# Patient Record
Sex: Female | Born: 1945 | Race: White | Hispanic: No | Smoking: Never smoker
Health system: Southern US, Community
[De-identification: ages and names within clinical notes are randomized; demographics above are authoritative.]

## PROBLEM LIST (undated history)

## (undated) ENCOUNTER — Ambulatory Visit

## (undated) DIAGNOSIS — E785 Hyperlipidemia, unspecified: Secondary | ICD-10-CM

## (undated) DIAGNOSIS — I10 Essential (primary) hypertension: Secondary | ICD-10-CM

## (undated) DIAGNOSIS — E119 Type 2 diabetes mellitus without complications: Secondary | ICD-10-CM

## (undated) HISTORY — DX: Type 2 diabetes mellitus without complications: E11.9

## (undated) HISTORY — DX: Essential (primary) hypertension: I10

## (undated) HISTORY — DX: Hyperlipidemia, unspecified: E78.5

---

## 1998-05-22 ENCOUNTER — Encounter: Admission: RE | Admit: 1998-05-22 | Discharge: 1998-05-22 | Payer: Self-pay | Admitting: *Deleted

## 1998-05-22 ENCOUNTER — Emergency Department (HOSPITAL_COMMUNITY): Admission: EM | Admit: 1998-05-22 | Discharge: 1998-05-22 | Payer: Self-pay

## 1998-05-23 ENCOUNTER — Encounter: Admission: RE | Admit: 1998-05-23 | Discharge: 1998-08-21 | Payer: Self-pay | Admitting: *Deleted

## 1998-05-31 ENCOUNTER — Encounter: Admission: RE | Admit: 1998-05-31 | Discharge: 1998-08-29 | Payer: Self-pay | Admitting: *Deleted

## 2012-05-04 LAB — HM DEXA SCAN: HM Dexa Scan: NORMAL

## 2014-08-14 LAB — HM MAMMOGRAPHY

## 2014-09-24 ENCOUNTER — Ambulatory Visit (INDEPENDENT_AMBULATORY_CARE_PROVIDER_SITE_OTHER): Payer: Commercial Managed Care - HMO | Admitting: Family Medicine

## 2014-09-24 ENCOUNTER — Encounter: Payer: Self-pay | Admitting: Family Medicine

## 2014-09-24 VITALS — BP 125/53 | HR 77 | Temp 97.5°F | Ht 61.5 in | Wt 170.0 lb

## 2014-09-24 DIAGNOSIS — H409 Unspecified glaucoma: Secondary | ICD-10-CM | POA: Insufficient documentation

## 2014-09-24 DIAGNOSIS — E039 Hypothyroidism, unspecified: Secondary | ICD-10-CM | POA: Insufficient documentation

## 2014-09-24 DIAGNOSIS — E119 Type 2 diabetes mellitus without complications: Secondary | ICD-10-CM | POA: Diagnosis not present

## 2014-09-24 DIAGNOSIS — E038 Other specified hypothyroidism: Secondary | ICD-10-CM | POA: Diagnosis not present

## 2014-09-24 DIAGNOSIS — H9193 Unspecified hearing loss, bilateral: Secondary | ICD-10-CM

## 2014-09-24 DIAGNOSIS — E785 Hyperlipidemia, unspecified: Secondary | ICD-10-CM | POA: Insufficient documentation

## 2014-09-24 DIAGNOSIS — E1165 Type 2 diabetes mellitus with hyperglycemia: Secondary | ICD-10-CM | POA: Insufficient documentation

## 2014-09-24 DIAGNOSIS — K219 Gastro-esophageal reflux disease without esophagitis: Secondary | ICD-10-CM | POA: Insufficient documentation

## 2014-09-24 DIAGNOSIS — Z8249 Family history of ischemic heart disease and other diseases of the circulatory system: Secondary | ICD-10-CM

## 2014-09-24 DIAGNOSIS — H9190 Unspecified hearing loss, unspecified ear: Secondary | ICD-10-CM | POA: Insufficient documentation

## 2014-09-24 DIAGNOSIS — IMO0002 Reserved for concepts with insufficient information to code with codable children: Secondary | ICD-10-CM | POA: Insufficient documentation

## 2014-09-24 DIAGNOSIS — I10 Essential (primary) hypertension: Secondary | ICD-10-CM

## 2014-09-24 NOTE — Progress Notes (Signed)
   Subjective:    Patient ID: Yolanda Fields, female    DOB: 12-24-1945, 69 y.o.   MRN: [id number]  HPI Diabetes - no hypoglycemic events. No wounds or sores that are not healing well. No increased thirst or urination. Checking glucose at home. Taking medications as prescribed without any side effects. Hasn't been exercising as much. She's currently on a commission dry to metformin. Her insurance will no longer cover this but they will cover the comminution of glipizide and metformin.  Hypertension- Pt denies chest pain, SOB, dizziness, or heart palpitations.  Taking meds as directed w/o problems.  Denies medication side effects.  She is on amlodipine 10 mg, Lasix 40 mg as needed and losartan 25 mg daily and metoprolol 25 mg.  She says that when she saw her doctor over the summer they have discussed the fact that her brother had a abdominal aneurysm which required surgery and he eventually died postop. She would like to be screened for this. She has never been a smoker.  Hypothyroid - has been on thyroid medication for 3 days.    Glaucoma- she is followedy by Dr. Kenton Fields and is on drops.    Hearing loss -wears hearing aid.    Review of Systems     Objective:   Physical Exam  Constitutional: She is oriented to person, place, and time. She appears well-developed and well-nourished.  HENT:  Head: Normocephalic and atraumatic.  Cardiovascular: Normal rate, regular rhythm and normal heart sounds.   No carotid bruits  Pulmonary/Chest: Effort normal and breath sounds normal.  Musculoskeletal: She exhibits no edema.  Neurological: She is alert and oriented to person, place, and time.  Skin: Skin is warm and dry.  Psychiatric: She has a normal mood and affect. Her behavior is normal.          Assessment & Plan:  DM- Still takes her cinnamon.  She is on statin and ARB. She said her last A1c was about 2 months ago. We will go through her labs and records to get a copy of this. She says it  was okay but up a little bit from previous. I will see her back in about 6 weeks for the next A1c.  HTN - well controlled. Continue current regimen.  Hypothyroid - well controlled on current regimen.  She's been getting her thyroid checked every 6 months. Next  Hearing loss-she does wear hearing aids and had her left one in today.  Glaucoma-she's followed by Dr. Aline Fields currently uses drops. She is also being followed for cataracts as well.  Family history of abdominal aneurysm-will screen with ultrasound the aorta and consider echocardiogram as well.

## 2014-09-25 ENCOUNTER — Encounter: Payer: Self-pay | Admitting: Family Medicine

## 2014-09-25 DIAGNOSIS — I10 Essential (primary) hypertension: Secondary | ICD-10-CM | POA: Insufficient documentation

## 2014-09-25 MED ORDER — GLIPIZIDE-METFORMIN HCL 5-500 MG PO TABS: 1.0000 | ORAL_TABLET | Freq: Two times a day (BID) | ORAL | Status: DC

## 2014-11-05 ENCOUNTER — Encounter: Payer: Self-pay | Admitting: Family Medicine

## 2014-11-05 ENCOUNTER — Ambulatory Visit (INDEPENDENT_AMBULATORY_CARE_PROVIDER_SITE_OTHER): Payer: Commercial Managed Care - HMO | Admitting: Family Medicine

## 2014-11-05 ENCOUNTER — Other Ambulatory Visit: Payer: Self-pay | Admitting: *Deleted

## 2014-11-05 VITALS — BP 122/78 | HR 110 | Ht 62.0 in | Wt 168.0 lb

## 2014-11-05 DIAGNOSIS — E038 Other specified hypothyroidism: Secondary | ICD-10-CM | POA: Diagnosis not present

## 2014-11-05 DIAGNOSIS — R110 Nausea: Secondary | ICD-10-CM

## 2014-11-05 DIAGNOSIS — R42 Dizziness and giddiness: Secondary | ICD-10-CM

## 2014-11-05 DIAGNOSIS — I10 Essential (primary) hypertension: Secondary | ICD-10-CM

## 2014-11-05 DIAGNOSIS — E119 Type 2 diabetes mellitus without complications: Secondary | ICD-10-CM | POA: Diagnosis not present

## 2014-11-05 DIAGNOSIS — E785 Hyperlipidemia, unspecified: Secondary | ICD-10-CM | POA: Diagnosis not present

## 2014-11-05 LAB — COMPLETE METABOLIC PANEL WITH GFR
ALT: 11 U/L (ref 0–35)
AST: 14 U/L (ref 0–37)
Albumin: 4.2 g/dL (ref 3.5–5.2)
Alkaline Phosphatase: 85 U/L (ref 39–117)
BUN: 31 mg/dL — ABNORMAL HIGH (ref 6–23)
CO2: 25 mEq/L (ref 19–32)
Calcium: 9.4 mg/dL (ref 8.4–10.5)
Chloride: 100 mEq/L (ref 96–112)
Creat: 1.19 mg/dL — ABNORMAL HIGH (ref 0.50–1.10)
GFR, Est African American: 53 mL/min — ABNORMAL LOW
GFR, Est Non African American: 46 mL/min — ABNORMAL LOW
Glucose, Bld: 218 mg/dL — ABNORMAL HIGH (ref 70–99)
Potassium: 4.3 mEq/L (ref 3.5–5.3)
Sodium: 136 mEq/L (ref 135–145)
Total Bilirubin: 1.4 mg/dL — ABNORMAL HIGH (ref 0.2–1.2)
Total Protein: 7 g/dL (ref 6.0–8.3)

## 2014-11-05 LAB — POCT GLYCOSYLATED HEMOGLOBIN (HGB A1C): Hemoglobin A1C: 9

## 2014-11-05 LAB — POCT UA - MICROALBUMIN
Albumin/Creatinine Ratio, Urine, POC: <30
Creatinine, POC: 200 mg/dL
Microalbumin Ur, POC: 30 mg/L

## 2014-11-05 MED ORDER — LOSARTAN POTASSIUM 100 MG PO TABS: 100.0000 mg | ORAL_TABLET | Freq: Every day | ORAL | Status: DC

## 2014-11-05 MED ORDER — AMLODIPINE BESYLATE 10 MG PO TABS: 10.0000 mg | ORAL_TABLET | Freq: Every day | ORAL | Status: DC
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
## 2014-11-06 ENCOUNTER — Other Ambulatory Visit: Payer: Self-pay | Admitting: Family Medicine

## 2014-11-06 ENCOUNTER — Encounter: Payer: Self-pay | Admitting: Family Medicine

## 2014-11-06 ENCOUNTER — Ambulatory Visit: Payer: Commercial Managed Care - HMO

## 2014-11-06 DIAGNOSIS — N183 Chronic kidney disease, stage 3 (moderate): Secondary | ICD-10-CM

## 2014-11-06 DIAGNOSIS — N1831 Chronic kidney disease, stage 3a: Secondary | ICD-10-CM | POA: Insufficient documentation

## 2014-11-06 DIAGNOSIS — N1832 Chronic kidney disease, stage 3b: Secondary | ICD-10-CM | POA: Insufficient documentation

## 2014-11-06 LAB — TSH: TSH: 2.083 u[IU]/mL (ref 0.350–4.500)

## 2014-11-06 MED ORDER — DULAGLUTIDE 0.75 MG/0.5ML ~~LOC~~ SOAJ: 0.7500 mg | SUBCUTANEOUS | Status: DC

## 2014-11-07 ENCOUNTER — Ambulatory Visit (INDEPENDENT_AMBULATORY_CARE_PROVIDER_SITE_OTHER): Payer: Commercial Managed Care - HMO

## 2014-11-07 ENCOUNTER — Telehealth: Payer: Self-pay | Admitting: Family Medicine

## 2014-11-07 DIAGNOSIS — J320 Chronic maxillary sinusitis: Secondary | ICD-10-CM | POA: Diagnosis not present
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
## 2014-11-08 ENCOUNTER — Other Ambulatory Visit: Payer: Self-pay | Admitting: Family Medicine

## 2014-11-08 MED ORDER — DOXYCYCLINE HYCLATE 100 MG PO TABS: 100.0000 mg | ORAL_TABLET | Freq: Two times a day (BID) | ORAL | Status: DC

## 2014-11-28 ENCOUNTER — Other Ambulatory Visit: Payer: Self-pay | Admitting: Family Medicine

## 2014-11-28 MED ORDER — ATORVASTATIN CALCIUM 10 MG PO TABS: 10.0000 mg | ORAL_TABLET | Freq: Every day | ORAL | Status: DC

## 2015-01-08 ENCOUNTER — Other Ambulatory Visit: Payer: Self-pay | Admitting: Family Medicine

## 2015-02-04 ENCOUNTER — Ambulatory Visit (INDEPENDENT_AMBULATORY_CARE_PROVIDER_SITE_OTHER): Payer: Commercial Managed Care - HMO | Admitting: Family Medicine

## 2015-02-04 ENCOUNTER — Encounter: Payer: Self-pay | Admitting: Family Medicine

## 2015-02-04 VITALS — BP 136/72 | HR 106 | Ht 62.0 in | Wt 160.0 lb

## 2015-02-04 DIAGNOSIS — I10 Essential (primary) hypertension: Secondary | ICD-10-CM | POA: Diagnosis not present

## 2015-02-04 DIAGNOSIS — E1165 Type 2 diabetes mellitus with hyperglycemia: Secondary | ICD-10-CM | POA: Diagnosis not present

## 2015-02-04 DIAGNOSIS — E538 Deficiency of other specified B group vitamins: Secondary | ICD-10-CM | POA: Diagnosis not present

## 2015-02-04 DIAGNOSIS — E559 Vitamin D deficiency, unspecified: Secondary | ICD-10-CM

## 2015-02-04 DIAGNOSIS — IMO0002 Reserved for concepts with insufficient information to code with codable children: Secondary | ICD-10-CM

## 2015-02-04 LAB — BASIC METABOLIC PANEL WITH GFR
BUN: 26 mg/dL — ABNORMAL HIGH (ref 6–23)
CO2: 24 mEq/L (ref 19–32)
Calcium: 9.2 mg/dL (ref 8.4–10.5)
Chloride: 97 mEq/L (ref 96–112)
Creat: 1.18 mg/dL — ABNORMAL HIGH (ref 0.50–1.10)
GFR, Est African American: 54 mL/min — ABNORMAL LOW
GFR, Est Non African American: 47 mL/min — ABNORMAL LOW
Glucose, Bld: 391 mg/dL — ABNORMAL HIGH (ref 70–99)
Potassium: 4.6 mEq/L (ref 3.5–5.3)
Sodium: 132 mEq/L — ABNORMAL LOW (ref 135–145)

## 2015-02-04 LAB — POCT GLYCOSYLATED HEMOGLOBIN (HGB A1C): Hemoglobin A1C: 10.1

## 2015-02-04 LAB — HEMOGLOBIN A1C
Hgb A1c MFr Bld: 10.5 % — ABNORMAL HIGH (ref <5.7)
Mean Plasma Glucose: 255 mg/dL — ABNORMAL HIGH (ref <117)

## 2015-02-04 LAB — VITAMIN B12: Vitamin B-12: 623 pg/mL (ref 211–911)

## 2015-02-04 MED ORDER — EMPAGLIFLOZIN 10 MG PO TABS: 10.0000 mg | ORAL_TABLET | Freq: Every day | ORAL | Status: DC

## 2015-02-04 MED ORDER — FUROSEMIDE 40 MG PO TABS: 40.0000 mg | ORAL_TABLET | Freq: Every day | ORAL | Status: DC

## 2015-02-04 MED ORDER — DULAGLUTIDE 0.75 MG/0.5ML ~~LOC~~ SOAJ: 1.5000 mg | SUBCUTANEOUS | Status: DC

## 2015-02-04 MED ORDER — SITAGLIPTIN PHOS-METFORMIN HCL 50-1000 MG PO TABS: 1.0000 | ORAL_TABLET | Freq: Two times a day (BID) | ORAL | Status: DC

## 2015-02-04 NOTE — Progress Notes (Signed)
   Subjective:    Patient ID: Yolanda Fields, female    DOB: [birth year], 69 y.o.   MRN: [id number]  HPI Diabetes - no hypoglycemic events. No wounds or sores that are not healing well. No increased thirst or urination. Checking glucose at home. Taking medications as prescribed without any side effects. Last visit was started on Trulicity.  On metformin, glipizide and januvia.   Lab Results  Component Value Date   HGBA1C 9.0 11/05/2014     Hypertension- Pt denies chest pain, SOB, dizziness, or heart palpitations.  Taking meds as directed w/o problems.  Denies medication side effects.    She would also like to have her vitamin D and vitamin B12 levels checked. She was on supplementation for vitamin D for a while and was able to bring her level up some but hasn't checked it in a while. Also for B12 deficiency she tried oral supplementation but did not up sore but very well so she was put on injections. It has been months since she has had a B12 shot.  Review of Systems     Objective:   Physical Exam  Constitutional: She is oriented to person, place, and time. She appears well-developed and well-nourished.  HENT:  Head: Normocephalic and atraumatic.  Cardiovascular: Normal rate, regular rhythm and normal heart sounds.   No carotid bruits.   Pulmonary/Chest: Effort normal and breath sounds normal.  Neurological: She is alert and oriented to person, place, and time.  Skin: Skin is warm and dry.  Psychiatric: She has a normal mood and affect. Her behavior is normal.          Assessment & Plan:  DM- Uncontrolled but she had also stopped the metformin/glipizide. I think there was a misunderstanding. Will  Change to Janumet and add Jardiance. Kidney function is borderline to take the metformin and Jardiance.  She is on an ARB, and statin. F/U in 3 months.    HTN- well controlled. Continue current regimen.  Vitamin D deficiency-we'll recheck level today. Next  Vitamin B12  deficiency-we'll recheck level today and call with results. May need to restart injections or put back on a regular schedule if she does not absorb B12 through the gut very well.

## 2015-02-05 LAB — VITAMIN D 25 HYDROXY (VIT D DEFICIENCY, FRACTURES): Vit D, 25-Hydroxy: 31 ng/mL (ref 30–100)

## 2015-02-20 ENCOUNTER — Encounter: Payer: Self-pay | Admitting: *Deleted

## 2015-03-13 ENCOUNTER — Telehealth: Payer: Self-pay | Admitting: Family Medicine

## 2015-03-13 DIAGNOSIS — J0121 Acute recurrent ethmoidal sinusitis: Secondary | ICD-10-CM

## 2015-03-13 NOTE — Telephone Encounter (Signed)
Ok to place referra.  

## 2015-03-13 NOTE — Telephone Encounter (Signed)
Patient went to [facility] and seen Dr.[name] Vevelyn Royals for EAR CHECK with Sinus and she didn't realize with her new [employer] she needs a referral submitted to [facility].

## 2015-03-14 NOTE — Telephone Encounter (Signed)
Referral placed.

## 2015-05-07 ENCOUNTER — Other Ambulatory Visit: Payer: Self-pay | Admitting: Family Medicine

## 2015-05-07 ENCOUNTER — Ambulatory Visit (INDEPENDENT_AMBULATORY_CARE_PROVIDER_SITE_OTHER): Payer: Commercial Managed Care - HMO | Admitting: Family Medicine

## 2015-05-07 ENCOUNTER — Encounter: Payer: Self-pay | Admitting: Family Medicine

## 2015-05-07 VITALS — BP 133/66 | HR 105 | Wt 160.0 lb

## 2015-05-07 DIAGNOSIS — N1832 Chronic kidney disease, stage 3b: Secondary | ICD-10-CM

## 2015-05-07 DIAGNOSIS — I10 Essential (primary) hypertension: Secondary | ICD-10-CM | POA: Diagnosis not present

## 2015-05-07 DIAGNOSIS — E871 Hypo-osmolality and hyponatremia: Secondary | ICD-10-CM | POA: Diagnosis not present

## 2015-05-07 DIAGNOSIS — E119 Type 2 diabetes mellitus without complications: Secondary | ICD-10-CM

## 2015-05-07 DIAGNOSIS — Z01419 Encounter for gynecological examination (general) (routine) without abnormal findings: Secondary | ICD-10-CM

## 2015-05-07 DIAGNOSIS — Z8249 Family history of ischemic heart disease and other diseases of the circulatory system: Secondary | ICD-10-CM

## 2015-05-07 DIAGNOSIS — N183 Chronic kidney disease, stage 3 (moderate): Secondary | ICD-10-CM

## 2015-05-07 LAB — HEMOGLOBIN A1C
Hgb A1c MFr Bld: 11.1 % — ABNORMAL HIGH (ref <5.7)
Mean Plasma Glucose: 272 mg/dL — ABNORMAL HIGH (ref <117)

## 2015-05-07 LAB — SODIUM: Sodium: 135 mmol/L (ref 135–146)

## 2015-05-07 LAB — MAGNESIUM: Magnesium: 1.5 mg/dL (ref 1.5–2.5)

## 2015-05-07 NOTE — Progress Notes (Signed)
   Subjective:    Patient ID: Yolanda Fields, female    DOB: 1945/11/30, 69 y.o.   MRN: [id number]  HPI Diabetes - no hypoglycemic events. No wounds or sores that are not healing well. No increased thirst or urination. Checking glucose at home. Taking medications as prescribed without any side effects.Says the  Lab Results  Component Value Date   HGBA1C 10.5* 02/04/2015    Hypertension- Pt denies chest pain, SOB, dizziness, or heart palpitations.  Taking meds as directed w/o problems.  Denies medication side effects.    Review of Systems     Objective:   Physical Exam  Constitutional: She is oriented to person, place, and time. She appears well-developed and well-nourished.  HENT:  Head: Normocephalic and atraumatic.  Cardiovascular: Normal rate, regular rhythm and normal heart sounds.   Pulmonary/Chest: Effort normal and breath sounds normal.  Neurological: She is alert and oriented to person, place, and time.  Skin: Skin is warm and dry.  Psychiatric: She has a normal mood and affect. Her behavior is normal.          Assessment & Plan:  DM - uncontrolled. She's only been on the Jardiance for about a month and was having some difficulty with the Trulicity but says she now feels comfortable with it. I like to give it about 4 more weeks before he recheck an A1c just make sure that it's coming down and that her blood sugar levels are normal coming back into the normal range.  It's important to get this under control to prevent further damage to her kidneys.  Flu shot done at [facility].  Eye was done last week at [facility]. Will call to get report   HTN - well controlled. Continue to monitor.   CKD 3  - she is stable. Will continue to =monitor every 6 months.    Hyponatremia - will recheck today. She wants her magnesium checked.

## 2015-05-07 NOTE — Addendum Note (Signed)
Addended by: Teddy Spike on: 05/07/2015 11:56 AM   Modules accepted: Orders

## 2015-05-10 ENCOUNTER — Ambulatory Visit (HOSPITAL_BASED_OUTPATIENT_CLINIC_OR_DEPARTMENT_OTHER)
Admission: RE | Admit: 2015-05-10 | Discharge: 2015-05-10 | Disposition: A | Discharge status: Home / Self Care | Payer: Commercial Managed Care - HMO | Source: Ambulatory Visit | Attending: Family Medicine | Admitting: Family Medicine

## 2015-05-10 DIAGNOSIS — I10 Essential (primary) hypertension: Secondary | ICD-10-CM | POA: Diagnosis not present

## 2015-05-10 DIAGNOSIS — Z136 Encounter for screening for cardiovascular disorders: Secondary | ICD-10-CM | POA: Diagnosis not present

## 2015-05-10 DIAGNOSIS — I77811 Abdominal aortic ectasia: Secondary | ICD-10-CM | POA: Insufficient documentation

## 2015-05-10 DIAGNOSIS — Z8249 Family history of ischemic heart disease and other diseases of the circulatory system: Secondary | ICD-10-CM

## 2015-05-15 ENCOUNTER — Other Ambulatory Visit: Payer: Self-pay | Admitting: Family Medicine

## 2015-05-15 ENCOUNTER — Ambulatory Visit (HOSPITAL_BASED_OUTPATIENT_CLINIC_OR_DEPARTMENT_OTHER)
Admission: RE | Admit: 2015-05-15 | Discharge: 2015-05-15 | Disposition: A | Discharge status: Home / Self Care | Payer: Commercial Managed Care - HMO | Source: Ambulatory Visit | Attending: Family Medicine | Admitting: Family Medicine

## 2015-05-15 DIAGNOSIS — I340 Nonrheumatic mitral (valve) insufficiency: Secondary | ICD-10-CM | POA: Insufficient documentation

## 2015-05-15 DIAGNOSIS — E119 Type 2 diabetes mellitus without complications: Secondary | ICD-10-CM | POA: Diagnosis not present

## 2015-05-15 DIAGNOSIS — I10 Essential (primary) hypertension: Secondary | ICD-10-CM | POA: Insufficient documentation

## 2015-05-15 DIAGNOSIS — Z8249 Family history of ischemic heart disease and other diseases of the circulatory system: Secondary | ICD-10-CM | POA: Insufficient documentation

## 2015-05-15 DIAGNOSIS — E785 Hyperlipidemia, unspecified: Secondary | ICD-10-CM | POA: Insufficient documentation

## 2015-05-15 NOTE — Progress Notes (Signed)
  Echocardiogram 2D Echocardiogram has been performed.  Joelene Millin 05/15/2015, 2:55 PM

## 2015-05-24 ENCOUNTER — Other Ambulatory Visit: Payer: Self-pay | Admitting: Family Medicine

## 2015-05-30 ENCOUNTER — Telehealth: Payer: Self-pay | Admitting: *Deleted

## 2015-05-30 DIAGNOSIS — H9193 Unspecified hearing loss, bilateral: Secondary | ICD-10-CM

## 2015-05-30 NOTE — Telephone Encounter (Signed)
Pt's daughter called asking for a referral for ENT to be sent Dr. Lawson Radar w/[facility]. She was supposed to have had a referral starting back in June and has had some issues with [employer] taking care of this bill. Will speak with Jenny Reichmann or [name] regarding this to see if this can be taken care of.Yolanda Fields  Please use the cell # that is listed [phone].Yolanda Fields [place]

## 2015-06-04 ENCOUNTER — Other Ambulatory Visit: Payer: Self-pay | Admitting: Family Medicine

## 2015-07-18 ENCOUNTER — Other Ambulatory Visit: Payer: Self-pay | Admitting: Allergy

## 2015-07-31 ENCOUNTER — Other Ambulatory Visit: Payer: Self-pay | Admitting: Family Medicine

## 2015-08-07 ENCOUNTER — Ambulatory Visit: Payer: Commercial Managed Care - HMO | Admitting: Family Medicine

## 2015-08-23 ENCOUNTER — Ambulatory Visit: Payer: Commercial Managed Care - HMO | Admitting: Family Medicine

## 2015-08-26 ENCOUNTER — Ambulatory Visit (INDEPENDENT_AMBULATORY_CARE_PROVIDER_SITE_OTHER): Payer: Commercial Managed Care - HMO | Admitting: Family Medicine

## 2015-08-26 ENCOUNTER — Encounter: Payer: Self-pay | Admitting: Family Medicine

## 2015-08-26 VITALS — BP 152/55 | HR 102 | Wt 152.9 lb

## 2015-08-26 DIAGNOSIS — N760 Acute vaginitis: Secondary | ICD-10-CM | POA: Diagnosis not present

## 2015-08-26 DIAGNOSIS — N1832 Chronic kidney disease, stage 3b: Secondary | ICD-10-CM

## 2015-08-26 DIAGNOSIS — E038 Other specified hypothyroidism: Secondary | ICD-10-CM | POA: Diagnosis not present

## 2015-08-26 DIAGNOSIS — E119 Type 2 diabetes mellitus without complications: Secondary | ICD-10-CM | POA: Diagnosis not present

## 2015-08-26 DIAGNOSIS — N183 Chronic kidney disease, stage 3 (moderate): Secondary | ICD-10-CM | POA: Diagnosis not present

## 2015-08-26 LAB — WET PREP, GENITAL
Trich, Wet Prep: NONE SEEN
Yeast Wet Prep HPF POC: NONE SEEN

## 2015-08-26 NOTE — Patient Instructions (Signed)
Bring home blood sugar readings at your next office visit.

## 2015-08-26 NOTE — Progress Notes (Addendum)
   Subjective:    Patient ID: Yolanda Fields, female    DOB: 1945-08-26, 70 y.o.   MRN: [id number]  HPI Diabetes - no hypoglycemic events. No wounds or sores that are not healing well. No increased thirst or urination. Checking glucose at home. Taking medications as prescribed without any side effects. Though says she gets nauseated if she doesn't eat.   She reports that her eye exam is up-to-date. She was recently seen at [facility] on [address] in [place]. She is currently on Jardiance. She thinks she might have a yeast infection. She has had some vaginal itching.  She has similar symptoms about a month ago and saw her GYN and tested negative for any type of infection.  Hypothyroidism-no recent changes in skin or hair. No significant weight changes. No change in energy level. She is taking her medication regularly.  CKD 3-1 monitoring kidney function every 6 months. She denies any recent symptoms except for some vaginal discharge and irritation.  Review of Systems     Objective:   Physical Exam  Constitutional: She is oriented to person, place, and time. She appears well-developed and well-nourished.  HENT:  Head: Normocephalic and atraumatic.  Cardiovascular: Normal rate, regular rhythm and normal heart sounds.   Pulmonary/Chest: Effort normal and breath sounds normal.  Neurological: She is alert and oriented to person, place, and time.  Skin: Skin is warm and dry.  Psychiatric: She has a normal mood and affect. Her behavior is normal.          Assessment & Plan:  DM- Uncontrolled but improved.  Continue Janumet and Jardiance. She is on an ARB and a statin. She is on trulicity as well.  She has lost about 7 lbs.  She plans on starting back on her walking program.  She really needs to be started on insulin but declines to do so today. She really wants to continue to work on diet and weight loss and regular exercise. Follow-up in 3 months. Will call to get copy of eye  exam.  Hypothyroidism-due to recheck TSH. Will adjust if needed.  CKD 3-due to recheck kidney function. Due for screening lipid panel.  Vaginitis - self wet prep performed today.  All with results once available.  Wet prep was positive for bacterial vaginitis. Prescription sent for metronidazole 500 mg twice a day 7 days to the pharmacy.  Need for pneumonia vaccine.

## 2015-08-27 LAB — COMPLETE METABOLIC PANEL WITH GFR
ALT: 11 U/L (ref 6–29)
AST: 11 U/L (ref 10–35)
Albumin: 4 g/dL (ref 3.6–5.1)
Alkaline Phosphatase: 60 U/L (ref 33–130)
BUN: 30 mg/dL — ABNORMAL HIGH (ref 7–25)
CO2: 25 mmol/L (ref 20–31)
Calcium: 9.2 mg/dL (ref 8.6–10.4)
Chloride: 101 mmol/L (ref 98–110)
Creat: 0.97 mg/dL — ABNORMAL HIGH (ref 0.60–0.93)
GFR, Est African American: 68 mL/min (ref >=60)
GFR, Est Non African American: 59 mL/min — ABNORMAL LOW (ref >=60)
Glucose, Bld: 189 mg/dL — ABNORMAL HIGH (ref 65–99)
Potassium: 4.2 mmol/L (ref 3.5–5.3)
Sodium: 134 mmol/L — ABNORMAL LOW (ref 135–146)
Total Bilirubin: 1.1 mg/dL (ref 0.2–1.2)
Total Protein: 5.9 g/dL — ABNORMAL LOW (ref 6.1–8.1)

## 2015-08-27 LAB — LIPID PANEL
Cholesterol: 159 mg/dL (ref 125–200)
HDL: 40 mg/dL — ABNORMAL LOW (ref >=46)
LDL Cholesterol: 82 mg/dL (ref <130)
Total CHOL/HDL Ratio: 4 Ratio (ref <=5.0)
Triglycerides: 186 mg/dL — ABNORMAL HIGH (ref <150)
VLDL: 37 mg/dL — ABNORMAL HIGH (ref <30)

## 2015-08-27 LAB — TSH: TSH: 2.75 mIU/L

## 2015-08-28 ENCOUNTER — Encounter: Payer: Self-pay | Admitting: Family Medicine

## 2015-08-28 ENCOUNTER — Other Ambulatory Visit: Payer: Self-pay | Admitting: Family Medicine

## 2015-08-28 MED ORDER — METRONIDAZOLE 500 MG PO TABS: 500.0000 mg | ORAL_TABLET | Freq: Two times a day (BID) | ORAL | Status: DC

## 2015-08-28 NOTE — Addendum Note (Signed)
Addended by: [name] D on: 08/28/2015 07:46 AM   Modules accepted: Orders

## 2015-09-02 LAB — HM DIABETES EYE EXAM

## 2015-09-20 ENCOUNTER — Other Ambulatory Visit: Payer: Self-pay | Admitting: Family Medicine

## 2015-09-27 ENCOUNTER — Telehealth: Payer: Self-pay | Admitting: Family Medicine

## 2015-09-27 NOTE — Telephone Encounter (Signed)
Called patient: Received notification from her insurance company asking about whether or not you were on a statin because of your diagnosis of diabetes. Per our records you are on Lipitor 10 mg daily. We send in a year's worth prescription in May. I just wanted to verify that you were able to get your prescription and have been taking it and have not run into any problems.

## 2015-09-27 NOTE — Telephone Encounter (Signed)
Pt reports she does take the Lipitor and takes it daily with no issues.

## 2015-10-15 ENCOUNTER — Other Ambulatory Visit: Payer: Self-pay | Admitting: Family Medicine

## 2015-11-15 ENCOUNTER — Other Ambulatory Visit: Payer: Self-pay | Admitting: Family Medicine

## 2015-11-16 ENCOUNTER — Other Ambulatory Visit: Payer: Self-pay | Admitting: Family Medicine

## 2015-11-22 ENCOUNTER — Ambulatory Visit: Payer: Commercial Managed Care - HMO | Admitting: Family Medicine

## 2015-11-26 IMAGING — US US AORTA SCREENING (MEDICARE)
1 series · 12 of 12 positions shown · non-contrast
Comparison: None.

CLINICAL DATA: Hypertension.

EXAM:
ABDOMINAL AORTA SCREENING ULTRASOUND
TECHNIQUE: Ultrasound examination of the abdominal aorta was performed as a
screening evaluation for abdominal aortic aneurysm.

[Series 1: us aorta screening (medicare) · 0.28mm/px · 12 of 12 slices shown]
[im 1/12]
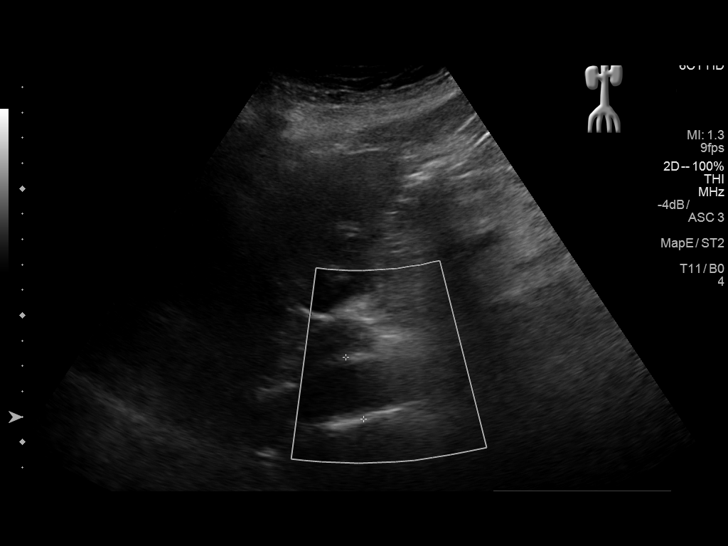
[im 2/12]
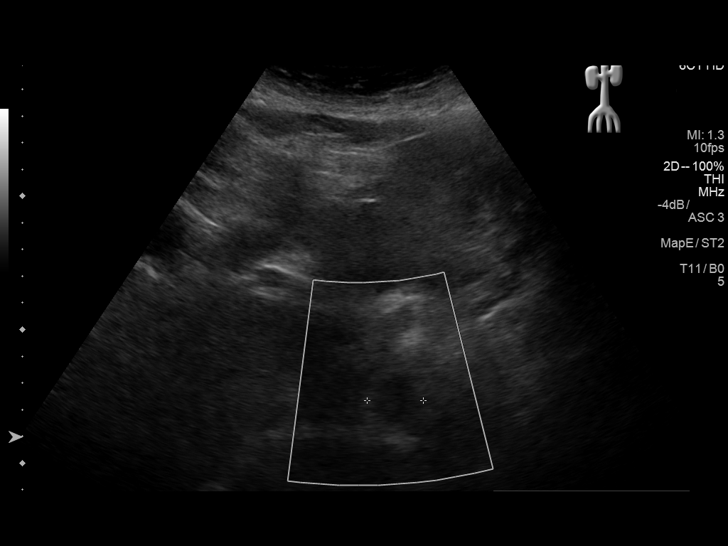
[im 3/12]
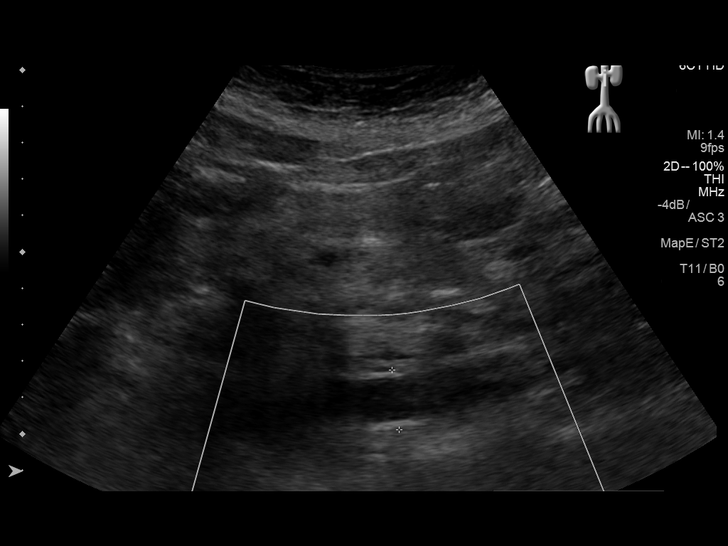
[im 4/12]
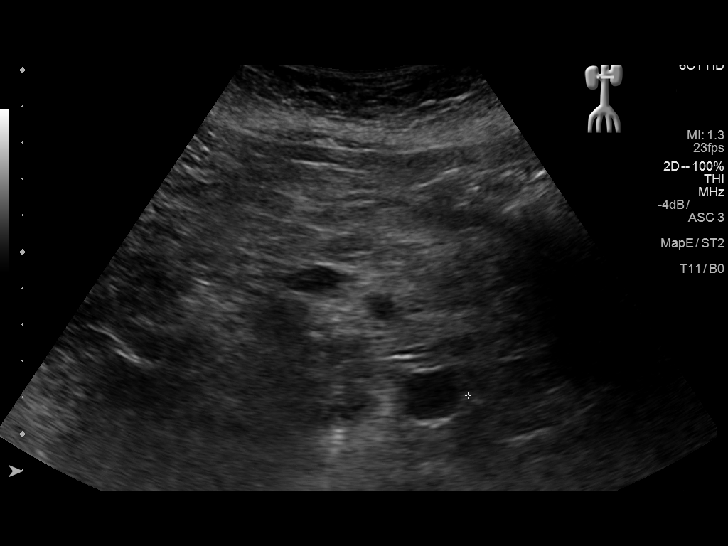
[im 5/12]
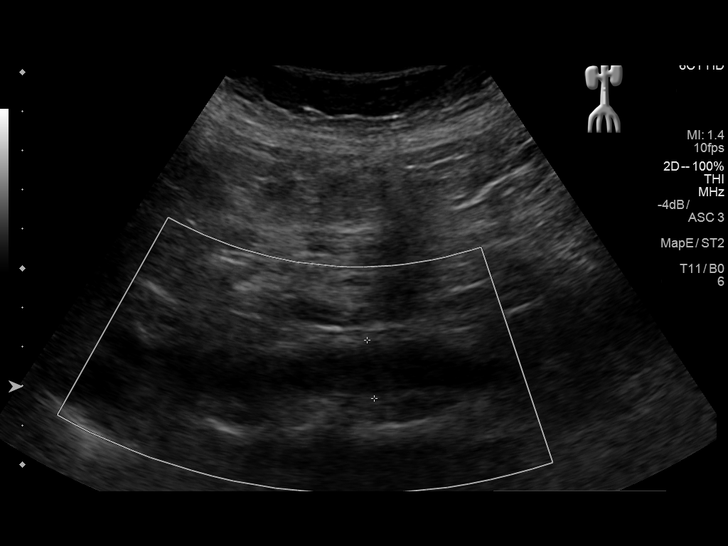
[im 6/12]
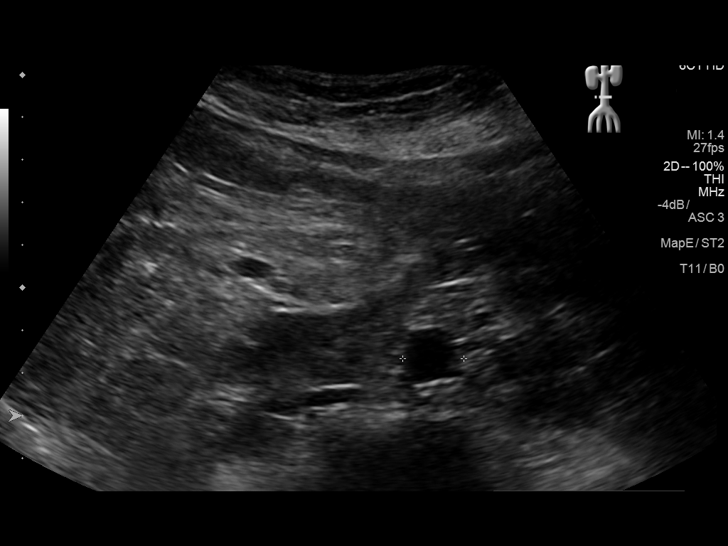
[im 7/12]
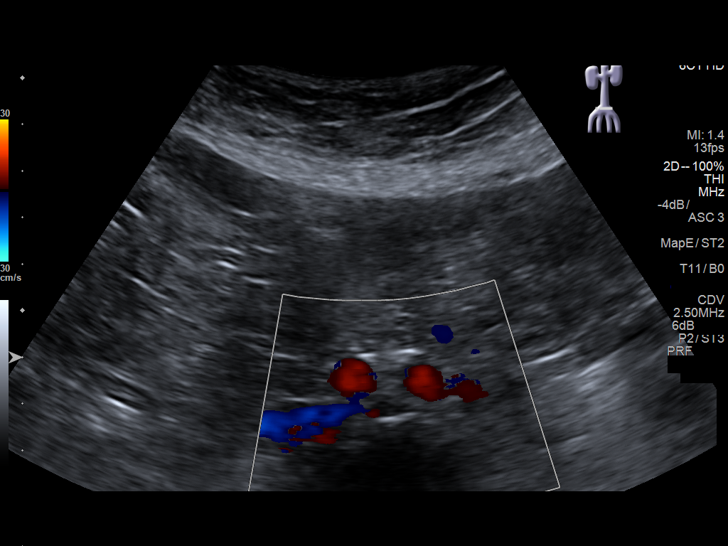
[im 8/12]
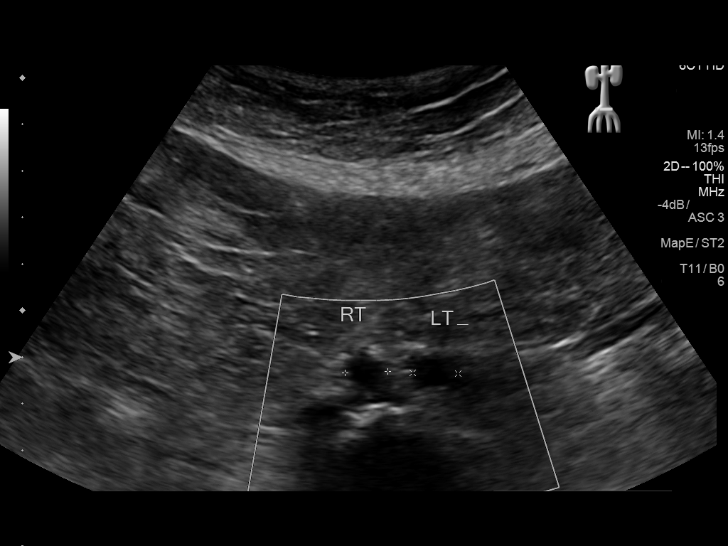
[im 9/12]
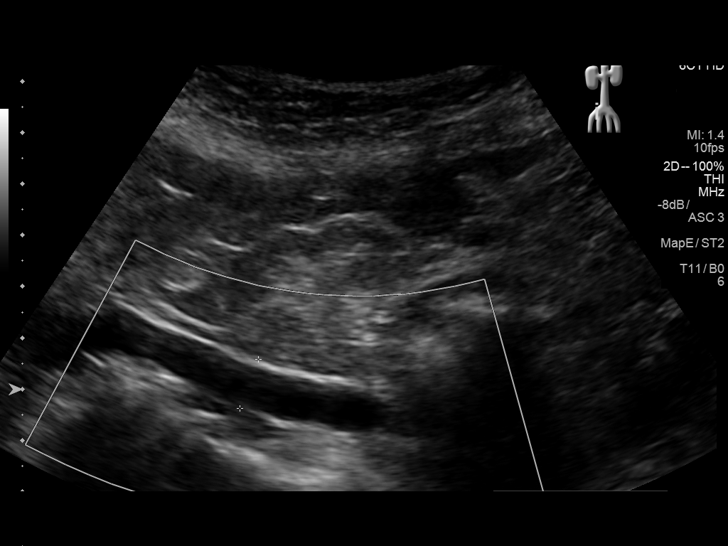
[im 10/12]
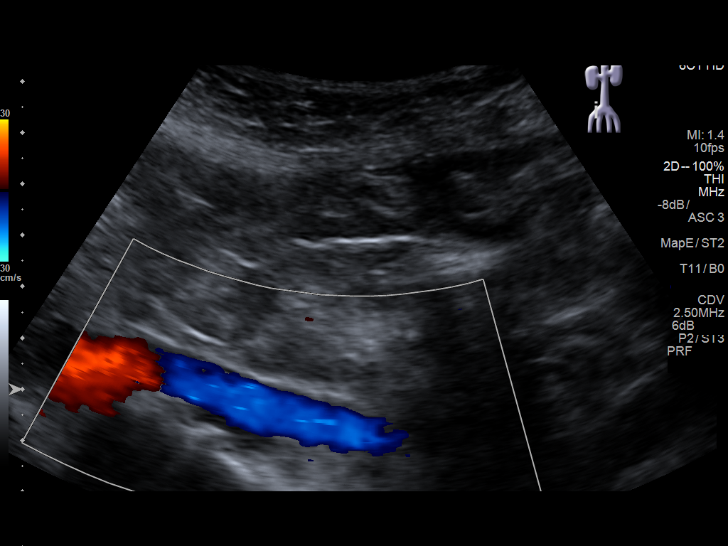
[im 11/12]
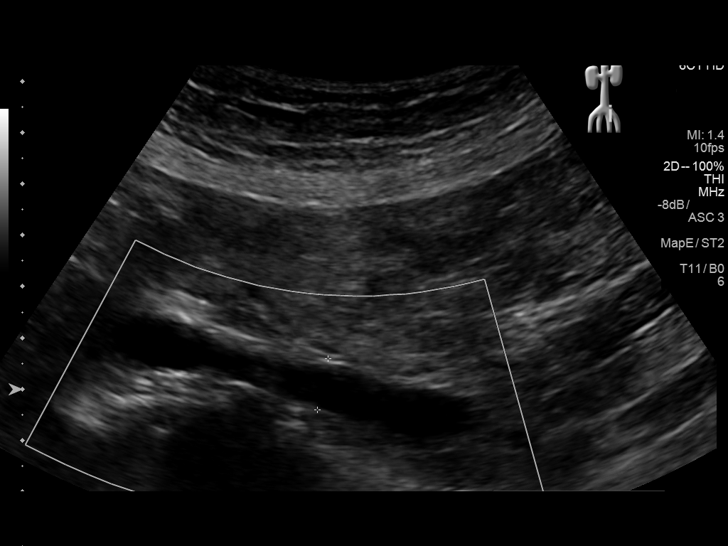
[im 12/12]
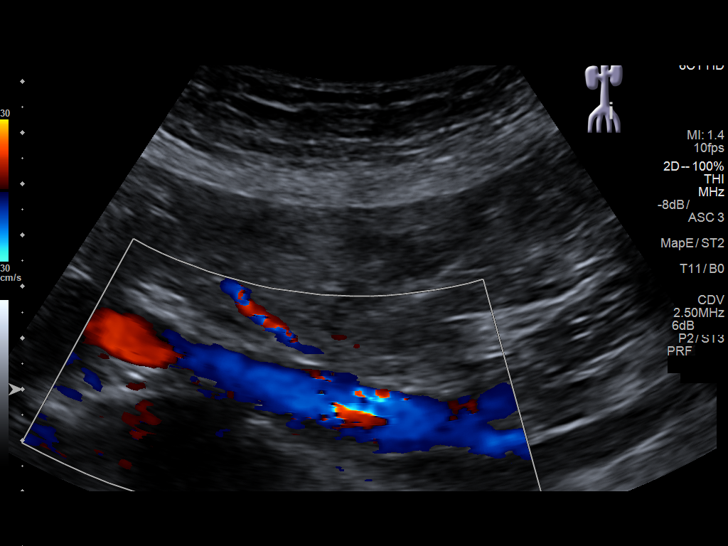

[12 of 12 positions shown; findings below may reference images not displayed]

FINDINGS: Abdominal Aorta

No evidence of aneurysm.  Mild ectasia to 2.5 cm.

Maximum Diameter: 2.5 cm
IMPRESSION: Mild abdominal aortic ectasia 2.5 cm. Ectatic abdominal aorta at
risk for aneurysm development. Recommend followup by ultrasound in 5
years. This recommendation follows ACR consensus guidelines: White
Paper of the ACR Incidental Findings Committee II on Vascular
Findings. [HOSPITAL] 4184; [DATE].

## 2015-12-03 ENCOUNTER — Ambulatory Visit: Payer: Commercial Managed Care - HMO | Admitting: Family Medicine

## 2015-12-11 ENCOUNTER — Other Ambulatory Visit: Payer: Self-pay | Admitting: Family Medicine

## 2015-12-26 ENCOUNTER — Ambulatory Visit (INDEPENDENT_AMBULATORY_CARE_PROVIDER_SITE_OTHER): Payer: Commercial Managed Care - HMO | Admitting: Family Medicine

## 2015-12-26 ENCOUNTER — Encounter: Payer: Self-pay | Admitting: Family Medicine

## 2015-12-26 ENCOUNTER — Other Ambulatory Visit: Payer: Self-pay | Admitting: Family Medicine

## 2015-12-26 VITALS — BP 122/62 | HR 99 | Wt 150.0 lb

## 2015-12-26 DIAGNOSIS — Z23 Encounter for immunization: Secondary | ICD-10-CM | POA: Diagnosis not present

## 2015-12-26 DIAGNOSIS — I10 Essential (primary) hypertension: Secondary | ICD-10-CM

## 2015-12-26 DIAGNOSIS — E119 Type 2 diabetes mellitus without complications: Secondary | ICD-10-CM | POA: Diagnosis not present

## 2015-12-26 LAB — POCT GLYCOSYLATED HEMOGLOBIN (HGB A1C): Hemoglobin A1C: 10.2

## 2015-12-26 MED ORDER — EMPAGLIFLOZIN 10 MG PO TABS: 10.0000 mg | ORAL_TABLET | Freq: Every day | ORAL | Status: DC

## 2015-12-26 MED ORDER — INSULIN DETEMIR 100 UNIT/ML FLEXPEN: 10.0000 [IU] | PEN_INJECTOR | Freq: Every day | SUBCUTANEOUS | Status: DC

## 2015-12-26 NOTE — Patient Instructions (Signed)
Levemir: Will start with 10 units at bedtime. After one week can increase by 1 unit every night until wanting blood sugar is running under 130.

## 2015-12-26 NOTE — Progress Notes (Signed)
Subjective:    CC: DM  HPI: Diabetes - no hypoglycemic events. No wounds or sores that are not healing well. No increased thirst or urination. Checking glucose at home. Taking medications as prescribed without any side effects.He has been working on her diet has actually lost 3 pounds since she was here last time.  Hypertension- Pt denies chest pain, SOB, dizziness, or heart palpitations.  Taking meds as directed w/o problems.  Denies medication side effects.  Patient reports that home blood pressures look great. She said the tunica she comes the doctor's office. She says she's taking her medication regularly.  Past medical history, Surgical history, Family history not pertinant except as noted below, Social history, Allergies, and medications have been entered into the medical record, reviewed, and corrections made.   Review of Systems: No fevers, chills, night sweats, weight loss, chest pain, or shortness of breath.   Objective:    General: Well Developed, well nourished, and in no acute distress.  Neuro: Alert and oriented x3, extra-ocular muscles intact, sensation grossly intact.  HEENT: Normocephalic, atraumatic  Skin: Warm and dry, no rashes. Cardiac: Regular rate and rhythm, no murmurs rubs or gallops, no lower extremity edema.  Respiratory: Clear to auscultation bilaterally. Not using accessory muscles, speaking in full sentences.   Impression and Recommendations:   DM- Uncontrolled.  Discussed options. At this point we really need to get her blood sugars under control and we are not managing that with the Janumet, Jardiance and Trulicity combination. We discussed starting Levemir. Will start with 10 units at bedtime. After one week can increase by 1 unit every night until wanting blood sugar is running under 130. Foot exam done today.  Lab Results  Component Value Date   HGBA1C 10.2 12/26/2015    HTN - Well controlled. Continue current regimen. Follow up in 3 months.   Given  Prevnar 13 today.

## 2015-12-27 ENCOUNTER — Other Ambulatory Visit: Payer: Self-pay | Admitting: Family Medicine

## 2015-12-27 ENCOUNTER — Telehealth: Payer: Self-pay

## 2015-12-27 ENCOUNTER — Other Ambulatory Visit: Payer: Self-pay | Admitting: *Deleted

## 2015-12-27 MED ORDER — AMLODIPINE BESYLATE 10 MG PO TABS: 10.0000 mg | ORAL_TABLET | Freq: Every day | ORAL | Status: DC

## 2015-12-27 MED ORDER — FUROSEMIDE 40 MG PO TABS: 40.0000 mg | ORAL_TABLET | Freq: Every day | ORAL | Status: DC | PRN

## 2015-12-27 MED ORDER — ESOMEPRAZOLE MAGNESIUM 40 MG PO CPDR: DELAYED_RELEASE_CAPSULE | ORAL | Status: DC

## 2015-12-27 MED ORDER — LEVOTHYROXINE SODIUM 100 MCG PO TABS: 100.0000 ug | ORAL_TABLET | Freq: Every day | ORAL | Status: DC

## 2015-12-27 MED ORDER — SITAGLIPTIN PHOS-METFORMIN HCL 50-1000 MG PO TABS: ORAL_TABLET | ORAL | Status: DC

## 2015-12-27 MED ORDER — ATORVASTATIN CALCIUM 10 MG PO TABS: 10.0000 mg | ORAL_TABLET | Freq: Every day | ORAL | Status: DC

## 2015-12-27 NOTE — Telephone Encounter (Signed)
Pharmacy advised  

## 2015-12-27 NOTE — Telephone Encounter (Signed)
Yes the insulin and trulicity.

## 2015-12-27 NOTE — Telephone Encounter (Signed)
[place] called and asked if patient is taking Levemir and Trulicity. Please advise.

## 2015-12-31 ENCOUNTER — Telehealth: Payer: Self-pay | Admitting: *Deleted

## 2015-12-31 NOTE — Telephone Encounter (Signed)
Pt called and lvm wanting to know if Dr. Madilyn Fireman would call something into her pharmacy for a nail infection. She was seen on 12/26/15. Nothing was mentioned in her chart regarding this.. I called her back to find out more had to lvm asking that she rtn call with more information and also advising that since this was not discussed at her OV that she would likely have to schedule an appt.Yolanda Fields

## 2016-01-02 ENCOUNTER — Encounter: Payer: Self-pay | Admitting: Family Medicine

## 2016-01-02 ENCOUNTER — Ambulatory Visit (INDEPENDENT_AMBULATORY_CARE_PROVIDER_SITE_OTHER): Payer: Commercial Managed Care - HMO | Admitting: Family Medicine

## 2016-01-02 VITALS — BP 159/79 | HR 92 | Temp 98.2°F

## 2016-01-02 DIAGNOSIS — L03011 Cellulitis of right finger: Secondary | ICD-10-CM

## 2016-01-02 MED ORDER — DOXYCYCLINE HYCLATE 100 MG PO TABS: 100.0000 mg | ORAL_TABLET | Freq: Two times a day (BID) | ORAL | Status: DC

## 2016-01-02 NOTE — Progress Notes (Addendum)
       Yolanda Fields is a 70 y.o. female who presents to [facility] today for paronychia right index finger present for 2 weeks worsening recently. She is use warm compress and has been instructed some pus. She denies any fevers or chills nausea vomiting or diarrhea. She feels well otherwise.   No past medical history on file. No past surgical history on file. Social History  Substance Use Topics  . Smoking status: Never Smoker   . Smokeless tobacco: Not on file  . Alcohol Use: No   family history includes Bladder Cancer in her brother; Cancer in her sister; Diabetes in her brother, mother, and sister; Heart failure in her brother and mother.  ROS as above:  Medications: Current Outpatient Prescriptions  Medication Sig Dispense Refill  . amLODipine (NORVASC) 10 MG tablet Take 1 tablet (10 mg total) by mouth daily. 90 tablet 1  . atorvastatin (LIPITOR) 10 MG tablet Take 1 tablet (10 mg total) by mouth at bedtime. 90 tablet 3  . Cholecalciferol 2000 UNITS CAPS Take by mouth.    Wallace Cullens POWD by Does not apply route.    . cyanocobalamin 1000 MCG tablet Take 1,000 mcg by mouth daily.    . empagliflozin (JARDIANCE) 10 MG TABS tablet Take 10 mg by mouth daily. 30 tablet 5  . esomeprazole (NEXIUM) 40 MG capsule TAKE ONE CAPSULE BY MOUTH EVERY MORNING BEFORE BREAKFAST AS DIRECTED 30 capsule 11  . furosemide (LASIX) 40 MG tablet Take 1 tablet (40 mg total) by mouth daily as needed. 30 tablet 5  . Insulin Detemir (LEVEMIR) 100 UNIT/ML Pen Inject 10-20 Units into the skin daily at 10 pm. 15 mL 5  . levothyroxine (SYNTHROID, LEVOTHROID) 100 MCG tablet Take 1 tablet (100 mcg total) by mouth daily. 90 tablet 1  . losartan (COZAAR) 100 MG tablet TAKE 1 TABLET BY MOUTH ONCE DAILY 90 tablet 0  . metoprolol succinate (TOPROL-XL) 25 MG 24 hr tablet Take 25 mg by mouth daily.     . Multiple Vitamin (MULTIVITAMIN) tablet Take 1 tablet by mouth daily.    . sitaGLIPtin-metformin (JANUMET) 50-1000 MG tablet TAKE ONE TABLET BY MOUTH 2 TIMES A DAY WITH A MEAL 60 tablet 5  . travoprost, benzalkonium, (TRAVATAN) 0.004 % ophthalmic solution 1 drop at bedtime.    . TRULICITY 1.5 0000000 SOPN INJECT 1.5MG  INTO THE SKIN ONCE A WEEK 2 mL 0  . doxycycline (VIBRA-TABS) 100 MG tablet Take 1 tablet (100 mg total) by mouth 2 (two) times daily. 14 tablet 0   No current facility-administered medications for this visit.   Allergies  Allergen Reactions  . Amoxicillin-Pot Clavulanate Swelling     Exam:  BP 159/79 mmHg  Pulse 92  Temp(Src) 98.2 F (36.8 C) (Oral)  Wt   SpO2 97% Gen: Well NAD Right index finger radial side erythematous and tender with no fluctuance. No expresses pus.  No results found for this or any previous visit (from the past 24 hour(s)). No results found.    Assessment and Plan: 70 y.o. female with paronychia not quite ready for incision and drainage. Treatment with oral doxycycline. Follow up with PCP as needed.  Discussed warning signs or symptoms. Please see discharge instructions. Patient expresses understanding.

## 2016-01-02 NOTE — Addendum Note (Signed)
Addended by: Darla Lesches T on: 01/02/2016 01:36 PM   Modules accepted: Medications

## 2016-01-02 NOTE — Patient Instructions (Signed)
Thank you for coming in today. Return as needed.  Continue warm water soaks.   Paronychia Paronychia is an infection of the skin that surrounds a nail. It usually affects the skin around a fingernail, but it may also occur near a toenail. It often causes pain and swelling around the nail. This condition may come on suddenly or develop over a longer period. In some cases, a collection of pus (abscess) can form near or under the nail. Usually, paronychia is not serious and it clears up with treatment. CAUSES This condition may be caused by bacteria or fungi. It is commonly caused by either Streptococcus or Staphylococcus bacteria. The bacteria or fungi often cause the infection by getting into the affected area through an opening in the skin, such as a cut or a hangnail. RISK FACTORS This condition is more likely to develop in:  People who get their hands wet often, such as those who work as Designer, industrial/product, bartenders, or nurses.  People who bite their fingernails or suck their thumbs.  People who trim their nails too short.  People who have hangnails or injured fingertips.  People who get manicures.  People who have diabetes. SYMPTOMS Symptoms of this condition include:  Redness and swelling of the skin near the nail.  Tenderness around the nail when you touch the area.  Pus-filled bumps under the cuticle. The cuticle is the skin at the base or sides of the nail.  Fluid or pus under the nail.  Throbbing pain in the area. DIAGNOSIS This condition is usually diagnosed with a physical exam. In some cases, a sample of pus may be taken from an abscess to be tested in a lab. This can help to determine what type of bacteria or fungi is causing the condition. TREATMENT Treatment for this condition depends on the cause and severity of the condition. If the condition is mild, it may clear up on its own in a few days. Your health care provider may recommend soaking the affected area in warm  water a few times a day. When treatment is needed, the options may include:  Antibiotic medicine, if the condition is caused by a bacterial infection.  Antifungal medicine, if the condition is caused by a fungal infection.  Incision and drainage, if an abscess is present. In this procedure, the health care provider will cut open the abscess so the pus can drain out. HOME CARE INSTRUCTIONS  Soak the affected area in warm water if directed to do so by your health care provider. You may be told to do this for 20 minutes, 2-3 times a day. Keep the area dry in between soakings.  Take medicines only as directed by your health care provider.  If you were prescribed an antibiotic medicine, finish all of it even if you start to feel better.  Keep the affected area clean.  Do not try to drain a fluid-filled bump yourself.  If you will be washing dishes or performing other tasks that require your hands to get wet, wear rubber gloves. You should also wear gloves if your hands might come in contact with irritating substances, such as cleaners or chemicals.  Follow your health care provider's instructions about:  Wound care.  Bandage (dressing) changes and removal. SEEK MEDICAL CARE IF:  Your symptoms get worse or do not improve with treatment.  You have a fever or chills.  You have redness spreading from the affected area.  You have continued or increased fluid, blood, or pus coming  from the affected area.  Your finger or knuckle becomes swollen or is difficult to move.   This information is not intended to replace advice given to you by your health care provider. Make sure you discuss any questions you have with your health care provider.   Document Released: 12/30/2000 Document Revised: 11/20/2014 Document Reviewed: 06/13/2014 Elsevier Interactive Patient Education [employer].

## 2016-01-02 NOTE — Addendum Note (Signed)
Addended by: Gregor Hams on: 01/02/2016 01:44 PM   Modules accepted: Orders, Level of Service

## 2016-01-16 DIAGNOSIS — Z1231 Encounter for screening mammogram for malignant neoplasm of breast: Secondary | ICD-10-CM | POA: Diagnosis not present

## 2016-01-16 LAB — HM MAMMOGRAPHY

## 2016-01-29 ENCOUNTER — Other Ambulatory Visit: Payer: Self-pay | Admitting: Family Medicine

## 2016-01-31 DIAGNOSIS — Z8719 Personal history of other diseases of the digestive system: Secondary | ICD-10-CM | POA: Diagnosis not present

## 2016-01-31 DIAGNOSIS — R8290 Unspecified abnormal findings in urine: Secondary | ICD-10-CM | POA: Diagnosis not present

## 2016-01-31 DIAGNOSIS — J209 Acute bronchitis, unspecified: Secondary | ICD-10-CM | POA: Diagnosis not present

## 2016-01-31 DIAGNOSIS — M545 Low back pain: Secondary | ICD-10-CM | POA: Diagnosis not present

## 2016-02-24 ENCOUNTER — Other Ambulatory Visit: Payer: Self-pay | Admitting: Family Medicine

## 2016-02-28 ENCOUNTER — Other Ambulatory Visit: Payer: Self-pay | Admitting: Family Medicine

## 2016-03-24 ENCOUNTER — Other Ambulatory Visit: Payer: Self-pay | Admitting: Family Medicine

## 2016-03-27 ENCOUNTER — Ambulatory Visit (INDEPENDENT_AMBULATORY_CARE_PROVIDER_SITE_OTHER): Payer: Commercial Managed Care - HMO | Admitting: Family Medicine

## 2016-03-27 ENCOUNTER — Encounter: Payer: Self-pay | Admitting: Family Medicine

## 2016-03-27 VITALS — BP 140/62 | HR 90 | Ht 62.0 in | Wt 151.0 lb

## 2016-03-27 DIAGNOSIS — Z1159 Encounter for screening for other viral diseases: Secondary | ICD-10-CM

## 2016-03-27 DIAGNOSIS — Z23 Encounter for immunization: Secondary | ICD-10-CM

## 2016-03-27 DIAGNOSIS — N183 Chronic kidney disease, stage 3 (moderate): Secondary | ICD-10-CM

## 2016-03-27 DIAGNOSIS — E119 Type 2 diabetes mellitus without complications: Secondary | ICD-10-CM

## 2016-03-27 DIAGNOSIS — Z780 Asymptomatic menopausal state: Secondary | ICD-10-CM

## 2016-03-27 DIAGNOSIS — I77819 Aortic ectasia, unspecified site: Secondary | ICD-10-CM

## 2016-03-27 DIAGNOSIS — N1832 Chronic kidney disease, stage 3b: Secondary | ICD-10-CM

## 2016-03-27 DIAGNOSIS — R748 Abnormal levels of other serum enzymes: Secondary | ICD-10-CM | POA: Diagnosis not present

## 2016-03-27 DIAGNOSIS — I10 Essential (primary) hypertension: Secondary | ICD-10-CM

## 2016-03-27 LAB — BASIC METABOLIC PANEL WITH GFR
BUN: 25 mg/dL (ref 7–25)
CO2: 23 mmol/L (ref 20–31)
Calcium: 9.8 mg/dL (ref 8.6–10.4)
Chloride: 102 mmol/L (ref 98–110)
Creat: 1.09 mg/dL — ABNORMAL HIGH (ref 0.60–0.93)
GFR, Est African American: 59 mL/min — ABNORMAL LOW (ref >=60)
GFR, Est Non African American: 51 mL/min — ABNORMAL LOW (ref >=60)
Glucose, Bld: 159 mg/dL — ABNORMAL HIGH (ref 65–99)
Potassium: 4.9 mmol/L (ref 3.5–5.3)
Sodium: 138 mmol/L (ref 135–146)

## 2016-03-27 LAB — POCT GLYCOSYLATED HEMOGLOBIN (HGB A1C): Hemoglobin A1C: 8.9

## 2016-03-27 NOTE — Progress Notes (Signed)
Subjective:    CC: HTN  HPI: Hypertension- Pt denies chest pain, SOB, dizziness, or heart palpitations.  Taking meds as directed w/o problems.  Denies medication side effects.    Diabetes - no hypoglycemic events. No wounds or sores that are not healing well. No increased thirst or urination. Checking glucose at home.She reports sugars are running between 120 160. Taking medications as prescribed without any side effects. She did stop her Jardiance for a little while because she was getting her infection on her finger and was worried it was caused by the medication. She did restarted about a week ago  CKD 3 - He says she got a letter from [facility] saying that she had a lot of acid in her urine. She said it was some type of study and wanted to know what she should do about it. I do not have a copy of the lab work so it's difficult for me to assess.  She would like to go ahead and get her bone density test updated. Last test was in October 2013. Unclear about location.  Patient wanted to go over her aortic ultrasound results from last year. She had some questions about it.  Past medical history, Surgical history, Family history not pertinant except as noted below, Social history, Allergies, and medications have been entered into the medical record, reviewed, and corrections made.   Review of Systems: No fevers, chills, night sweats, weight loss, chest pain, or shortness of breath.   Objective:    General: Well Developed, well nourished, and in no acute distress.  Neuro: Alert and oriented x3, extra-ocular muscles intact, sensation grossly intact.  HEENT: Normocephalic, atraumatic  Skin: Warm and dry, no rashes. Cardiac: Regular rate and rhythm, no murmurs rubs or gallops, no lower extremity edema.  Respiratory: Clear to auscultation bilaterally. Not using accessory muscles, speaking in full sentences.   Impression and Recommendations:    HTN -  Well controlled. Continue current  regimen. Follow up in  6 mo.    DM- Uncontrolled but significantly improved. Hemoglobin A1c down to 8.9. She is just restarted her guardians about a week ago so we'll see if her A1c continues to improve. Okay to increase the Levemir from 11 units to 14 units.  F/U in 3 mo.   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 Recommend repeat ultrasound in 5 years which would be October 2021.  CKD 3 - Due to recheck function. Given H.O on ways to decrease acid in the urine.

## 2016-03-27 NOTE — Addendum Note (Signed)
Addended by: [name] D on: 03/27/2016 11:11 AM   Modules accepted: Orders

## 2016-03-27 NOTE — Patient Instructions (Signed)
Increase Levemir to 14 units daily in the evening.

## 2016-03-28 LAB — HEPATITIS C ANTIBODY: HCV Ab: NEGATIVE

## 2016-05-21 ENCOUNTER — Other Ambulatory Visit: Payer: Self-pay | Admitting: Family Medicine

## 2016-06-26 ENCOUNTER — Encounter: Payer: Self-pay | Admitting: Family Medicine

## 2016-06-26 ENCOUNTER — Ambulatory Visit (INDEPENDENT_AMBULATORY_CARE_PROVIDER_SITE_OTHER): Payer: Medicare Other | Admitting: Family Medicine

## 2016-06-26 VITALS — BP 129/68 | HR 96 | Wt 152.0 lb

## 2016-06-26 DIAGNOSIS — I10 Essential (primary) hypertension: Secondary | ICD-10-CM | POA: Diagnosis not present

## 2016-06-26 DIAGNOSIS — E119 Type 2 diabetes mellitus without complications: Secondary | ICD-10-CM

## 2016-06-26 LAB — POCT GLYCOSYLATED HEMOGLOBIN (HGB A1C): Hemoglobin A1C: 7.5

## 2016-06-26 NOTE — Progress Notes (Signed)
Subjective:    CC: HTN, DM  HPI: Hypertension- Pt denies chest pain, SOB, dizziness, or heart palpitations.  Taking meds as directed w/o problems.  Denies medication side effects.  Reports that her home blood pressures have been well controlled with systolics running in the [id number] and 120s.  Diabetes - no hypoglycemic events. No wounds or sores that are not healing well. No increased thirst or urination. Checking glucose at home. Taking medications as prescribed without any side effects.We increased her Levemir to 14 units.He reports that her home blood sugars have been running in the 120s to the 150 range   Past medical history, Surgical history, Family history not pertinant except as noted below, Social history, Allergies, and medications have been entered into the medical record, reviewed, and corrections made.   Review of Systems: No fevers, chills, night sweats, weight loss, chest pain, or shortness of breath.   Objective:    General: Well Developed, well nourished, and in no acute distress.  Neuro: Alert and oriented x3, extra-ocular muscles intact, sensation grossly intact.  HEENT: Normocephalic, atraumatic  Skin: Warm and dry, no rashes. Cardiac: Regular rate and rhythm, no murmurs rubs or gallops, no lower extremity edema.  Respiratory: Clear to auscultation bilaterally. Not using accessory muscles, speaking in full sentences.   Impression and Recommendations:   HTN - Well controlled. Continue current regimen. Follow up in  3-4 months.    DM- 11 A1c is coming down nicely. Previous was 8.9 and she is now down to 7.5. Today will increase her Levemir to 18 units which is up 4 total. Follow-up in 3 months. On ARB and statin. 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 Recommend repeat ultrasound in 5 years which would be October 2021

## 2016-06-29 DIAGNOSIS — Z01411 Encounter for gynecological examination (general) (routine) with abnormal findings: Secondary | ICD-10-CM | POA: Diagnosis not present

## 2016-06-29 DIAGNOSIS — Z01419 Encounter for gynecological examination (general) (routine) without abnormal findings: Secondary | ICD-10-CM | POA: Diagnosis not present

## 2016-07-15 ENCOUNTER — Other Ambulatory Visit: Payer: Self-pay | Admitting: Family Medicine

## 2016-07-17 ENCOUNTER — Encounter: Payer: Self-pay | Admitting: Family Medicine

## 2016-07-24 DIAGNOSIS — Z20828 Contact with and (suspected) exposure to other viral communicable diseases: Secondary | ICD-10-CM | POA: Diagnosis not present

## 2016-07-24 DIAGNOSIS — J3489 Other specified disorders of nose and nasal sinuses: Secondary | ICD-10-CM | POA: Diagnosis not present

## 2016-07-24 DIAGNOSIS — R05 Cough: Secondary | ICD-10-CM | POA: Diagnosis not present

## 2016-08-19 ENCOUNTER — Other Ambulatory Visit: Payer: Self-pay | Admitting: Family Medicine

## 2016-08-20 DIAGNOSIS — Z1211 Encounter for screening for malignant neoplasm of colon: Secondary | ICD-10-CM | POA: Diagnosis not present

## 2016-09-24 ENCOUNTER — Ambulatory Visit: Payer: Medicare Other | Admitting: *Deleted

## 2016-09-24 ENCOUNTER — Encounter: Payer: Self-pay | Admitting: *Deleted

## 2016-09-24 ENCOUNTER — Encounter: Payer: Self-pay | Admitting: Family Medicine

## 2016-09-24 ENCOUNTER — Ambulatory Visit (INDEPENDENT_AMBULATORY_CARE_PROVIDER_SITE_OTHER): Payer: Medicare Other | Admitting: Family Medicine

## 2016-09-24 VITALS — BP 138/70 | HR 96 | Temp 97.7°F | Ht 62.0 in | Wt 156.8 lb

## 2016-09-24 VITALS — BP 138/70 | HR 96 | Ht 62.0 in | Wt 156.0 lb

## 2016-09-24 DIAGNOSIS — Z6828 Body mass index (BMI) 28.0-28.9, adult: Secondary | ICD-10-CM

## 2016-09-24 DIAGNOSIS — E119 Type 2 diabetes mellitus without complications: Secondary | ICD-10-CM | POA: Diagnosis not present

## 2016-09-24 DIAGNOSIS — E1165 Type 2 diabetes mellitus with hyperglycemia: Secondary | ICD-10-CM | POA: Diagnosis not present

## 2016-09-24 DIAGNOSIS — Z780 Asymptomatic menopausal state: Secondary | ICD-10-CM

## 2016-09-24 DIAGNOSIS — IMO0001 Reserved for inherently not codable concepts without codable children: Secondary | ICD-10-CM

## 2016-09-24 DIAGNOSIS — Z794 Long term (current) use of insulin: Secondary | ICD-10-CM

## 2016-09-24 DIAGNOSIS — I10 Essential (primary) hypertension: Secondary | ICD-10-CM | POA: Diagnosis not present

## 2016-09-24 DIAGNOSIS — I77819 Aortic ectasia, unspecified site: Secondary | ICD-10-CM | POA: Diagnosis not present

## 2016-09-24 DIAGNOSIS — Z0000 Encounter for general adult medical examination without abnormal findings: Secondary | ICD-10-CM

## 2016-09-24 LAB — POCT GLYCOSYLATED HEMOGLOBIN (HGB A1C): Hemoglobin A1C: 8.9

## 2016-09-24 NOTE — Patient Instructions (Signed)
Increase Levemir to 24 units a day.   Call if sugars in the morning are running greater than 130 after 2 weeks on the higher Levemir dose.

## 2016-09-24 NOTE — Progress Notes (Signed)
Subjective:   Yolanda Fields is a 71 y.o. female who presents for Medicare Annual (Subsequent) preventive examination. Review of Systems:  Cardiac Risk Factors include: advanced age (>71men, >44 women);diabetes mellitus;dyslipidemia;family history of premature cardiovascular disease;hypertension;sedentary lifestyle    Objective:    Vitals: BP 138/70   Pulse 96   Temp 97.7 F (36.5 C) (Oral)   Ht 5\' 2"  (1.575 m)   Wt 156 lb 12.8 oz (71.1 kg)   SpO2 98%   BMI 28.68 kg/m   Body mass index is 28.68 kg/m.  Tobacco History  Smoking Status  . Never Smoker  Smokeless Tobacco  . Never Used     Counseling given: No   Past Medical History:  Diagnosis Date  . Diabetes mellitus without complication ([place])   . Hyperlipidemia   . Hypertension    Past Surgical History:  Procedure Laterality Date  . CESAREAN SECTION     Family History  Problem Relation Age of Onset  . Bladder Cancer Brother   . Cancer Sister   . Heart failure Mother 38  . Diabetes Mother   . Heart failure Brother   . Diabetes Brother   . Diabetes Sister   . Coronary artery disease Father 73  . Heart attack Father    History  Sexual Activity  . Sexual activity: No   Outpatient Encounter Prescriptions as of 09/24/2016  Medication Sig  . amLODipine (NORVASC) 10 MG tablet TAKE 1 TABLET BY MOUTH ONCE DAILY  . atorvastatin (LIPITOR) 10 MG tablet Take 1 tablet (10 mg total) by mouth at bedtime.  . Cholecalciferol 2000 UNITS CAPS Take by mouth.  Wallace Cullens POWD by Does not apply route.  . cyanocobalamin 1000 MCG tablet Take 1,000 mcg by mouth daily.  . empagliflozin (JARDIANCE) 10 MG TABS tablet Take 10 mg by mouth daily.  Marland Kitchen esomeprazole (NEXIUM) 40 MG capsule TAKE ONE CAPSULE BY MOUTH EVERY MORNING BEFORE BREAKFAST AS DIRECTED  . furosemide (LASIX) 40 MG tablet Take 1 tablet (40 mg total) by mouth daily as needed.  . Insulin Detemir (LEVEMIR) 100 UNIT/ML Pen Inject 10-20 Units into the skin daily at  10 pm.  . JANUMET 50-1000 MG tablet TAKE ONE TABLET BY MOUTH 2 TIMES A DAY WITH A MEAL  . levothyroxine (SYNTHROID, LEVOTHROID) 100 MCG tablet TAKE ONE TABLET BY MOUTH EVERY DAY  . losartan (COZAAR) 100 MG tablet TAKE 1 TABLET BY MOUTH ONCE DAILY  . metoprolol succinate (TOPROL-XL) 25 MG 24 hr tablet Take 25 mg by mouth daily.  . travoprost, benzalkonium, (TRAVATAN) 0.004 % ophthalmic solution 1 drop at bedtime.  . TRULICITY 1.5 XB/2.8UX SOPN INJECT 1.5MG  INTO THE SKIN ONCE A WEEK  . Multiple Vitamin (MULTIVITAMIN) tablet Take 1 tablet by mouth daily.   No facility-administered encounter medications on file as of 09/24/2016.    Activities of Daily Living In your present state of health, do you have any difficulty performing the following activities: 09/24/2016  Hearing? Y  Vision? N  Difficulty concentrating or making decisions? N  Walking or climbing stairs? N  Dressing or bathing? N  Doing errands, shopping? N  Preparing Food and eating ? N  Using the Toilet? N  In the past six months, have you accidently leaked urine? N  Do you have problems with loss of bowel control? N  Managing your Medications? N  Managing your Finances? N  Housekeeping or managing your Housekeeping? N  Some recent data might be hidden   Patient Care  Team: [name], MD as PCP - General (Family Medicine)    Assessment:    Exercise Activities and Dietary recommendations Current Exercise Habits: Home exercise routine, Type of exercise: walking (She usually only goes walking if the weather is good.  Recommended joining [employer].), Time (Minutes): 30, Frequency (Times/Week): 3, Weekly Exercise (Minutes/Week): 90, Intensity: Mild, Exercise limited by: cardiac condition(s)  Goals      Patient Stated   . Weight (lb) < 200 lb (90.7 kg) (pt-stated)          She would like to not GAIN any more weight.      Fall Risk Fall Risk  09/24/2016 08/26/2015  Falls in the past year? No No   Depression  Screen PHQ 2/9 Scores 09/24/2016 08/26/2015  PHQ - 2 Score 0 0   Cognitive Function-Normal cognitive function per 6CIT score today of 0 and per conversation.     6CIT Screen 09/24/2016  What Year? 0 points  What month? 0 points  What time? 0 points  Count back from 20 0 points  Months in reverse 0 points  Repeat phrase 0 points  Total Score 0    Immunization History  Administered Date(s) Administered  . Influenza, Seasonal, Injecte, Preservative Fre 06/10/2011, 04/21/2012  . Influenza,inj,Quad PF,36+ Mos 04/20/2013, 03/27/2016  . Influenza-Unspecified 04/29/2015  . Pneumococcal Conjugate-13 07/23/2014, 12/26/2015  . Pneumococcal Polysaccharide-23 11/28/2009  . Zoster 04/25/2012   Screening Tests Health Maintenance  Topic Date Due  . OPHTHALMOLOGY EXAM  09/01/2016  . TETANUS/TDAP  09/24/2017 (Originally 11/21/1964)  . FOOT EXAM  12/25/2016  . HEMOGLOBIN A1C  12/25/2016  . MAMMOGRAM  01/15/2018  . COLONOSCOPY  05/08/2022  . INFLUENZA VACCINE  Completed  . DEXA SCAN  Completed  . Hepatitis C Screening  Completed  . PNA vac Low Risk Adult  Completed     Plan:   I have personally reviewed and addressed the Medicare Annual Wellness questionnaire and have noted the following in the patient's chart:  A. Medical and social history-Discussed. B. Use of alcohol, tobacco or illicit drugs-Discussed. C. Current medications and supplements-Discussed, updated. D. Functional ability and status-She is very independent but is [facility] and wears bilateral hearing aids. E.  Nutritional status-BMI in overweight category. Disiscussed this & she would like to not gain any more weight and may shed some weight once she is able to get out and walk on nice days and/or join the [employer], which was suggested. F.  Physical activity-Discussed. G.        Advance directives-Discussed. H. List of other physicians I.  Hospitalizations, surgeries, and ER visits in previous 12 months [place] to include cognitive, depression-Normal cognitive function per 6CIT score of 0 today in office and conversation today.  There was no evidence of depression and her PHQ-2 was scored at 0 today. L. Referrals and appointments - I will refer her for a bone density test, per her request and due to her being post-menopausal.  In addition, I have reviewed and discussed with patient certain preventive protocols, quality metrics, and best practice recommendations. A written personalized care plan for preventive services as well as general preventive health recommendations were provided to patient.  Signed,   Nestor Lewandowsky, RN

## 2016-09-24 NOTE — Progress Notes (Signed)
Subjective:    CC: DM  HPI: Diabetes - no hypoglycemic events. No wounds or sores that are not healing well. No increased thirst or urination. Checking glucose at home. Taking medications as prescribed without any side effects.She does not take her Jardiance daily because she feels like it sometimes causes her finger nails to break out and causes. Nikki a.  Hypertension- Pt denies chest pain, SOB, dizziness, or heart palpitations.  Taking meds as directed w/o problems.  Denies medication side effects.    Aortic ectasia-Iast scan October 2016. Recommendation based on size of 2.5 cm was to repeat ultrasound in 5 years which will be 2021.  He also complains of some left ankle pain. It happens mostly at night and she can usually get up and walk around it actually feels better. It doesn't happen every night but just occasionally. She denies any swelling or redness or rash.  Past medical history, Surgical history, Fa mily history not pertinant except as noted below, Social history, Allergies, and medications have been entered into the medical record, reviewed, and corrections made.   Review of Systems: No fevers, chills, night sweats, weight loss, chest pain, or shortness of breath.   Objective:    General: Well Developed, well nourished, and in no acute distress.  Neuro: Alert and oriented x3, extra-ocular muscles intact, sensation grossly intact.  HEENT: Normocephalic, atraumatic  Skin: Warm and dry, no rashes. Cardiac: Regular rate and rhythm, no murmurs rubs or gallops, no lower extremity edema.  Respiratory: Clear to auscultation bilaterally. Not using accessory muscles, speaking in full sentences.   Impression and Recommendations:   DM- Uncontrolled. Hemoglobin A1c elevated at 8.9 today. This is up from previous of 7.5 back in December. Kircher to get more active. She feels like she's already active but admits she may not be doing as much as she normally would in the summer time. Will  actually have her increase her insulin to 24 units.  HTN - Well controlled. Continue current regimen. Follow up in  3-4 months.   Aortic ectasia-Iast scan October 2016. Recommendation based on size of 2.5 cm was to repeat ultrasound in 5 years which will be 2021  DEXA - will schedule. Last one 5 yr ago.

## 2016-09-24 NOTE — Patient Instructions (Addendum)
Health maintenance:You currently do not have any gaps in your care.  Abnormal screenings: None.  Patient concerns: Diabetes.  I will give you some guidelines on diet.  Nurse concerns:Diabetes.  See above.  Next PCP appt: Your next AWV-S will be in one year with the nurse health advisor.    Carbohydrate Counting for Diabetes Mellitus, Adult Carbohydrate counting is a method for keeping track of how many carbohydrates you eat. Eating carbohydrates naturally increases the amount of sugar (glucose) in the blood. Counting how many carbohydrates you eat helps keep your blood glucose within normal limits, which helps you manage your diabetes (diabetes mellitus). It is important to know how many carbohydrates you can safely have in each meal. This is different for every person. A diet and nutrition specialist (registered dietitian) can help you make a meal plan and calculate how many carbohydrates you should have at each meal and snack. Carbohydrates are found in the following foods:  Grains, such as breads and cereals.  Dried beans and soy products.  Starchy vegetables, such as potatoes, peas, and corn.  Fruit and fruit juices.  Milk and yogurt.  Sweets and snack foods, such as cake, cookies, candy, chips, and soft drinks. How do I count carbohydrates? There are two ways to count carbohydrates in food. You can use either of the methods or a combination of both. Reading "Nutrition Facts" on packaged food  The "Nutrition Facts" list is included on the labels of almost all packaged foods and beverages in the [place] It includes:  The serving size.  Information about nutrients in each serving, including the grams (g) of carbohydrate per serving. To use the "Nutrition Facts":  Decide how many servings you will have.  Multiply the number of servings by the number of carbohydrates per serving.  The resulting number is the total amount of carbohydrates that you will be having. Learning  standard serving sizes of other foods  When you eat foods containing carbohydrates that are not packaged or do not include "Nutrition Facts" on the label, you need to measure the servings in order to count the amount of carbohydrates:  Measure the foods that you will eat with a food scale or measuring cup, if needed.  Decide how many standard-size servings you will eat.  Multiply the number of servings by 15. Most carbohydrate-rich foods have about 15 g of carbohydrates per serving.  For example, if you eat 8 oz (170 g) of strawberries, you will have eaten 2 servings and 30 g of carbohydrates (2 servings x 15 g = 30 g).  For foods that have more than one food mixed, such as soups and casseroles, you must count the carbohydrates in each food that is included. The following list contains standard serving sizes of common carbohydrate-rich foods. Each of these servings has about 15 g of carbohydrates:   hamburger bun or  English muffin.   oz (15 mL) syrup.   oz (14 g) jelly.  1 slice of bread.  1 six-inch tortilla.  3 oz (85 g) cooked rice or pasta.  4 oz (113 g) cooked dried beans.  4 oz (113 g) starchy vegetable, such as peas, corn, or potatoes.  4 oz (113 g) hot cereal.  4 oz (113 g) mashed potatoes or  of a large baked potato.  4 oz (113 g) canned or frozen fruit.  4 oz (120 mL) fruit juice.  4-6 crackers.  6 chicken nuggets.  6 oz (170 g) unsweetened dry cereal.  6  oz (170 g) plain fat-free yogurt or yogurt sweetened with artificial sweeteners.  8 oz (240 mL) milk.  8 oz (170 g) fresh fruit or one small piece of fruit.  24 oz (680 g) popped popcorn. Example of carbohydrate counting Sample meal   3 oz (85 g) chicken breast.  6 oz (170 g) brown rice.  4 oz (113 g) corn.  8 oz (240 mL) milk.  8 oz (170 g) strawberries with sugar-free whipped topping. Carbohydrate calculation  1. Identify the foods that contain  carbohydrates:  Rice.  Corn.  Milk.  Strawberries. 2. Calculate how many servings you have of each food:  2 servings rice.  1 serving corn.  1 serving milk.  1 serving strawberries. 3. Multiply each number of servings by 15 g:  2 servings rice x 15 g = 30 g.  1 serving corn x 15 g = 15 g.  1 serving milk x 15 g = 15 g.  1 serving strawberries x 15 g = 15 g. 4. Add together all of the amounts to find the total grams of carbohydrates eaten:  30 g + 15 g + 15 g + 15 g = 75 g of carbohydrates total. This information is not intended to replace advice given to you by your health care provider. Make sure you discuss any questions you have with your health care provider. Document Released: 07/06/2005 Document Revised: 01/24/2016 Document Reviewed: 12/18/2015 Elsevier Interactive Patient Education  2017 [place].   Bone Densitometry Bone densitometry is an imaging test that uses a special X-ray to measure the amount of calcium and other minerals in your bones (bone density). This test is also known as a bone mineral density test or dual-energy X-ray absorptiometry (DXA). The test can measure bone density at your hip and your spine. It is similar to having a regular X-ray. You may have this test to:  Diagnose a condition that causes weak or thin bones (osteoporosis).  Predict your risk of a broken bone (fracture).  Determine how well osteoporosis treatment is working. Tell a health care provider about:  Any allergies you have.  All medicines you are taking, including vitamins, herbs, eye drops, creams, and over-the-counter medicines.  Any problems you or family members have had with anesthetic medicines.  Any blood disorders you have.  Any surgeries you have had.  Any medical conditions you have.  Possibility of pregnancy.  Any other medical test you had within the previous 14 days that used contrast material. What are the risks? Generally, this is a safe  procedure. However, problems can occur and may include the following:  This test exposes you to a very small amount of radiation.  The risks of radiation exposure may be greater to unborn children. What happens before the procedure?  Do not take any calcium supplements for 24 hours before having the test. You can otherwise eat and drink what you usually do.  Take off all metal jewelry, eyeglasses, dental appliances, and any other metal objects. What happens during the procedure?  You may lie on an exam table. There will be an X-ray generator below you and an imaging device above you.  Other devices, such as boxes or braces, may be used to position your body properly for the scan.  You will need to lie still while the machine slowly scans your body.  The images will show up on a computer monitor. What happens after the procedure? You may need more testing at a later time.  This information is not intended to replace advice given to you by your health care provider. Make sure you discuss any questions you have with your health care provider. Document Released: 07/28/2004 Document Revised: 12/12/2015 Document Reviewed: 12/14/2013 Elsevier Interactive Patient Education  2017 [place] 65 Years and Older, Female Preventive care refers to lifestyle choices and visits with your health care provider that can promote health and wellness. What does preventive care include?  A yearly physical exam. This is also called an annual well check.  Dental exams once or twice a year.  Routine eye exams. Ask your health care provider how often you should have your eyes checked.  Personal lifestyle choices, including:  Daily care of your teeth and gums.  Regular physical activity.  Eating a healthy diet.  Avoiding tobacco and drug use.  Limiting alcohol use.  Practicing safe sex.  Taking low-dose aspirin every day.  Taking vitamin and mineral supplements as  recommended by your health care provider. What happens during an annual well check? The services and screenings done by your health care provider during your annual well check will depend on your age, overall health, lifestyle risk factors, and family history of disease. Counseling  Your health care provider may ask you questions about your:  Alcohol use.  Tobacco use.  Drug use.  Emotional well-being.  Home and relationship well-being.  Sexual activity.  Eating habits.  History of falls.  Memory and ability to understand (cognition).  Work and work Statistician.  Reproductive health. Screening  You may have the following tests or measurements:  Height, weight, and BMI.  Blood pressure.  Lipid and cholesterol levels. These may be checked every 5 years, or more frequently if you are over 8 years old.  Skin check.  Lung cancer screening. You may have this screening every year starting at age 7 if you have a 30-pack-year history of smoking and currently smoke or have quit within the past 15 years.  Fecal occult blood test (FOBT) of the stool. You may have this test every year starting at age 10.  Flexible sigmoidoscopy or colonoscopy. You may have a sigmoidoscopy every 5 years or a colonoscopy every 10 years starting at age 56.  Hepatitis C blood test.  Hepatitis B blood test.  Sexually transmitted disease (STD) testing.  Diabetes screening. This is done by checking your blood sugar (glucose) after you have not eaten for a while (fasting). You may have this done every 1-3 years.  Bone density scan. This is done to screen for osteoporosis. You may have this done starting at age 66.  Mammogram. This may be done every 1-2 years. Talk to your health care provider about how often you should have regular mammograms. Talk with your health care provider about your test results, treatment options, and if necessary, the need for more tests. Vaccines  Your health care  provider may recommend certain vaccines, such as:  Influenza vaccine. This is recommended every year.  Tetanus, diphtheria, and acellular pertussis (Tdap, Td) vaccine. You may need a Td booster every 10 years.  Varicella vaccine. You may need this if you have not been vaccinated.  Zoster vaccine. You may need this after age 12.  Measles, mumps, and rubella (MMR) vaccine. You may need at least one dose of MMR if you were born in 1957 or later. You may also need a second dose.  Pneumococcal 13-valent conjugate (PCV13) vaccine. One dose is recommended after age 50.  Pneumococcal  polysaccharide (PPSV23) vaccine. One dose is recommended after age 13.  Meningococcal vaccine. You may need this if you have certain conditions.  Hepatitis A vaccine. You may need this if you have certain conditions or if you travel or work in places where you may be exposed to hepatitis A.  Hepatitis B vaccine. You may need this if you have certain conditions or if you travel or work in places where you may be exposed to hepatitis B.  Haemophilus influenzae type b (Hib) vaccine. You may need this if you have certain conditions. Talk to your health care provider about which screenings and vaccines you need and how often you need them. This information is not intended to replace advice given to you by your health care provider. Make sure you discuss any questions you have with your health care provider. Document Released: 08/02/2015 Document Revised: 03/25/2016 Document Reviewed: 05/07/2015 Elsevier Interactive Patient Education  2017 [employer].

## 2016-09-25 LAB — COMPLETE METABOLIC PANEL WITH GFR
ALT: 14 U/L (ref 6–29)
AST: 13 U/L (ref 10–35)
Albumin: 4.3 g/dL (ref 3.6–5.1)
Alkaline Phosphatase: 69 U/L (ref 33–130)
BUN: 24 mg/dL (ref 7–25)
CO2: 25 mmol/L (ref 20–31)
Calcium: 9.4 mg/dL (ref 8.6–10.4)
Chloride: 103 mmol/L (ref 98–110)
Creat: 1.03 mg/dL — ABNORMAL HIGH (ref 0.60–0.93)
GFR, Est African American: 63 mL/min (ref >=60)
GFR, Est Non African American: 54 mL/min — ABNORMAL LOW (ref >=60)
Glucose, Bld: 179 mg/dL — ABNORMAL HIGH (ref 65–99)
Potassium: 4.6 mmol/L (ref 3.5–5.3)
Sodium: 137 mmol/L (ref 135–146)
Total Bilirubin: 1.1 mg/dL (ref 0.2–1.2)
Total Protein: 6.7 g/dL (ref 6.1–8.1)

## 2016-09-25 LAB — LIPID PANEL W/REFLEX DIRECT LDL
Cholesterol: 114 mg/dL (ref <200)
HDL: 48 mg/dL — ABNORMAL LOW (ref >50)
LDL-Cholesterol: 48 mg/dL
Non-HDL Cholesterol (Calc): 66 mg/dL (ref <130)
Total CHOL/HDL Ratio: 2.4 Ratio (ref <5.0)
Triglycerides: 97 mg/dL (ref <150)

## 2016-09-26 NOTE — Progress Notes (Signed)
REviewed and agree with above.  [name], MD

## 2016-10-01 DIAGNOSIS — H5203 Hypermetropia, bilateral: Secondary | ICD-10-CM | POA: Diagnosis not present

## 2016-10-01 DIAGNOSIS — H2513 Age-related nuclear cataract, bilateral: Secondary | ICD-10-CM | POA: Diagnosis not present

## 2016-10-01 DIAGNOSIS — E119 Type 2 diabetes mellitus without complications: Secondary | ICD-10-CM | POA: Diagnosis not present

## 2016-10-01 DIAGNOSIS — H401132 Primary open-angle glaucoma, bilateral, moderate stage: Secondary | ICD-10-CM | POA: Diagnosis not present

## 2016-10-01 DIAGNOSIS — Z135 Encounter for screening for eye and ear disorders: Secondary | ICD-10-CM | POA: Diagnosis not present

## 2016-10-06 ENCOUNTER — Ambulatory Visit (INDEPENDENT_AMBULATORY_CARE_PROVIDER_SITE_OTHER): Payer: Medicare Other

## 2016-10-06 DIAGNOSIS — Z780 Asymptomatic menopausal state: Secondary | ICD-10-CM | POA: Diagnosis not present

## 2016-11-18 ENCOUNTER — Other Ambulatory Visit: Payer: Self-pay | Admitting: Family Medicine

## 2016-11-25 ENCOUNTER — Other Ambulatory Visit: Payer: Self-pay | Admitting: Family Medicine

## 2016-12-11 ENCOUNTER — Other Ambulatory Visit: Payer: Self-pay | Admitting: Family Medicine

## 2016-12-16 DIAGNOSIS — L570 Actinic keratosis: Secondary | ICD-10-CM | POA: Diagnosis not present

## 2016-12-16 DIAGNOSIS — L821 Other seborrheic keratosis: Secondary | ICD-10-CM | POA: Diagnosis not present

## 2016-12-25 ENCOUNTER — Ambulatory Visit (INDEPENDENT_AMBULATORY_CARE_PROVIDER_SITE_OTHER): Payer: Medicare Other | Admitting: Family Medicine

## 2016-12-25 ENCOUNTER — Encounter: Payer: Self-pay | Admitting: Family Medicine

## 2016-12-25 VITALS — BP 135/53 | HR 113 | Ht 60.24 in | Wt 151.0 lb

## 2016-12-25 DIAGNOSIS — I10 Essential (primary) hypertension: Secondary | ICD-10-CM | POA: Diagnosis not present

## 2016-12-25 DIAGNOSIS — Z794 Long term (current) use of insulin: Secondary | ICD-10-CM | POA: Diagnosis not present

## 2016-12-25 DIAGNOSIS — N183 Chronic kidney disease, stage 3 (moderate): Secondary | ICD-10-CM | POA: Diagnosis not present

## 2016-12-25 DIAGNOSIS — IMO0001 Reserved for inherently not codable concepts without codable children: Secondary | ICD-10-CM

## 2016-12-25 DIAGNOSIS — N1832 Chronic kidney disease, stage 3b: Secondary | ICD-10-CM

## 2016-12-25 DIAGNOSIS — E1165 Type 2 diabetes mellitus with hyperglycemia: Secondary | ICD-10-CM

## 2016-12-25 LAB — POCT UA - MICROALBUMIN
Creatinine, POC: 300 mg/dL
Microalbumin Ur, POC: 80 mg/L

## 2016-12-25 LAB — POCT GLYCOSYLATED HEMOGLOBIN (HGB A1C): Hemoglobin A1C: 7.7

## 2016-12-25 NOTE — Patient Instructions (Signed)
Increase Levemir to 30 units.

## 2016-12-25 NOTE — Progress Notes (Signed)
Subjective:    CC: DM, HTN  HPI: Diabetes - no hypoglycemic events. No wounds or sores that are not healing well. No increased thirst or urination. Checking glucose at home. Taking medications as prescribed without any side effects. She is up to 28 units on her Levemir daily.  Has only been on Jardiance for about 6 weeks but tolerating well so far.    Hypertension- Pt denies chest pain, SOB, dizziness, or heart palpitations.  Taking meds as directed w/o problems.  Denies medication side effects.    Aortic ectasia-Iast scan October 2016. Recommendation based on size of 2.5 cm was to repeat ultrasound in 5 years which will be 2021  CKD 3 - STable.  She wanted to review her labs from March. Her brother just passed away from pancreatic cancer and renal problems about 2 and half months ago so she just wants to keep an eye on things.  Past medical history, Surgical history, Family history not pertinant except as noted below, Social history, Allergies, and medications have been entered into the medical record, reviewed, and corrections made.   Review of Systems: No fevers, chills, night sweats, weight loss, chest pain, or shortness of breath.   Objective:    General: Well Developed, well nourished, and in no acute distress.  Neuro: Alert and oriented x3, extra-ocular muscles intact, sensation grossly intact.  HEENT: Normocephalic, atraumatic  Skin: Warm and dry, no rashes. Cardiac: Regular rate and rhythm, no murmurs rubs or gallops, no lower extremity edema.  Respiratory: Clear to auscultation bilaterally. Not using accessory muscles, speaking in full sentences.   Impression and Recommendations:    DM-  Improved.  A1C is down to 7.7 which is great progress from 8.9.  Increase Levemir to 30 units. Will call to get recent eye report.   HTN - Well controlled. Continue current regimen. Follow up in  3-4 months.   CKD 3-reassured her that her kidneys are stable. Given a handout to show her her  trend on her lab work.

## 2017-01-12 ENCOUNTER — Other Ambulatory Visit: Payer: Self-pay | Admitting: Family Medicine

## 2017-01-13 DIAGNOSIS — H903 Sensorineural hearing loss, bilateral: Secondary | ICD-10-CM | POA: Diagnosis not present

## 2017-01-29 DIAGNOSIS — H401132 Primary open-angle glaucoma, bilateral, moderate stage: Secondary | ICD-10-CM | POA: Diagnosis not present

## 2017-01-29 LAB — HM DIABETES EYE EXAM

## 2017-02-08 DIAGNOSIS — L821 Other seborrheic keratosis: Secondary | ICD-10-CM | POA: Diagnosis not present

## 2017-02-08 DIAGNOSIS — D0439 Carcinoma in situ of skin of other parts of face: Secondary | ICD-10-CM | POA: Diagnosis not present

## 2017-02-11 ENCOUNTER — Other Ambulatory Visit: Payer: Self-pay | Admitting: *Deleted

## 2017-02-11 MED ORDER — SITAGLIPTIN PHOS-METFORMIN HCL 50-1000 MG PO TABS: ORAL_TABLET | ORAL | 0 refills | Status: DC

## 2017-02-11 MED ORDER — ESOMEPRAZOLE MAGNESIUM 40 MG PO CPDR: 40.0000 mg | DELAYED_RELEASE_CAPSULE | Freq: Two times a day (BID) | ORAL | 0 refills | Status: DC

## 2017-02-11 MED ORDER — FUROSEMIDE 40 MG PO TABS: 40.0000 mg | ORAL_TABLET | Freq: Every day | ORAL | 0 refills | Status: DC | PRN

## 2017-02-11 MED ORDER — DULAGLUTIDE 1.5 MG/0.5ML ~~LOC~~ SOAJ: SUBCUTANEOUS | 3 refills | Status: DC

## 2017-02-15 ENCOUNTER — Other Ambulatory Visit: Payer: Self-pay | Admitting: *Deleted

## 2017-02-15 MED ORDER — DULAGLUTIDE 1.5 MG/0.5ML ~~LOC~~ SOAJ: SUBCUTANEOUS | 1 refills | Status: DC

## 2017-02-16 ENCOUNTER — Other Ambulatory Visit: Payer: Self-pay

## 2017-02-16 MED ORDER — DULAGLUTIDE 1.5 MG/0.5ML ~~LOC~~ SOAJ: SUBCUTANEOUS | 0 refills | Status: DC

## 2017-03-09 ENCOUNTER — Other Ambulatory Visit: Payer: Self-pay | Admitting: Family Medicine

## 2017-03-09 NOTE — Telephone Encounter (Signed)
Pt is requesting a refill on her Jardiance. She said that Dr. Madilyn Fireman stated she would send in a 3 month supply for pt next time she needed them. Pt stated that the pharmacy told her they had faxed over a request to [place] but had not heard back from [place]. Pt needs the prescription sent to [facility].

## 2017-03-10 DIAGNOSIS — D0430 Carcinoma in situ of skin of unspecified part of face: Secondary | ICD-10-CM | POA: Diagnosis not present

## 2017-04-02 ENCOUNTER — Ambulatory Visit: Payer: Medicare Other | Admitting: Family Medicine

## 2017-04-06 DIAGNOSIS — D0430 Carcinoma in situ of skin of unspecified part of face: Secondary | ICD-10-CM | POA: Diagnosis not present

## 2017-04-06 DIAGNOSIS — L570 Actinic keratosis: Secondary | ICD-10-CM | POA: Diagnosis not present

## 2017-04-13 ENCOUNTER — Telehealth: Payer: Self-pay | Admitting: Family Medicine

## 2017-04-13 ENCOUNTER — Encounter: Payer: Self-pay | Admitting: Family Medicine

## 2017-04-13 ENCOUNTER — Ambulatory Visit (INDEPENDENT_AMBULATORY_CARE_PROVIDER_SITE_OTHER): Payer: Medicare Other | Admitting: Family Medicine

## 2017-04-13 VITALS — BP 133/49 | HR 100 | Ht 60.0 in | Wt 151.0 lb

## 2017-04-13 DIAGNOSIS — I10 Essential (primary) hypertension: Secondary | ICD-10-CM

## 2017-04-13 DIAGNOSIS — Z23 Encounter for immunization: Secondary | ICD-10-CM

## 2017-04-13 DIAGNOSIS — C44300 Unspecified malignant neoplasm of skin of unspecified part of face: Secondary | ICD-10-CM

## 2017-04-13 DIAGNOSIS — R809 Proteinuria, unspecified: Secondary | ICD-10-CM

## 2017-04-13 DIAGNOSIS — E1129 Type 2 diabetes mellitus with other diabetic kidney complication: Secondary | ICD-10-CM | POA: Diagnosis not present

## 2017-04-13 DIAGNOSIS — E1165 Type 2 diabetes mellitus with hyperglycemia: Secondary | ICD-10-CM | POA: Diagnosis not present

## 2017-04-13 DIAGNOSIS — Z794 Long term (current) use of insulin: Secondary | ICD-10-CM | POA: Diagnosis not present

## 2017-04-13 DIAGNOSIS — IMO0001 Reserved for inherently not codable concepts without codable children: Secondary | ICD-10-CM

## 2017-04-13 LAB — POCT GLYCOSYLATED HEMOGLOBIN (HGB A1C): Hemoglobin A1C: 7

## 2017-04-13 LAB — POCT UA - MICROALBUMIN
Creatinine, POC: 50 mg/dL
Microalbumin Ur, POC: 10 mg/L

## 2017-04-13 MED ORDER — INSULIN DETEMIR 100 UNIT/ML FLEXPEN: 30.0000 [IU] | PEN_INJECTOR | Freq: Every day | SUBCUTANEOUS | 3 refills | Status: DC

## 2017-04-13 NOTE — Telephone Encounter (Signed)
Please call Dr. Baltazar Najjar for recent path report for skin biospy so we can put on file what type of skin scancer she had.

## 2017-04-13 NOTE — Progress Notes (Signed)
Subjective:    CC: DM  HPI:  Diabetes - no hypoglycemic events. No wounds or sores that are not healing well. No increased thirst or urination. Checking glucose at home. Taking medications as prescribed without any side effects. Had her eye exam done at [facility]. She is up to 30 units on her Levemir.    Hypertension- Pt denies chest pain, SOB, dizziness, or heart palpitations.  Taking meds as directed w/o problems.  Denies medication side effects.    She also had a lesion on her left facial cheek biopsied by Dr. Janifer Adie about a week ago aft the lesion didnt' resolve with cryotherapy.  She was told it was cancer. Not sure what kind.    Past medical history, Surgical history, Family history not pertinant except as noted below, Social history, Allergies, and medications have been entered into the medical record, reviewed, and corrections made.   Review of Systems: No fevers, chills, night sweats, weight loss, chest pain, or shortness of breath.   Objective:    General: Well Developed, well nourished, and in no acute distress.  Neuro: Alert and oriented x3, extra-ocular muscles intact, sensation grossly intact.  HEENT: Normocephalic, atraumatic  Skin: Warm and dry, no rashes. Cardiac: Regular rate and rhythm, no murmurs rubs or gallops, no lower extremity edema.  Respiratory: Clear to auscultation bilaterally. Not using accessory muscles, speaking in full sentences.   Impression and Recommendations:   DM- A1C is 7.0. Urine micro today.  Corrent and resend insuln Rx.  Much improved. F/U in 3 months.   HTN - Well controlled. Continue current regimen. Follow up in  3 months.   Microalbuminuria  -  on an ARB.   Skin cancer - will call for bx report to know what kind.

## 2017-04-14 ENCOUNTER — Telehealth: Payer: Self-pay | Admitting: Family Medicine

## 2017-04-14 DIAGNOSIS — Z86007 Personal history of in-situ neoplasm of skin: Secondary | ICD-10-CM

## 2017-04-14 NOTE — Telephone Encounter (Signed)
Please call patient on her know that we did get the pathology report back on the biopsy she had done on her left facial cheek. 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 Told her that I would call her and let her know once I need a formal diagnosis. I will add this to her problem is just so that I can record that she has had a history of this.

## 2017-04-14 NOTE — Telephone Encounter (Signed)
Called Dr. Anson Crofts office, requested record. They will fax over.

## 2017-04-15 NOTE — Telephone Encounter (Signed)
Left message for patient to call office for results. °

## 2017-04-19 ENCOUNTER — Telehealth: Payer: Self-pay | Admitting: *Deleted

## 2017-04-19 NOTE — Telephone Encounter (Signed)
Pre Authorization sent to cover my meds. [id number]

## 2017-04-19 NOTE — Telephone Encounter (Signed)
Pt advised.Yolanda Fields Yolanda Fields

## 2017-04-19 NOTE — Telephone Encounter (Signed)
Can we call her again.

## 2017-04-20 NOTE — Telephone Encounter (Signed)
Response from insurance is that a prior Josem Kaufmann is not needed. Left message on phafmacy vm

## 2017-04-30 DIAGNOSIS — Z1231 Encounter for screening mammogram for malignant neoplasm of breast: Secondary | ICD-10-CM | POA: Diagnosis not present

## 2017-04-30 LAB — HM MAMMOGRAPHY

## 2017-05-07 ENCOUNTER — Encounter: Payer: Self-pay | Admitting: Family Medicine

## 2017-05-07 ENCOUNTER — Other Ambulatory Visit: Payer: Self-pay | Admitting: Family Medicine

## 2017-06-04 DIAGNOSIS — H401132 Primary open-angle glaucoma, bilateral, moderate stage: Secondary | ICD-10-CM | POA: Diagnosis not present

## 2017-06-17 ENCOUNTER — Other Ambulatory Visit: Payer: Self-pay | Admitting: Family Medicine

## 2017-06-17 ENCOUNTER — Other Ambulatory Visit: Payer: Self-pay

## 2017-06-17 NOTE — Patient Outreach (Signed)
[facility]) Care Management  06/17/2017  Yolanda Fields 06-10-1946 [id number]    Medication Adherence call to Yolanda Fields patient is showing past due under [facility] Ins.on Atorvastatin 10 mg spoke with patient she does not want to received anymore call from [facility] patient was very upset she said she had already talk to someone else and hung up the phone.call [name] and spoke with Stand he said he is going to call patient and double check that she still takes this medication.   [facility] Direct Dial [phone]  Fax [phone] [email or web address]

## 2017-07-02 ENCOUNTER — Encounter: Payer: Self-pay | Admitting: Family Medicine

## 2017-07-15 ENCOUNTER — Ambulatory Visit: Payer: Medicare Other | Admitting: Family Medicine

## 2017-07-19 ENCOUNTER — Encounter: Payer: Self-pay | Admitting: Family Medicine

## 2017-07-19 ENCOUNTER — Ambulatory Visit (INDEPENDENT_AMBULATORY_CARE_PROVIDER_SITE_OTHER): Payer: Medicare Other | Admitting: Family Medicine

## 2017-07-19 VITALS — BP 140/51 | HR 85 | Ht 60.0 in | Wt 146.0 lb

## 2017-07-19 DIAGNOSIS — M545 Low back pain, unspecified: Secondary | ICD-10-CM

## 2017-07-19 DIAGNOSIS — L989 Disorder of the skin and subcutaneous tissue, unspecified: Secondary | ICD-10-CM

## 2017-07-19 DIAGNOSIS — Z794 Long term (current) use of insulin: Secondary | ICD-10-CM | POA: Diagnosis not present

## 2017-07-19 DIAGNOSIS — E038 Other specified hypothyroidism: Secondary | ICD-10-CM

## 2017-07-19 DIAGNOSIS — R809 Proteinuria, unspecified: Secondary | ICD-10-CM

## 2017-07-19 DIAGNOSIS — I10 Essential (primary) hypertension: Secondary | ICD-10-CM | POA: Diagnosis not present

## 2017-07-19 DIAGNOSIS — E1129 Type 2 diabetes mellitus with other diabetic kidney complication: Secondary | ICD-10-CM

## 2017-07-19 LAB — POCT URINALYSIS DIPSTICK
Bilirubin, UA: NEGATIVE
Blood, UA: NEGATIVE
Glucose, UA: 500
Ketones, UA: NEGATIVE
Nitrite, UA: NEGATIVE
Protein, UA: NEGATIVE
Spec Grav, UA: 1.015 (ref 1.010–1.025)
Urobilinogen, UA: 0.2 E.U./dL
pH, UA: 5.5 (ref 5.0–8.0)

## 2017-07-19 LAB — POCT GLYCOSYLATED HEMOGLOBIN (HGB A1C): Hemoglobin A1C: 6.6

## 2017-07-19 MED ORDER — AMBULATORY NON FORMULARY MEDICATION: Status: DC

## 2017-07-19 NOTE — Progress Notes (Signed)
Subjective:    Patient ID: Yolanda Fields, female    DOB: 21-May-1946, 71 y.o.   MRN: [id number]  HPI Diabetes - no hypoglycemic events. No wounds or sores that are not healing well. No increased thirst or urination. Checking glucose at home. Taking medications as prescribed without any side effects.  Hypertension- Pt denies chest pain, SOB, dizziness, or heart palpitations.  Taking meds as directed w/o problems.  Denies medication side effects.    Hypothyroidism-taking medication consistently.  No recent changes as far as weight, skin, hair, or palpitations.  She is having right flank pain -  She says it bother her a couple of weeks ago after helping her sister wrap presents.  She never took medication for it, or used heat or ice. she says it is better now but wanted to make sure she didn't have an infection.    Review of Systems  BP (!) 140/51   Pulse 85   Ht 5' (1.524 m)   Wt 146 lb (66.2 kg)   SpO2 100%   BMI 28.51 kg/m     Allergies  Allergen Reactions  . Amoxicillin-Pot Clavulanate Swelling  . Tetanus Toxoids Palpitations    Past Medical History:  Diagnosis Date  . Diabetes mellitus without complication ([place])   . Hyperlipidemia   . Hypertension     Past Surgical History:  Procedure Laterality Date  . CESAREAN SECTION      Social History   Socioeconomic History  . Marital status: Divorced    Spouse name: Not on file  . Number of children: 4  . Years of education: HS  . Highest education level: Not on file  Social Needs  . Financial resource strain: Not on file  . Food insecurity - worry: Not on file  . Food insecurity - inability: Not on file  . Transportation needs - medical: Not on file  . Transportation needs - non-medical: Not on file  Occupational History  . Occupation: Retired from [employer]   Tobacco Use  . Smoking status: Never Smoker  . Smokeless tobacco: Never Used  Substance and Sexual Activity  . Alcohol use: No    Alcohol/week: 0.0 oz  .  Drug use: No  . Sexual activity: No  Other Topics Concern  . Not on file  Social History Narrative   Patient is retired from [employer]. She walks for 30 min's a day for exercise, consumes 2 caffeine drinks daily and rates her diet as good.Elouise Munroe         Family History  Problem Relation Age of Onset  . Bladder Cancer Brother   . Cancer Sister   . Heart failure Mother 40  . Diabetes Mother   . Heart failure Brother   . Diabetes Brother   . Pancreatic cancer Brother   . Diabetes Sister   . Coronary artery disease Father 76  . Heart attack Father     Outpatient Encounter Medications as of 07/19/2017  Medication Sig  . amLODipine (NORVASC) 10 MG tablet TAKE 1 TABLET BY MOUTH ONCE DAILY  . atorvastatin (LIPITOR) 10 MG tablet Take 1 tablet (10 mg total) by mouth at bedtime.  . Cholecalciferol 2000 UNITS CAPS Take by mouth.  Wallace Cullens POWD by Does not apply route.  . cyanocobalamin 1000 MCG tablet Take 1,000 mcg by mouth daily.  . Dulaglutide (TRULICITY) 1.5 PJ/8.2NK SOPN INJECT 1.5MG  INTO THE SKIN ONCE A WEEK  . esomeprazole (NEXIUM) 40 MG capsule Take 1 capsule (40  mg total) by mouth 2 (two) times daily before a meal.  . furosemide (LASIX) 40 MG tablet Take 1 tablet (40 mg total) by mouth daily as needed.  . Insulin Detemir (LEVEMIR FLEXTOUCH) 100 UNIT/ML Pen Inject 30 Units into the skin at bedtime.  Marland Kitchen JANUMET 50-1000 MG tablet TAKE ONE TABLET BY MOUTH 2 TIMES A DAY WITH A MEAL  . JARDIANCE 10 MG TABS tablet TAKE ONE TABLET BY MOUTH EVERY DAY  . levothyroxine (SYNTHROID, LEVOTHROID) 100 MCG tablet TAKE ONE TABLET BY MOUTH EVERY DAY  . losartan (COZAAR) 100 MG tablet TAKE 1 TABLET BY MOUTH ONCE DAILY  . metoprolol succinate (TOPROL-XL) 25 MG 24 hr tablet Take 25 mg by mouth daily.  . Multiple Vitamin (MULTIVITAMIN) tablet Take 1 tablet by mouth daily.  . travoprost, benzalkonium, (TRAVATAN) 0.004 % ophthalmic solution 1 drop at bedtime.  Marland Kitchen UNIFINE PENTIPS 32G X 4  MM MISC USE AS DIRECTED  . AMBULATORY NON FORMULARY MEDICATION Medication Name: glucometer and strips.  Whatever her insrance will cover.  Dx diabetes well controlled. Test daily.   No facility-administered encounter medications on file as of 07/19/2017.          Objective:   Physical Exam  Constitutional: She is oriented to person, place, and time. She appears well-developed and well-nourished.  HENT:  Head: Normocephalic and atraumatic.  Neck: Neck supple. No thyromegaly present.  Cardiovascular: Normal rate, regular rhythm and normal heart sounds.  Pulmonary/Chest: Effort normal and breath sounds normal.  Lymphadenopathy:    She has no cervical adenopathy.  Neurological: She is alert and oriented to person, place, and time.  Skin: Skin is warm and dry.  Psychiatric: She has a normal mood and affect. Her behavior is normal.        Assessment & Plan:  DM -WEll controlled.  Hemoglobin A1c down to 6.6 which is fantastic. She is on a statin and ARB.  F/u in 4 months.   HTN - borderline today. Will monitor.    Hypothyroidism- due to recheck thyroid.  Continue current regimen.  We will adjust dose if needed.  Right flank pain -  It seems to have resolved on its own.  It sounds like it was likely musculoskeletal.  If it recurs then okay to try heating pad and anti-inflammatory if needed.  Urinalysis is negative for any infection.  She would like referral to dermatology for full skin check.  She prefers [place] which is where she lives he.  History of squamous cell carcinoma removed from her face last year.

## 2017-07-20 LAB — BASIC METABOLIC PANEL WITH GFR
BUN/Creatinine Ratio: 21 (calc) (ref 6–22)
BUN: 23 mg/dL (ref 7–25)
CO2: 24 mmol/L (ref 20–32)
Calcium: 9.3 mg/dL (ref 8.6–10.4)
Chloride: 105 mmol/L (ref 98–110)
Creat: 1.07 mg/dL — ABNORMAL HIGH (ref 0.60–0.93)
GFR, Est African American: 60 mL/min/{1.73_m2} (ref >=60)
GFR, Est Non African American: 51 mL/min/{1.73_m2} — ABNORMAL LOW (ref >=60)
Glucose, Bld: 106 mg/dL — ABNORMAL HIGH (ref 65–99)
Potassium: 4.5 mmol/L (ref 3.5–5.3)
Sodium: 140 mmol/L (ref 135–146)

## 2017-07-20 LAB — TSH: TSH: 2.32 mIU/L (ref 0.40–4.50)

## 2017-07-21 DIAGNOSIS — Z01419 Encounter for gynecological examination (general) (routine) without abnormal findings: Secondary | ICD-10-CM | POA: Diagnosis not present

## 2017-07-21 DIAGNOSIS — N952 Postmenopausal atrophic vaginitis: Secondary | ICD-10-CM | POA: Diagnosis not present

## 2017-08-09 ENCOUNTER — Other Ambulatory Visit: Payer: Self-pay | Admitting: Family Medicine

## 2017-09-25 ENCOUNTER — Other Ambulatory Visit: Payer: Self-pay | Admitting: Family Medicine

## 2017-09-28 DIAGNOSIS — M79661 Pain in right lower leg: Secondary | ICD-10-CM | POA: Diagnosis not present

## 2017-09-28 DIAGNOSIS — M79662 Pain in left lower leg: Secondary | ICD-10-CM | POA: Diagnosis not present

## 2017-09-28 DIAGNOSIS — B351 Tinea unguium: Secondary | ICD-10-CM | POA: Diagnosis not present

## 2017-09-29 DIAGNOSIS — H401132 Primary open-angle glaucoma, bilateral, moderate stage: Secondary | ICD-10-CM | POA: Diagnosis not present

## 2017-09-29 DIAGNOSIS — E119 Type 2 diabetes mellitus without complications: Secondary | ICD-10-CM | POA: Diagnosis not present

## 2017-09-29 DIAGNOSIS — H5203 Hypermetropia, bilateral: Secondary | ICD-10-CM | POA: Diagnosis not present

## 2017-09-29 DIAGNOSIS — H2513 Age-related nuclear cataract, bilateral: Secondary | ICD-10-CM | POA: Diagnosis not present

## 2017-09-29 LAB — HM DIABETES EYE EXAM

## 2017-10-11 DIAGNOSIS — D1801 Hemangioma of skin and subcutaneous tissue: Secondary | ICD-10-CM | POA: Diagnosis not present

## 2017-10-11 DIAGNOSIS — L821 Other seborrheic keratosis: Secondary | ICD-10-CM | POA: Diagnosis not present

## 2017-10-11 DIAGNOSIS — Z85828 Personal history of other malignant neoplasm of skin: Secondary | ICD-10-CM | POA: Diagnosis not present

## 2017-10-13 ENCOUNTER — Other Ambulatory Visit: Payer: Self-pay | Admitting: Family Medicine

## 2017-10-19 ENCOUNTER — Encounter: Payer: Self-pay | Admitting: Family Medicine

## 2017-10-21 ENCOUNTER — Other Ambulatory Visit: Payer: Self-pay | Admitting: Family Medicine

## 2017-11-16 ENCOUNTER — Encounter: Payer: Self-pay | Admitting: Family Medicine

## 2017-11-16 ENCOUNTER — Ambulatory Visit (INDEPENDENT_AMBULATORY_CARE_PROVIDER_SITE_OTHER): Payer: Medicare Other | Admitting: Family Medicine

## 2017-11-16 VITALS — BP 138/66 | HR 84 | Ht 60.0 in | Wt 145.0 lb

## 2017-11-16 DIAGNOSIS — Z794 Long term (current) use of insulin: Secondary | ICD-10-CM | POA: Diagnosis not present

## 2017-11-16 DIAGNOSIS — I10 Essential (primary) hypertension: Secondary | ICD-10-CM

## 2017-11-16 DIAGNOSIS — E1129 Type 2 diabetes mellitus with other diabetic kidney complication: Secondary | ICD-10-CM

## 2017-11-16 DIAGNOSIS — E038 Other specified hypothyroidism: Secondary | ICD-10-CM

## 2017-11-16 DIAGNOSIS — N1832 Chronic kidney disease, stage 3b: Secondary | ICD-10-CM

## 2017-11-16 DIAGNOSIS — R809 Proteinuria, unspecified: Secondary | ICD-10-CM | POA: Diagnosis not present

## 2017-11-16 DIAGNOSIS — N183 Chronic kidney disease, stage 3 (moderate): Secondary | ICD-10-CM

## 2017-11-16 LAB — POCT GLYCOSYLATED HEMOGLOBIN (HGB A1C): Hemoglobin A1C: 6.7

## 2017-11-16 NOTE — Progress Notes (Signed)
Subjective:    CC: DM, BP  HPI:  Diabetes - no hypoglycemic events. No wounds or sores that are not healing well. No increased thirst or urination. Checking glucose at home. Taking medications as prescribed without any side effects.  Hypertension- Pt denies chest pain, SOB, dizziness, or heart palpitations.  Taking meds as directed w/o problems.  Denies medication side effects.    Hypothyroidism-she is doing well with her thyroid.  No significant skin or hair changes or weight changes.  Last TSH was 2.34 months ago.  She did have some questions about her kidney impairment.  She says that the nurse from her insurance came to the house and mentioned a couple things about her renal function and so she had some questions today.   Past medical history, Surgical history, Family history not pertinant except as noted below, Social history, Allergies, and medications have been entered into the medical record, reviewed, and corrections made.   Review of Systems: No fevers, chills, night sweats, weight loss, chest pain, or shortness of breath.   Objective:    General: Well Developed, well nourished, and in no acute distress.  Neuro: Alert and oriented x3, extra-ocular muscles intact, sensation grossly intact.  HEENT: Normocephalic, atraumatic  Skin: Warm and dry, no rashes. Cardiac: Regular rate and rhythm, no murmurs rubs or gallops, no lower extremity edema.  Respiratory: Clear to auscultation bilaterally. Not using accessory muscles, speaking in full sentences.   Impression and Recommendations:    DM-.  Heme globin A1c 6.7.'s continue work on healthy diet and regular exercise and taking medications regularly.  I would really like to get that A1c down closer to 6.5.  Follow-up in 3 months.  She will be due for labs at that time.  HTN -blood pressure mildly elevated today but she forgot to take her pill last night.  Just make sure taking it regularly and follow-up in 3 months.  CKD  3-discussed diagnosis and that we need to monitor her renal function every 6 months.  She is also on losartan to help protect her kidneys.  Does need to avoid things that can injure the kidneys such as excess NSAIDs.  I printed out a lab slip going back over the last 2 years to show her that her renal function even though it is impaired has been stable.  Hypothyroidism-asymptomatic.

## 2017-11-18 ENCOUNTER — Other Ambulatory Visit: Payer: Self-pay | Admitting: Family Medicine

## 2018-01-28 ENCOUNTER — Other Ambulatory Visit: Payer: Self-pay | Admitting: Family Medicine

## 2018-02-01 ENCOUNTER — Other Ambulatory Visit: Payer: Self-pay | Admitting: Family Medicine

## 2018-02-01 ENCOUNTER — Telehealth: Payer: Self-pay

## 2018-02-01 MED ORDER — FUROSEMIDE 40 MG PO TABS: 40.0000 mg | ORAL_TABLET | Freq: Every day | ORAL | 0 refills | Status: DC | PRN

## 2018-02-01 NOTE — Telephone Encounter (Signed)
Called pt and informed her that this is an as needed medication. She voiced understanding and agreed. Yolanda Fields, Lahoma Crocker, CMA

## 2018-02-01 NOTE — Telephone Encounter (Signed)
PT came in the office today and wanted a refill on Lasix. She has not had a refill since 01/2017. PT states she is still taking this medicine a few times a week. Please advise if PT needs to wait until appt 02/21/18 or can she have this refilled now. Please call PT on her cell phone to advise.

## 2018-02-06 ENCOUNTER — Other Ambulatory Visit: Payer: Self-pay | Admitting: Family Medicine

## 2018-02-21 ENCOUNTER — Encounter: Payer: Self-pay | Admitting: Family Medicine

## 2018-02-21 ENCOUNTER — Ambulatory Visit (INDEPENDENT_AMBULATORY_CARE_PROVIDER_SITE_OTHER): Payer: Medicare Other | Admitting: Family Medicine

## 2018-02-21 VITALS — BP 131/71 | HR 87 | Ht 60.0 in | Wt 147.0 lb

## 2018-02-21 DIAGNOSIS — R809 Proteinuria, unspecified: Secondary | ICD-10-CM

## 2018-02-21 DIAGNOSIS — Z800 Family history of malignant neoplasm of digestive organs: Secondary | ICD-10-CM | POA: Insufficient documentation

## 2018-02-21 DIAGNOSIS — E038 Other specified hypothyroidism: Secondary | ICD-10-CM

## 2018-02-21 DIAGNOSIS — E1129 Type 2 diabetes mellitus with other diabetic kidney complication: Secondary | ICD-10-CM

## 2018-02-21 DIAGNOSIS — E559 Vitamin D deficiency, unspecified: Secondary | ICD-10-CM | POA: Insufficient documentation

## 2018-02-21 DIAGNOSIS — I10 Essential (primary) hypertension: Secondary | ICD-10-CM

## 2018-02-21 DIAGNOSIS — Z794 Long term (current) use of insulin: Secondary | ICD-10-CM

## 2018-02-21 DIAGNOSIS — N289 Disorder of kidney and ureter, unspecified: Secondary | ICD-10-CM | POA: Insufficient documentation

## 2018-02-21 LAB — POCT URINALYSIS DIPSTICK
Bilirubin, UA: NEGATIVE
Blood, UA: NEGATIVE
Glucose, UA: POSITIVE — AB
Ketones, UA: NEGATIVE
Nitrite, UA: NEGATIVE
Protein, UA: NEGATIVE
Spec Grav, UA: 1.015 (ref 1.010–1.025)
Urobilinogen, UA: 0.2 E.U./dL
pH, UA: 5 (ref 5.0–8.0)

## 2018-02-21 LAB — POCT GLYCOSYLATED HEMOGLOBIN (HGB A1C): Hemoglobin A1C: 6.9 % — AB (ref 4.0–5.6)

## 2018-02-21 NOTE — Patient Instructions (Addendum)
Decrease Levemir to 15 units per day.   Call in 2 weeks if not improving

## 2018-02-21 NOTE — Progress Notes (Signed)
Subjective:    CC: DM   HPI:  Diabetes - no hypoglycemic events. No wounds or sores that are not healing well. No increased thirst or urination. Checking glucose at home. Taking medications as prescribed without any side effects. AM sugars have been under 80 about 5 days per week.  If her glucose is around 100 in the morning she wil skip breakfast. Usually has an egg and toast and sometimes will eat cereal.    Hypertension- Pt denies chest pain, SOB, dizziness, or heart palpitations.  Taking meds as directed w/o problems.  Denies medication side effects.    Hypothyroidism - Taking medication regularly in the AM away from food and vitamins, etc. No recent change to skin, hair, or energy levels.   Past medical history, Surgical history, Family history not pertinant except as noted below, Social history, Allergies, and medications have been entered into the medical record, reviewed, and corrections made.   Review of Systems: No fevers, chills, night sweats, weight loss, chest pain, or shortness of breath.   Objective:    General: Well Developed, well nourished, and in no acute distress.  Neuro: Alert and oriented x3, extra-ocular muscles intact, sensation grossly intact.  HEENT: Normocephalic, atraumatic, no thyromegaly.   Skin: Warm and dry, no rashes. Cardiac: Regular rate and rhythm, no murmurs rubs or gallops, no lower extremity edema. No carotid bruits.Marland Kitchen  Respiratory: Clear to auscultation bilaterally. Not using accessory muscles, speaking in full sentences.   Impression and Recommendations:    DM - A1C is up from previous. At 6.9 today.  Discussed options. She is having frequent lows about 5 days per week and then skips eating. Will decrease Levemir to 15 units and have her eat a small breakfast even if sugar is mildly elevated.  F/U in 3 months.  Due for labs.  On statin and ARB.    HTN - Well controlled. Continue current regimen. Follow up in  3 months.   Hypothyhroidism - due  to recheck TSH. Will adjust dose if needed.

## 2018-02-22 LAB — COMPLETE METABOLIC PANEL WITH GFR
AG Ratio: 2.1 (calc) (ref 1.0–2.5)
ALT: 12 U/L (ref 6–29)
AST: 14 U/L (ref 10–35)
Albumin: 4.4 g/dL (ref 3.6–5.1)
Alkaline phosphatase (APISO): 59 U/L (ref 33–130)
BUN/Creatinine Ratio: 27 (calc) — ABNORMAL HIGH (ref 6–22)
BUN: 28 mg/dL — ABNORMAL HIGH (ref 7–25)
CO2: 27 mmol/L (ref 20–32)
Calcium: 9.5 mg/dL (ref 8.6–10.4)
Chloride: 105 mmol/L (ref 98–110)
Creat: 1.04 mg/dL — ABNORMAL HIGH (ref 0.60–0.93)
GFR, Est African American: 61 mL/min/{1.73_m2} (ref >=60)
GFR, Est Non African American: 53 mL/min/{1.73_m2} — ABNORMAL LOW (ref >=60)
Globulin: 2.1 g/dL (calc) (ref 1.9–3.7)
Glucose, Bld: 113 mg/dL — ABNORMAL HIGH (ref 65–99)
Potassium: 4.5 mmol/L (ref 3.5–5.3)
Sodium: 140 mmol/L (ref 135–146)
Total Bilirubin: 1.2 mg/dL (ref 0.2–1.2)
Total Protein: 6.5 g/dL (ref 6.1–8.1)

## 2018-02-22 LAB — LIPID PANEL
Cholesterol: 127 mg/dL (ref <200)
HDL: 47 mg/dL — ABNORMAL LOW (ref >50)
LDL Cholesterol (Calc): 58 mg/dL (calc)
Non-HDL Cholesterol (Calc): 80 mg/dL (calc) (ref <130)
Total CHOL/HDL Ratio: 2.7 (calc) (ref <5.0)
Triglycerides: 135 mg/dL (ref <150)

## 2018-02-22 LAB — TSH: TSH: 2.12 mIU/L (ref 0.40–4.50)

## 2018-02-25 ENCOUNTER — Other Ambulatory Visit: Payer: Self-pay

## 2018-02-25 NOTE — Patient Outreach (Signed)
[facility]) Care Management  02/25/2018  Trace Cederberg [birth year] [id number]   Medication Adherence call to Mrs : Domanique Huesman left a message for patient to call back patient is due on Atorvastatin 10 mg and Losartan 10 mg. Mrs. Jeon is showing past due under [place].   [facility] Direct Dial [phone]  Fax [phone] [email or web address]

## 2018-03-08 ENCOUNTER — Other Ambulatory Visit: Payer: Self-pay | Admitting: Family Medicine

## 2018-03-09 DIAGNOSIS — H401132 Primary open-angle glaucoma, bilateral, moderate stage: Secondary | ICD-10-CM | POA: Diagnosis not present

## 2018-03-26 ENCOUNTER — Other Ambulatory Visit: Payer: Self-pay | Admitting: Family Medicine

## 2018-03-31 ENCOUNTER — Other Ambulatory Visit: Payer: Self-pay

## 2018-03-31 NOTE — Patient Outreach (Signed)
[facility]) Care Management  03/31/2018  Yolanda Fields 01/12/1946 [id number]   Medication Adherence call to Yolanda Fields spoke with patient she said she already pick up Atorvastatin from the pharmacy and has medication at this time. Patient was a bit upset because she has been getting too many call from [facility]. Yolanda Fields is showing past due under Yolanda Fields.   [facility] Management Direct Dial [phone]  Fax [phone] [name]

## 2018-04-14 DIAGNOSIS — L570 Actinic keratosis: Secondary | ICD-10-CM | POA: Diagnosis not present

## 2018-04-14 DIAGNOSIS — D1801 Hemangioma of skin and subcutaneous tissue: Secondary | ICD-10-CM | POA: Diagnosis not present

## 2018-04-14 DIAGNOSIS — L720 Epidermal cyst: Secondary | ICD-10-CM | POA: Diagnosis not present

## 2018-04-14 DIAGNOSIS — L821 Other seborrheic keratosis: Secondary | ICD-10-CM | POA: Diagnosis not present

## 2018-04-22 ENCOUNTER — Other Ambulatory Visit: Payer: Self-pay | Admitting: Family Medicine

## 2018-05-18 ENCOUNTER — Other Ambulatory Visit: Payer: Self-pay | Admitting: Family Medicine

## 2018-05-19 ENCOUNTER — Other Ambulatory Visit: Payer: Self-pay | Admitting: Family Medicine

## 2018-05-24 ENCOUNTER — Ambulatory Visit (INDEPENDENT_AMBULATORY_CARE_PROVIDER_SITE_OTHER): Payer: Medicare Other | Admitting: Family Medicine

## 2018-05-24 ENCOUNTER — Encounter: Payer: Self-pay | Admitting: Family Medicine

## 2018-05-24 VITALS — BP 130/55 | HR 104 | Ht 60.43 in | Wt 146.0 lb

## 2018-05-24 DIAGNOSIS — Z794 Long term (current) use of insulin: Secondary | ICD-10-CM | POA: Diagnosis not present

## 2018-05-24 DIAGNOSIS — Z23 Encounter for immunization: Secondary | ICD-10-CM | POA: Diagnosis not present

## 2018-05-24 DIAGNOSIS — R809 Proteinuria, unspecified: Secondary | ICD-10-CM

## 2018-05-24 DIAGNOSIS — I10 Essential (primary) hypertension: Secondary | ICD-10-CM

## 2018-05-24 DIAGNOSIS — E1129 Type 2 diabetes mellitus with other diabetic kidney complication: Secondary | ICD-10-CM | POA: Diagnosis not present

## 2018-05-24 LAB — POCT GLYCOSYLATED HEMOGLOBIN (HGB A1C): Hemoglobin A1C: 6.3 % — AB (ref 4.0–5.6)

## 2018-05-24 MED ORDER — AMBULATORY NON FORMULARY MEDICATION: 0 refills | Status: DC

## 2018-05-24 NOTE — Progress Notes (Signed)
Subjective:    CC: HTN and DM  HPI:  Diabetes - no hypoglycemic events. No wounds or sores that are not healing well. No increased thirst or urination. Checking glucose at home. Taking medications as prescribed without any side effects.  Is asking for a new meter and test strips currently. She has been working on her diet.    Hypertension- Pt denies chest pain, SOB, dizziness, or heart palpitations.  Taking meds as directed w/o problems.  Denies medication side effects.     Past medical history, Surgical history, Family history not pertinant except as noted below, Social history, Allergies, and medications have been entered into the medical record, reviewed, and corrections made.   Review of Systems: No fevers, chills, night sweats, weight loss, chest pain, or shortness of breath.   Objective:    General: Well Developed, well nourished, and in no acute distress.  Neuro: Alert and oriented x3, extra-ocular muscles intact, sensation grossly intact.  HEENT: Normocephalic, atraumatic  Skin: Warm and dry, no rashes. Cardiac: Regular rate and rhythm, no murmurs rubs or gallops, no lower extremity edema.  Respiratory: Clear to auscultation bilaterally. Not using accessory muscles, speaking in full sentences.   Impression and Recommendations:    DM - A1C much improved from last time.  Well controlled. Continue current regimen. Follow up in  4 months.  Ne rx for meter given.    HTN  - Well controlled. Continue current regimen. Follow up in  4 month.

## 2018-06-08 ENCOUNTER — Other Ambulatory Visit: Payer: Self-pay | Admitting: Family Medicine

## 2018-06-13 ENCOUNTER — Other Ambulatory Visit: Payer: Self-pay | Admitting: Family Medicine

## 2018-06-13 DIAGNOSIS — Z1231 Encounter for screening mammogram for malignant neoplasm of breast: Secondary | ICD-10-CM | POA: Diagnosis not present

## 2018-06-13 LAB — HM MAMMOGRAPHY

## 2018-07-07 ENCOUNTER — Encounter: Payer: Self-pay | Admitting: Family Medicine

## 2018-07-18 ENCOUNTER — Other Ambulatory Visit: Payer: Self-pay | Admitting: Family Medicine

## 2018-08-03 DIAGNOSIS — H401132 Primary open-angle glaucoma, bilateral, moderate stage: Secondary | ICD-10-CM | POA: Diagnosis not present

## 2018-08-15 ENCOUNTER — Other Ambulatory Visit: Payer: Self-pay | Admitting: Family Medicine

## 2018-08-31 NOTE — Progress Notes (Signed)
Subjective:   Noelie Renfrow is a 73 y.o. female who presents for Medicare Annual (Subsequent) preventive examination.  Review of Systems:  No ROS.  Medicare Wellness Visit. Additional risk factors are reflected in the social history.  Cardiac Risk Factors include: advanced age (>67men, >54 women);diabetes mellitus;hypertension Sleep patterns: getting 6-8 hours of sleep a night. Wakes up to go to the bathroom 2 times a night. Wakes up feeling rested. Takes a nap during the day.   Home Safety/Smoke Alarms: Feels safe in home. Smoke alarms in place.  Living environment; Lives alone in a 2 story condo. Stairs have hand rails in place. Shower is a step over tub and no grab bars in place. Advised patient to take her phone in the bathroom while showering. Seat Belt Safety/Bike Helmet: Wears seat belt.   Female:   Pap-  Aged out     Mammo-  utd     Dexa scan-  utd      CCS-  utd      Objective:     Vitals: BP (!) 133/51 (BP Location: Left Arm, Patient Position: Sitting, Cuff Size: Normal)   Pulse 72   Ht 5' (1.524 m)   Wt 149 lb (67.6 kg)   SpO2 99%   BMI 29.10 kg/m   Body mass index is 29.1 kg/m.  Advanced Directives 09/07/2018 09/24/2016  Does Patient Have a Medical Advance Directive? No No  Would patient like information on creating a medical advance directive? No - Patient declined No - Patient declined    Tobacco Social History   Tobacco Use  Smoking Status Never Smoker  Smokeless Tobacco Never Used     Counseling given: No   Clinical Intake:  Pre-visit preparation completed: Yes  Pain : No/denies pain     Nutritional Risks: None  How often do you need to have someone help you when you read instructions, pamphlets, or other written materials from your doctor or pharmacy?: 1 - Never What is the last grade level you completed in school?: 12  Interpreter Needed?: No  Information entered by :: Orlie Dakin, LPN  Past Medical History:  Diagnosis Date  .  Diabetes mellitus without complication ([place])   . Hyperlipidemia   . Hypertension    Past Surgical History:  Procedure Laterality Date  . CESAREAN SECTION     Family History  Problem Relation Age of Onset  . Bladder Cancer Brother   . Cancer Sister   . Heart failure Mother 39  . Diabetes Mother   . Heart failure Brother   . Diabetes Brother   . Pancreatic cancer Brother   . Diabetes Sister   . Coronary artery disease Father 60  . Heart attack Father    Social History   Socioeconomic History  . Marital status: Divorced    Spouse name: Not on file  . Number of children: 4  . Years of education: HS  . Highest education level: 12th grade  Occupational History  . Occupation: Retired from [employer]  . Financial resource strain: Not hard at all  . Food insecurity:    Worry: Never true    Inability: Never true  . Transportation needs:    Medical: No    Non-medical: No  Tobacco Use  . Smoking status: Never Smoker  . Smokeless tobacco: Never Used  Substance and Sexual Activity  . Alcohol use: No    Alcohol/week: 0.0 standard drinks  . Drug use: No  .  Sexual activity: Not Currently  Lifestyle  . Physical activity:    Days per week: 0 days    Minutes per session: 0 min  . Stress: Not at all  Relationships  . Social connections:    Talks on phone: More than three times a week    Gets together: Once a week    Attends religious service: More than 4 times per year    Active member of club or organization: No    Attends meetings of clubs or organizations: Never    Relationship status: Divorced  Other Topics Concern  . Not on file  Social History Narrative   Patient is retired from [employer].  consumes 2 caffeine drinks daily and rates her diet as good. Will get back into exercising once weather is good again.         Outpatient Encounter Medications as of 09/07/2018  Medication Sig  . AMBULATORY NON FORMULARY MEDICATION Medication Name: glucometer and strips.   Whatever her insrance will cover.  Dx E11.9Test daily.  Marland Kitchen amLODipine (NORVASC) 10 MG tablet TAKE ONE TABLET BY MOUTH EVERY DAY  . atorvastatin (LIPITOR) 10 MG tablet TAKE ONE TABLET BY MOUTH AT BEDTIME  . Cholecalciferol 2000 UNITS CAPS Take by mouth.  Wallace Cullens POWD by Does not apply route.  . cyanocobalamin 1000 MCG tablet Take 1,000 mcg by mouth daily.  Marland Kitchen esomeprazole (NEXIUM) 40 MG capsule TAKE ONE CAPSULE BY MOUTH TWO TIMES A DAY BEFORE A MEAL.  . furosemide (LASIX) 40 MG tablet Take 1 tablet (40 mg total) by mouth daily as needed.  Marland Kitchen JANUMET 50-1000 MG tablet Take 1 tablet by mouth 2 (two) times daily with a meal.  . JARDIANCE 10 MG TABS tablet TAKE ONE TABLET BY MOUTH EVERY DAY  . LEVEMIR FLEXTOUCH 100 UNIT/ML Pen Inject 30 Units into the skin at bedtime.  Marland Kitchen levothyroxine (SYNTHROID, LEVOTHROID) 100 MCG tablet TAKE ONE TABLET BY MOUTH EVERY DAY  . losartan (COZAAR) 100 MG tablet TAKE 1 TABLET BY MOUTH ONCE DAILY  . metoprolol succinate (TOPROL-XL) 25 MG 24 hr tablet Take 25 mg by mouth daily.  . Multiple Vitamin (MULTIVITAMIN) tablet Take 1 tablet by mouth daily.  . travoprost, benzalkonium, (TRAVATAN) 0.004 % ophthalmic solution 1 drop at bedtime.  . TRUE METRIX BLOOD GLUCOSE TEST test strip TEST DAILY  . TRULICITY 1.5 HA/1.9FX SOPN INJECT 1.5MG  INTO THE SKIN ONCE A WEEK  . UNIFINE PENTIPS 32G X 4 MM MISC USE AS DIRECTED  . [DISCONTINUED] UNIFINE PENTIPS 32G X 4 MM MISC USE AS DIRECTED   No facility-administered encounter medications on file as of 09/07/2018.     Activities of Daily Living In your present state of health, do you have any difficulty performing the following activities: 09/07/2018  Hearing? Y  Comment hearing aids in both ears. Weraing for the last 10 years  Vision? N  Difficulty concentrating or making decisions? N  Walking or climbing stairs? N  Dressing or bathing? N  Doing errands, shopping? N  Preparing Food and eating ? N  Using the Toilet? N  In  the past six months, have you accidently leaked urine? N  Do you have problems with loss of bowel control? N  Managing your Medications? N  Managing your Finances? N  Housekeeping or managing your Housekeeping? N  Some recent data might be hidden    Patient Care Team: [name], MD as PCP - General (Family Medicine)    Assessment:   This  is a routine wellness examination for [place].Physical assessment deferred to PCP.     Exercise Activities and Dietary recommendations Current Exercise Habits: The patient does not participate in regular exercise at present, Exercise limited by: None identified Diet Eats a healthy diet full of fruits, and vegetables. Watches what she eats due to the diabetes. Breakfast: Cinnamon bread or a egg and bacon, or cereal. Lunch: leftovers, banana sandwich Dinner: Meat and vegetables greens. Drinking about 3 bottles of water a day.      Goals    . Patient Stated     Would like to be off the insulin in 2020. Start back walking everyday for at least 30 minutes a day    . Weight (lb) < 200 lb (90.7 kg) (pt-stated)       Fall Risk Fall Risk  09/07/2018 07/19/2017 12/25/2016 09/24/2016 08/26/2015  Falls in the past year? 0 No No No No  Follow up Falls prevention discussed - - - -   Is the patient's home free of loose throw rugs in walkways, pet beds, electrical cords, etc?   yes      Grab bars in the bathroom? no      Handrails on the stairs?   yes      Adequate lighting?   yes  Depression Screen PHQ 2/9 Scores 09/07/2018 02/21/2018 02/21/2018 07/19/2017  PHQ - 2 Score 0 0 0 0     Cognitive Function     6CIT Screen 09/07/2018 09/24/2016  What Year? 0 points 0 points  What month? 0 points 0 points  What time? 0 points 0 points  Count back from 20 0 points 0 points  Months in reverse 0 points 0 points  Repeat phrase 2 points 0 points  Total Score 2 0    Immunization History  Administered Date(s) Administered  . Influenza, High Dose Seasonal  PF 04/13/2017, 05/24/2018  . Influenza, Seasonal, Injecte, Preservative Fre 06/10/2011, 04/21/2012  . Influenza,inj,Quad PF,6+ Mos 04/20/2013, 03/27/2016  . Influenza-Unspecified 04/29/2015  . Pneumococcal Conjugate-13 07/23/2014, 12/26/2015, 04/20/2016  . Pneumococcal Polysaccharide-23 11/28/2009  . Zoster 04/25/2012    Screening Tests Health Maintenance  Topic Date Due  . OPHTHALMOLOGY EXAM  09/30/2018  . FOOT EXAM  11/17/2018  . HEMOGLOBIN A1C  11/22/2018  . MAMMOGRAM  06/13/2020  . COLONOSCOPY  05/08/2022  . DEXA SCAN  10/07/2026  . INFLUENZA VACCINE  Completed  . Hepatitis C Screening  Completed  . PNA vac Low Risk Adult  Completed  Plan: Please schedule your next medicare wellness visit with me in 1 yr.  Ms. Ellinwood , Thank you for taking time to come for your Medicare Wellness Visit. I appreciate your ongoing commitment to your health goals. Please review the following plan we discussed and let me know if I can assist you in the future.  Continue doing brain stimulating activities (puzzles, reading, adult coloring books, staying active) to keep memory sharp.    These are the goals we discussed: Goals    . Patient Stated     Would like to be off the insulin in 2020. Start back walking everyday for at least 30 minutes a day    . Weight (lb) < 200 lb (90.7 kg) (pt-stated)       This is a list of the screening recommended for you and due dates:  Health Maintenance  Topic Date Due  . Eye exam for diabetics  09/30/2018  . Complete foot exam   11/17/2018  . Hemoglobin A1C  11/22/2018  . Mammogram  06/13/2020  . Colon Cancer Screening  05/08/2022  . DEXA scan (bone density measurement)  10/07/2026  . Flu Shot  Completed  .  Hepatitis C: One time screening is recommended by Center for Disease Control  (CDC) for  adults born from [birth year] through 1967.   Completed  . Pneumonia vaccines  Completed           I have personally reviewed and noted the following in the  patient's chart:   . Medical and social history . Use of alcohol, tobacco or illicit drugs  . Current medications and supplements . Functional ability and status . Nutritional status . Physical activity . Advanced directives . List of other physicians . Hospitalizations, surgeries, and ER visits in previous 12 months . Vitals . Screenings to include cognitive, depression, and falls . Referrals and appointments  In addition, I have reviewed and discussed with patient certain preventive protocols, quality metrics, and best practice recommendations. A written personalized care plan for preventive services as well as general preventive health recommendations were provided to patient.     Joanne Chars, LPN  7/59/1638

## 2018-09-04 ENCOUNTER — Other Ambulatory Visit: Payer: Self-pay | Admitting: Family Medicine

## 2018-09-07 ENCOUNTER — Ambulatory Visit (INDEPENDENT_AMBULATORY_CARE_PROVIDER_SITE_OTHER): Payer: Medicare Other | Admitting: *Deleted

## 2018-09-07 VITALS — BP 133/51 | HR 72 | Ht 60.0 in | Wt 149.0 lb

## 2018-09-07 DIAGNOSIS — Z0000 Encounter for general adult medical examination without abnormal findings: Secondary | ICD-10-CM

## 2018-09-07 NOTE — Patient Instructions (Signed)
Plan: Please schedule your next medicare wellness visit with me in 1 yr.  Yolanda Fields , Thank you for taking time to come for your Medicare Wellness Visit. I appreciate your ongoing commitment to your health goals. Please review the following plan we discussed and let me know if I can assist you in the future.  Continue doing brain stimulating activities (puzzles, reading, adult coloring books, staying active) to keep memory sharp.    These are the goals we discussed: Goals    . Patient Stated     Would like to be off the insulin in 2020. Start back walking everyday for at least 30 minutes a day    . Weight (lb) < 200 lb (90.7 kg) (pt-stated)      Health Maintenance After Age 84 After age 69, you are at a higher risk for certain long-term diseases and infections as well as injuries from falls. Falls are a major cause of broken bones and head injuries in people who are older than age 47. Getting regular preventive care can help to keep you healthy and well. Preventive care includes getting regular testing and making lifestyle changes as recommended by your health care provider. Talk with your health care provider about:  Which screenings and tests you should have. A screening is a test that checks for a disease when you have no symptoms.  A diet and exercise plan that is right for you. What should I know about screenings and tests to prevent falls? Screening and testing are the best ways to find a health problem early. Early diagnosis and treatment give you the best chance of managing medical conditions that are common after age 41. Certain conditions and lifestyle choices may make you more likely to have a fall. Your health care provider may recommend:  Regular vision checks. Poor vision and conditions such as cataracts can make you more likely to have a fall. If you wear glasses, make sure to get your prescription updated if your vision changes.  Medicine review. Work with your health care  provider to regularly review all of the medicines you are taking, including over-the-counter medicines. Ask your health care provider about any side effects that may make you more likely to have a fall. Tell your health care provider if any medicines that you take make you feel dizzy or sleepy.  Osteoporosis screening. Osteoporosis is a condition that causes the bones to get weaker. This can make the bones weak and cause them to break more easily.  Blood pressure screening. Blood pressure changes and medicines to control blood pressure can make you feel dizzy.  Strength and balance checks. Your health care provider may recommend certain tests to check your strength and balance while standing, walking, or changing positions.  Foot health exam. Foot pain and numbness, as well as not wearing proper footwear, can make you more likely to have a fall.  Depression screening. You may be more likely to have a fall if you have a fear of falling, feel emotionally low, or feel unable to do activities that you used to do.  Alcohol use screening. Using too much alcohol can affect your balance and may make you more likely to have a fall. What actions can I take to lower my risk of falls? General instructions  Talk with your health care provider about your risks for falling. Tell your health care provider if: ? You fall. Be sure to tell your health care provider about all falls, even ones that  seem minor. ? You feel dizzy, sleepy, or off-balance.  Take over-the-counter and prescription medicines only as told by your health care provider. These include any supplements.  Eat a healthy diet and maintain a healthy weight. A healthy diet includes low-fat dairy products, low-fat (lean) meats, and fiber from whole grains, beans, and lots of fruits and vegetables. Home safety  Remove any tripping hazards, such as rugs, cords, and clutter.  Install safety equipment such as grab bars in bathrooms and safety rails  on stairs.  Keep rooms and walkways well-lit. Activity   Follow a regular exercise program to stay fit. This will help you maintain your balance. Ask your health care provider what types of exercise are appropriate for you.  If you need a cane or walker, use it as recommended by your health care provider.  Wear supportive shoes that have nonskid soles. Lifestyle  Do not drink alcohol if your health care provider tells you not to drink.  If you drink alcohol, limit how much you have: ? 0-1 drink a day for women. ? 0-2 drinks a day for men.  Be aware of how much alcohol is in your drink. In the [place] one drink equals one typical bottle of beer (12 oz), one-half glass of wine (5 oz), or one shot of hard liquor (1 oz).  Do not use any products that contain nicotine or tobacco, such as cigarettes and e-cigarettes. If you need help quitting, ask your health care provider. Summary  Having a healthy lifestyle and getting preventive care can help to protect your health and wellness after age 34.  Screening and testing are the best way to find a health problem early and help you avoid having a fall. Early diagnosis and treatment give you the best chance for managing medical conditions that are more common for people who are older than age 5.  Falls are a major cause of broken bones and head injuries in people who are older than age 17. Take precautions to prevent a fall at home.  Work with your health care provider to learn what changes you can make to improve your health and wellness and to prevent falls. This information is not intended to replace advice given to you by your health care provider. Make sure you discuss any questions you have with your health care provider. Document Released: 05/19/2017 Document Revised: 05/19/2017 Document Reviewed: 05/19/2017 Elsevier Interactive Patient Education  2019 [employer].

## 2018-09-10 ENCOUNTER — Other Ambulatory Visit: Payer: Self-pay | Admitting: Family Medicine

## 2018-09-22 ENCOUNTER — Encounter: Payer: Self-pay | Admitting: Family Medicine

## 2018-09-22 ENCOUNTER — Ambulatory Visit (INDEPENDENT_AMBULATORY_CARE_PROVIDER_SITE_OTHER): Payer: Medicare Other | Admitting: Family Medicine

## 2018-09-22 VITALS — BP 149/46 | HR 93 | Ht 60.0 in | Wt 147.0 lb

## 2018-09-22 DIAGNOSIS — I10 Essential (primary) hypertension: Secondary | ICD-10-CM | POA: Diagnosis not present

## 2018-09-22 DIAGNOSIS — Z794 Long term (current) use of insulin: Secondary | ICD-10-CM | POA: Diagnosis not present

## 2018-09-22 DIAGNOSIS — E1129 Type 2 diabetes mellitus with other diabetic kidney complication: Secondary | ICD-10-CM | POA: Diagnosis not present

## 2018-09-22 DIAGNOSIS — N183 Chronic kidney disease, stage 3 (moderate): Secondary | ICD-10-CM | POA: Diagnosis not present

## 2018-09-22 DIAGNOSIS — R809 Proteinuria, unspecified: Secondary | ICD-10-CM | POA: Diagnosis not present

## 2018-09-22 DIAGNOSIS — N1832 Chronic kidney disease, stage 3b: Secondary | ICD-10-CM

## 2018-09-22 LAB — POCT GLYCOSYLATED HEMOGLOBIN (HGB A1C): Hemoglobin A1C: 6.7 % — AB (ref 4.0–5.6)

## 2018-09-22 NOTE — Progress Notes (Signed)
Subjective:    CC: DM f/u   HPI:  Diabetes - no hypoglycemic events. No wounds or sores that are not healing well. No increased thirst or urination. Checking glucose at home. Taking medications as prescribed without any side effects.  Hypertension- Pt denies chest pain, SOB, dizziness, or heart palpitations.  Taking meds as directed w/o problems.  Denies medication side effects.    F/U CKD 3 -   No concerns or changes.   Hypothyroidism - Taking medication regularly in the AM away from food and vitamins, etc. No recent change to skin, hair, or energy levels.   Past medical history, Surgical history, Family history not pertinant except as noted below, Social history, Allergies, and medications have been entered into the medical record, reviewed, and corrections made.   Review of Systems: No fevers, chills, night sweats, weight loss, chest pain, or shortness of breath.   Objective:    General: Well Developed, well nourished, and in no acute distress.  Neuro: Alert and oriented x3, extra-ocular muscles intact, sensation grossly intact.  HEENT: Normocephalic, atraumatic  Skin: Warm and dry, no rashes. Cardiac: Regular rate and rhythm, no murmurs rubs or gallops, no lower extremity edema.  Respiratory: Clear to auscultation bilaterally. Not using accessory muscles, speaking in full sentences.   Impression and Recommendations:    Diabetes - no hypoglycemic events. No wounds or sores that are not healing well. No increased thirst or urination. Checking glucose at home. Taking medications as prescribed without any side effects.  Her medication regimen.  Just reminded her to get back on track with her diet as her A1c did go up just slightly.  A1c of 6.7 today.  HTN  -pressure is still a little high on repeat today [facility] and have her come back in 2 weeks for nurse visit.  Normally it looks better than this.  She did take her medication today.  CKD 3-due to recheck renal  function.  Hypothyroidism-due to recheck TSH.  She feels asymptomatic.

## 2018-09-23 ENCOUNTER — Other Ambulatory Visit: Payer: Self-pay | Admitting: Family Medicine

## 2018-09-23 LAB — BASIC METABOLIC PANEL WITH GFR
BUN/Creatinine Ratio: 19 (calc) (ref 6–22)
BUN: 22 mg/dL (ref 7–25)
CO2: 24 mmol/L (ref 20–32)
Calcium: 9.1 mg/dL (ref 8.6–10.4)
Chloride: 102 mmol/L (ref 98–110)
Creat: 1.16 mg/dL — ABNORMAL HIGH (ref 0.60–0.93)
GFR, Est African American: 54 mL/min/{1.73_m2} — ABNORMAL LOW (ref >=60)
GFR, Est Non African American: 46 mL/min/{1.73_m2} — ABNORMAL LOW (ref >=60)
Glucose, Bld: 141 mg/dL — ABNORMAL HIGH (ref 65–99)
Potassium: 4.8 mmol/L (ref 3.5–5.3)
Sodium: 136 mmol/L (ref 135–146)

## 2018-09-23 LAB — TSH: TSH: 2.32 mIU/L (ref 0.40–4.50)

## 2018-10-06 ENCOUNTER — Other Ambulatory Visit: Payer: Self-pay | Admitting: Family Medicine

## 2018-11-11 ENCOUNTER — Telehealth: Payer: Self-pay | Admitting: Family Medicine

## 2018-11-11 DIAGNOSIS — R6889 Other general symptoms and signs: Secondary | ICD-10-CM

## 2018-11-11 NOTE — Telephone Encounter (Signed)
Left VM for Pt to return clinic call regarding results, callback information provided. 

## 2018-11-11 NOTE — Telephone Encounter (Signed)
Please call patient and let her know that I did receive her screening ABIs of her legs when the insurance company came out and did their evaluation at home.  Her right foot look normal and had good blood flow but there was a little bit of impairment in the left foot.  So I would like to schedule her more for more formal ABIs this summer once the state home orders have been lifted and it safer for her to come in to be evaluated.  These are usually scheduled in [place].  We will go ahead and place order today but they will likely schedule her for sometime this summer.

## 2018-11-23 ENCOUNTER — Encounter (HOSPITAL_BASED_OUTPATIENT_CLINIC_OR_DEPARTMENT_OTHER): Payer: Medicare Other

## 2018-11-30 ENCOUNTER — Ambulatory Visit (HOSPITAL_BASED_OUTPATIENT_CLINIC_OR_DEPARTMENT_OTHER)
Admission: RE | Admit: 2018-11-30 | Discharge: 2018-11-30 | Disposition: A | Discharge status: Home / Self Care | Payer: Medicare Other | Source: Ambulatory Visit | Attending: Family Medicine | Admitting: Family Medicine

## 2018-11-30 ENCOUNTER — Other Ambulatory Visit: Payer: Self-pay

## 2018-11-30 DIAGNOSIS — R6889 Other general symptoms and signs: Secondary | ICD-10-CM | POA: Diagnosis not present

## 2018-12-05 ENCOUNTER — Telehealth: Payer: Self-pay

## 2018-12-05 DIAGNOSIS — E119 Type 2 diabetes mellitus without complications: Secondary | ICD-10-CM | POA: Diagnosis not present

## 2018-12-05 DIAGNOSIS — H2513 Age-related nuclear cataract, bilateral: Secondary | ICD-10-CM | POA: Diagnosis not present

## 2018-12-05 DIAGNOSIS — H5203 Hypermetropia, bilateral: Secondary | ICD-10-CM | POA: Diagnosis not present

## 2018-12-05 DIAGNOSIS — H401132 Primary open-angle glaucoma, bilateral, moderate stage: Secondary | ICD-10-CM | POA: Diagnosis not present

## 2018-12-05 NOTE — Telephone Encounter (Signed)
Yolanda Fields called for the results to the ABI's she had last week. Please advise.

## 2018-12-05 NOTE — Telephone Encounter (Signed)
It still says preliminary so I will check into it.

## 2018-12-06 NOTE — Telephone Encounter (Signed)
Patient advised.

## 2018-12-19 ENCOUNTER — Other Ambulatory Visit: Payer: Self-pay | Admitting: Family Medicine

## 2019-01-02 ENCOUNTER — Ambulatory Visit (INDEPENDENT_AMBULATORY_CARE_PROVIDER_SITE_OTHER): Payer: Medicare Other | Admitting: Family Medicine

## 2019-01-02 ENCOUNTER — Encounter: Payer: Self-pay | Admitting: Family Medicine

## 2019-01-02 VITALS — BP 136/72 | HR 107 | Ht 60.0 in | Wt 153.0 lb

## 2019-01-02 DIAGNOSIS — R809 Proteinuria, unspecified: Secondary | ICD-10-CM | POA: Diagnosis not present

## 2019-01-02 DIAGNOSIS — I77819 Aortic ectasia, unspecified site: Secondary | ICD-10-CM

## 2019-01-02 DIAGNOSIS — I10 Essential (primary) hypertension: Secondary | ICD-10-CM | POA: Diagnosis not present

## 2019-01-02 DIAGNOSIS — Z794 Long term (current) use of insulin: Secondary | ICD-10-CM | POA: Diagnosis not present

## 2019-01-02 DIAGNOSIS — E1129 Type 2 diabetes mellitus with other diabetic kidney complication: Secondary | ICD-10-CM

## 2019-01-02 LAB — POCT GLYCOSYLATED HEMOGLOBIN (HGB A1C): Hemoglobin A1C: 7 % — AB (ref 4.0–5.6)

## 2019-01-02 NOTE — Progress Notes (Signed)
Established Patient Office Visit  Subjective:  Patient ID: Yolanda Fields, female    DOB: Oct 21, 1945  Age: 73 y.o. MRN: [id number]  CC:  Chief Complaint  Patient presents with  . Hypertension  . Diabetes    HPI Yolanda Fields presents for   Hypertension- Pt denies chest pain, SOB, dizziness, or heart palpitations.  Taking meds as directed w/o problems.  Denies medication side effects.   Diabetes - no hypoglycemic events. No wounds or sores that are not healing well. No increased thirst or urination. Checking glucose at home. Taking medications as prescribed without any side effects. Using 30 units of Levemir. She has gained 6 lbs since last here in March.    F/U CKD 3 -no change in urination.  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 Recommend repeat ultrasound in 5 years which would be October 2021. No recent CP or SOB.    Also had just a little bit of phlegm in her upper chest.  She said some postnasal drip she said she normally just takes her allergy pill and some nasal spray and that clears it up.  She says her nose cleared up pretty well but she still feels like she is got the phlegm present.  No fevers chills or sweats.  No significant cough or shortness of breath.  Past Medical History:  Diagnosis Date  . Diabetes mellitus without complication ([place])   . Hyperlipidemia   . Hypertension     Past Surgical History:  Procedure Laterality Date  . CESAREAN SECTION      Family History  Problem Relation Age of Onset  . Bladder Cancer Brother   . Cancer Sister   . Heart failure Mother 37  . Diabetes Mother   . Heart failure Brother   . Diabetes Brother   . Pancreatic cancer Brother   . Diabetes Sister   . Coronary artery disease Father 16  . Heart attack Father     Social History   Socioeconomic History  . Marital status: Divorced    Spouse name: Not on file  . Number of children: 4  . Years of education: HS  . Highest education level: 12th grade   Occupational History  . Occupation: Retired from [employer]  . Financial resource strain: Not hard at all  . Food insecurity    Worry: Never true    Inability: Never true  . Transportation needs    Medical: No    Non-medical: No  Tobacco Use  . Smoking status: Never Smoker  . Smokeless tobacco: Never Used  Substance and Sexual Activity  . Alcohol use: No    Alcohol/week: 0.0 standard drinks  . Drug use: No  . Sexual activity: Not Currently  Lifestyle  . Physical activity    Days per week: 0 days    Minutes per session: 0 min  . Stress: Not at all  Relationships  . Social connections    Talks on phone: More than three times a week    Gets together: Once a week    Attends religious service: More than 4 times per year    Active member of club or organization: No    Attends meetings of clubs or organizations: Never    Relationship status: Divorced  . Intimate partner violence    Fear of current or ex partner: No    Emotionally abused: No    Physically abused: No    Forced sexual activity: No  Other Topics Concern  . Not on file  Social History Narrative   Patient is retired from [employer].  consumes 2 caffeine drinks daily and rates her diet as good. Will get back into exercising once weather is good again.         Outpatient Medications Prior to Visit  Medication Sig Dispense Refill  . AMBULATORY NON FORMULARY MEDICATION Medication Name: glucometer and strips.  Whatever her insrance will cover.  Dx E11.9Test daily. 1 Units 0  . amLODipine (NORVASC) 10 MG tablet TAKE ONE TABLET BY MOUTH EVERY DAY 90 tablet 1  . atorvastatin (LIPITOR) 10 MG tablet TAKE ONE TABLET BY MOUTH AT BEDTIME 90 tablet 3  . Cholecalciferol 2000 UNITS CAPS Take by mouth.    Wallace Cullens POWD by Does not apply route.    . cyanocobalamin 1000 MCG tablet Take 1,000 mcg by mouth daily.    Marland Kitchen esomeprazole (NEXIUM) 40 MG capsule TAKE ONE CAPSULE BY MOUTH TWO TIMES A DAY BEFORE A MEAL. 180 capsule  3  . furosemide (LASIX) 40 MG tablet Take 1 tablet (40 mg total) by mouth daily as needed. 90 tablet 1  . JANUMET 50-1000 MG tablet Take 1 tablet by mouth 2 (two) times daily with a meal. 180 tablet 1  . JARDIANCE 10 MG TABS tablet TAKE ONE TABLET BY MOUTH EVERY DAY 90 tablet 2  . LEVEMIR FLEXTOUCH 100 UNIT/ML Pen Inject 30 Units into the skin at bedtime. 45 mL 3  . levothyroxine (SYNTHROID, LEVOTHROID) 100 MCG tablet TAKE ONE TABLET BY MOUTH EVERY DAY 90 tablet 2  . losartan (COZAAR) 100 MG tablet TAKE 1 TABLET BY MOUTH ONCE DAILY 90 tablet 1  . metoprolol succinate (TOPROL-XL) 25 MG 24 hr tablet Take 25 mg by mouth daily.    . Multiple Vitamin (MULTIVITAMIN) tablet Take 1 tablet by mouth daily.    . travoprost, benzalkonium, (TRAVATAN) 0.004 % ophthalmic solution 1 drop at bedtime.    . TRUE METRIX BLOOD GLUCOSE TEST test strip TEST DAILY 100 each 11  . TRULICITY 1.5 JO/8.4ZY SOPN INJECT 1.5MG  INTO THE SKIN ONCE A WEEK 6 mL 3  . UNIFINE PENTIPS 32G X 4 MM MISC USE AS DIRECTED 100 each 3   No facility-administered medications prior to visit.     Allergies  Allergen Reactions  . Amoxicillin-Pot Clavulanate Swelling  . Tetanus Toxoids Palpitations    ROS Review of Systems    Objective:    Physical Exam  Constitutional: She is oriented to person, place, and time. She appears well-developed and well-nourished.  HENT:  Head: Normocephalic and atraumatic.  Cardiovascular: Normal rate, regular rhythm and normal heart sounds.  Pulmonary/Chest: Effort normal and breath sounds normal.  Neurological: She is alert and oriented to person, place, and time.  Skin: Skin is warm and dry.  Psychiatric: She has a normal mood and affect. Her behavior is normal.    BP 136/72   Pulse (!) 107   Ht 5' (1.524 m)   Wt 153 lb (69.4 kg)   SpO2 98%   BMI 29.88 kg/m  Wt Readings from Last 3 Encounters:  01/02/19 153 lb (69.4 kg)  09/22/18 147 lb (66.7 kg)  09/07/18 149 lb (67.6 kg)      Health Maintenance Due  Topic Date Due  . OPHTHALMOLOGY EXAM  09/30/2018    There are no preventive care reminders to display for this patient.  Lab Results  Component Value Date   TSH 2.32 09/22/2018   No  results found for: WBC, HGB, HCT, MCV, PLT Lab Results  Component Value Date   NA 136 09/22/2018   K 4.8 09/22/2018   CO2 24 09/22/2018   GLUCOSE 141 (H) 09/22/2018   BUN 22 09/22/2018   CREATININE 1.16 (H) 09/22/2018   BILITOT 1.2 02/21/2018   ALKPHOS 69 09/24/2016   AST 14 02/21/2018   ALT 12 02/21/2018   PROT 6.5 02/21/2018   ALBUMIN 4.3 09/24/2016   CALCIUM 9.1 09/22/2018   Lab Results  Component Value Date   CHOL 127 02/21/2018   Lab Results  Component Value Date   HDL 47 (L) 02/21/2018   Lab Results  Component Value Date   LDLCALC 58 02/21/2018   Lab Results  Component Value Date   TRIG 135 02/21/2018   Lab Results  Component Value Date   CHOLHDL 2.7 02/21/2018   Lab Results  Component Value Date   HGBA1C 7.0 (A) 01/02/2019      Assessment & Plan:   Problem List Items Addressed This Visit      Cardiovascular and Mediastinum   Essential hypertension    Well controlled. Continue current regimen. Follow up in  4 months.       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        Endocrine   Controlled type 2 diabetes mellitus with microalbuminuria, with long-term current use of insulin (H[facility]) - Primary    A1c up a little bit today at 7.0.  Just encouraged her to really get back on track with increasing her activity.  This should help with her A1c as well as her recent weight gain.      Relevant Orders   POCT glycosylated hemoglobin (Hb A1C) (Completed)     Phlegm -explained that I do not think she needs an antibiotic.  Will try Mucinex over-the-counter.if feeling worse or feeling SOB then please let me know.      No orders of the defined types were placed in this encounter.   Follow-up: Return in about 4 months  (around 05/04/2019) for Diabetes follow-up, Hypertension.    [name], MD

## 2019-01-02 NOTE — Assessment & Plan Note (Signed)
To recheck in October 2021.

## 2019-01-02 NOTE — Patient Instructions (Signed)
Can try some Mucinex for the phlegm.

## 2019-01-02 NOTE — Assessment & Plan Note (Signed)
Well controlled. Continue current regimen. Follow up in  4 months.   

## 2019-01-02 NOTE — Assessment & Plan Note (Signed)
A1c up a little bit today at 7.0.  Just encouraged her to really get back on track with increasing her activity.  This should help with her A1c as well as her recent weight gain.

## 2019-01-04 ENCOUNTER — Other Ambulatory Visit: Payer: Self-pay | Admitting: Family Medicine

## 2019-01-06 ENCOUNTER — Telehealth: Payer: Self-pay

## 2019-01-06 NOTE — Telephone Encounter (Signed)
Albie called and states she still is cough and producing phlegm. She states she has been taking the Mucinex as directed. She is also taking an allergy medication and Flonase. She wanted to know if there is something else she can take. Please advise. Denies fever, chills or sweats.

## 2019-01-06 NOTE — Telephone Encounter (Signed)
Patient advised of recommendations.  

## 2019-01-06 NOTE — Telephone Encounter (Signed)
She can try switching her allergy medicine to Xyzal (OTC) She can also try using a netti pot or saline rinses before her Flonase She should f/u with PCP if she is having signs of infection including fever, chills, change in sputum color, breathing difficulties

## 2019-01-23 ENCOUNTER — Ambulatory Visit: Payer: Medicare Other | Admitting: Family Medicine

## 2019-03-25 ENCOUNTER — Other Ambulatory Visit: Payer: Self-pay | Admitting: Family Medicine

## 2019-04-05 ENCOUNTER — Other Ambulatory Visit: Payer: Self-pay

## 2019-04-05 NOTE — Patient Outreach (Signed)
[facility]) Care Management  04/05/2019  Yolanda Fields Nov 04, 1945 [id number]   Medication Adherence call to Yolanda Fields HIPPA Compliant Voice message left with a call back number. Yolanda Fields is showing past due on Atorvastatin 10 mg under [place].   [facility] Management Direct Dial [phone]  Fax [phone] [email or web address]

## 2019-05-04 ENCOUNTER — Other Ambulatory Visit: Payer: Self-pay

## 2019-05-04 ENCOUNTER — Encounter: Payer: Self-pay | Admitting: Family Medicine

## 2019-05-04 ENCOUNTER — Ambulatory Visit (INDEPENDENT_AMBULATORY_CARE_PROVIDER_SITE_OTHER): Payer: Medicare Other | Admitting: Family Medicine

## 2019-05-04 VITALS — BP 138/60 | HR 103 | Ht 60.0 in | Wt 145.0 lb

## 2019-05-04 DIAGNOSIS — E1129 Type 2 diabetes mellitus with other diabetic kidney complication: Secondary | ICD-10-CM

## 2019-05-04 DIAGNOSIS — Z23 Encounter for immunization: Secondary | ICD-10-CM | POA: Diagnosis not present

## 2019-05-04 DIAGNOSIS — N1832 Chronic kidney disease, stage 3b: Secondary | ICD-10-CM

## 2019-05-04 DIAGNOSIS — I10 Essential (primary) hypertension: Secondary | ICD-10-CM

## 2019-05-04 DIAGNOSIS — E785 Hyperlipidemia, unspecified: Secondary | ICD-10-CM | POA: Diagnosis not present

## 2019-05-04 DIAGNOSIS — E559 Vitamin D deficiency, unspecified: Secondary | ICD-10-CM

## 2019-05-04 DIAGNOSIS — E038 Other specified hypothyroidism: Secondary | ICD-10-CM

## 2019-05-04 DIAGNOSIS — Z8639 Personal history of other endocrine, nutritional and metabolic disease: Secondary | ICD-10-CM

## 2019-05-04 DIAGNOSIS — R0789 Other chest pain: Secondary | ICD-10-CM

## 2019-05-04 DIAGNOSIS — R809 Proteinuria, unspecified: Secondary | ICD-10-CM

## 2019-05-04 DIAGNOSIS — Z794 Long term (current) use of insulin: Secondary | ICD-10-CM

## 2019-05-04 LAB — POCT GLYCOSYLATED HEMOGLOBIN (HGB A1C): Hemoglobin A1C: 6.7 % — AB (ref 4.0–5.6)

## 2019-05-04 LAB — POCT UA - MICROALBUMIN
Creatinine, POC: 10 mg/dL
Microalbumin Ur, POC: 30 mg/L

## 2019-05-04 NOTE — Progress Notes (Signed)
Established Patient Office Visit  Subjective:  Patient ID: Yolanda Fields, female    DOB: 1946-03-31  Age: 73 y.o. MRN: [id number]  CC:  Chief Complaint  Patient presents with  . Diabetes  . Hypertension    HPI Yolanda Fields presents for   Hypertension- Pt denies chest pain, SOB, dizziness, or heart palpitations.  Taking meds as directed w/o problems.  Denies medication side effects.    Diabetes - One hypoglycemic events.  See note below.  No wounds or sores that are not healing well. No increased thirst or urination. Checking glucose at home. Taking medications as prescribed without any side effects.  Hypothyroidism - Taking medication regularly in the AM away from food and vitamins, etc. No recent change to skin, hair, or energy levels.  She does have vitamin D deficiency.  She has been taking 2000 IU daily and we have not rechecked it yet.  She also has a history of B12 deficiency and was actually on injections at one point time she has been taking a supplement and would like to have that rechecked today as well.  He also reports that about a month ago she had an episode of chest pain midsternal that was on and off for about a week.  She felt like it was just a lot of pressure.  She never developed any radiation of pain or diaphoresis.  She thinks it might have been indigestion but she is worried about her heart and would like to have an EKG if possible.  She did have an episode a couple weeks ago though where she is pretty sure that she had a hypoglycemic event while at her sister's house.  She forgot to bring her glucometer with her and so gave her insulin that evening without checking it beforehand and woke up feeling sweaty and weak.  Past Medical History:  Diagnosis Date  . Diabetes mellitus without complication ([place])   . Hyperlipidemia   . Hypertension     Past Surgical History:  Procedure Laterality Date  . CESAREAN SECTION      Family History  Problem Relation  Age of Onset  . Bladder Cancer Brother   . Cancer Sister   . Heart failure Mother 21  . Diabetes Mother   . Heart failure Brother   . Diabetes Brother   . Pancreatic cancer Brother   . Diabetes Sister   . Coronary artery disease Father 21  . Heart attack Father     Social History   Socioeconomic History  . Marital status: Divorced    Spouse name: Not on file  . Number of children: 4  . Years of education: HS  . Highest education level: 12th grade  Occupational History  . Occupation: Retired from [employer]  . Financial resource strain: Not hard at all  . Food insecurity    Worry: Never true    Inability: Never true  . Transportation needs    Medical: No    Non-medical: No  Tobacco Use  . Smoking status: Never Smoker  . Smokeless tobacco: Never Used  Substance and Sexual Activity  . Alcohol use: No    Alcohol/week: 0.0 standard drinks  . Drug use: No  . Sexual activity: Not Currently  Lifestyle  . Physical activity    Days per week: 0 days    Minutes per session: 0 min  . Stress: Not at all  Relationships  . Social Herbalist on  phone: More than three times a week    Gets together: Once a week    Attends religious service: More than 4 times per year    Active member of club or organization: No    Attends meetings of clubs or organizations: Never    Relationship status: Divorced  . Intimate partner violence    Fear of current or ex partner: No    Emotionally abused: No    Physically abused: No    Forced sexual activity: No  Other Topics Concern  . Not on file  Social History Narrative   Patient is retired from [employer].  consumes 2 caffeine drinks daily and rates her diet as good. Will get back into exercising once weather is good again.         Outpatient Medications Prior to Visit  Medication Sig Dispense Refill  . AMBULATORY NON FORMULARY MEDICATION Medication Name: glucometer and strips.  Whatever her insrance will cover.  Dx  E11.9Test daily. 1 Units 0  . amLODipine (NORVASC) 10 MG tablet TAKE ONE TABLET BY MOUTH EVERY DAY 90 tablet 1  . atorvastatin (LIPITOR) 10 MG tablet TAKE ONE TABLET BY MOUTH AT BEDTIME 90 tablet 3  . Cholecalciferol 2000 UNITS CAPS Take by mouth.    Wallace Cullens POWD by Does not apply route.    . cyanocobalamin 1000 MCG tablet Take 1,000 mcg by mouth daily.    Marland Kitchen esomeprazole (NEXIUM) 40 MG capsule TAKE ONE CAPSULE BY MOUTH TWO TIMES A DAY BEFORE A MEAL. 180 capsule 3  . furosemide (LASIX) 40 MG tablet Take 1 tablet (40 mg total) by mouth daily as needed. 90 tablet 1  . JANUMET 50-1000 MG tablet Take 1 tablet by mouth 2 (two) times daily with a meal. 180 tablet 1  . JARDIANCE 10 MG TABS tablet TAKE ONE TABLET BY MOUTH EVERY DAY 90 tablet 2  . LEVEMIR FLEXTOUCH 100 UNIT/ML Pen Inject 30 Units into the skin at bedtime. 45 mL 3  . levothyroxine (SYNTHROID, LEVOTHROID) 100 MCG tablet TAKE ONE TABLET BY MOUTH EVERY DAY 90 tablet 2  . losartan (COZAAR) 100 MG tablet TAKE 1 TABLET BY MOUTH ONCE DAILY 90 tablet 1  . metoprolol succinate (TOPROL-XL) 25 MG 24 hr tablet Take 25 mg by mouth daily.    . Multiple Vitamin (MULTIVITAMIN) tablet Take 1 tablet by mouth daily.    . travoprost, benzalkonium, (TRAVATAN) 0.004 % ophthalmic solution 1 drop at bedtime.    . TRUE METRIX BLOOD GLUCOSE TEST test strip TEST DAILY 100 each 11  . TRULICITY 1.5 0000000 SOPN INJECT 1.5MG  INTO THE SKIN ONCE A WEEK 6 mL 3  . UNIFINE PENTIPS 32G X 4 MM MISC USE AS DIRECTED 100 each 3   No facility-administered medications prior to visit.     Allergies  Allergen Reactions  . Amoxicillin-Pot Clavulanate Swelling  . Tetanus Toxoids Palpitations    ROS Review of Systems    Objective:    Physical Exam  Constitutional: She is oriented to person, place, and time. She appears well-developed and well-nourished.  HENT:  Head: Normocephalic and atraumatic.  Right Ear: External ear normal.  Left Ear: External ear  normal.  Eyes: Conjunctivae are normal.  Neck: Neck supple.  Cardiovascular: Normal rate, regular rhythm and normal heart sounds.  No carotid bruits bilaterally.  It did sound like she has a little bit of radiation from the heart in the right carotid.  Pulmonary/Chest: Effort normal and breath sounds normal.  Musculoskeletal:  General: No edema.  Neurological: She is alert and oriented to person, place, and time.  Skin: Skin is warm and dry. No erythema.  Psychiatric: She has a normal mood and affect. Her behavior is normal.    BP 138/60   Pulse (!) 103   Ht 5' (1.524 m)   Wt 145 lb (65.8 kg)   SpO2 99%   BMI 28.32 kg/m  Wt Readings from Last 3 Encounters:  05/04/19 145 lb (65.8 kg)  01/02/19 153 lb (69.4 kg)  09/22/18 147 lb (66.7 kg)     There are no preventive care reminders to display for this patient.  There are no preventive care reminders to display for this patient.  Lab Results  Component Value Date   TSH 2.32 09/22/2018   No results found for: WBC, HGB, HCT, MCV, PLT Lab Results  Component Value Date   NA 136 09/22/2018   K 4.8 09/22/2018   CO2 24 09/22/2018   GLUCOSE 141 (H) 09/22/2018   BUN 22 09/22/2018   CREATININE 1.16 (H) 09/22/2018   BILITOT 1.2 02/21/2018   ALKPHOS 69 09/24/2016   AST 14 02/21/2018   ALT 12 02/21/2018   PROT 6.5 02/21/2018   ALBUMIN 4.3 09/24/2016   CALCIUM 9.1 09/22/2018   Lab Results  Component Value Date   CHOL 127 02/21/2018   Lab Results  Component Value Date   HDL 47 (L) 02/21/2018   Lab Results  Component Value Date   LDLCALC 58 02/21/2018   Lab Results  Component Value Date   TRIG 135 02/21/2018   Lab Results  Component Value Date   CHOLHDL 2.7 02/21/2018   Lab Results  Component Value Date   HGBA1C 6.7 (A) 05/04/2019      Assessment & Plan:   Problem List Items Addressed This Visit      Cardiovascular and Mediastinum   Essential hypertension - Primary    Well controlled. Continue  current regimen. Follow up in  6 months.       Relevant Orders   POCT glycosylated hemoglobin (Hb A1C) (Completed)   POCT UA - Microalbumin (Completed)   COMPLETE METABOLIC PANEL WITH GFR   Lipid panel     Endocrine   Hypothyroidism    Due to recheck TSH.  She is doing well on her current regimen and is asymptomatic.      Relevant Orders   TSH   Controlled type 2 diabetes mellitus with microalbuminuria, with long-term current use of insulin ([place])    Well controlled. Continue current regimen. Follow up in  4 mo.       Relevant Orders   POCT glycosylated hemoglobin (Hb A1C) (Completed)   POCT UA - Microalbumin (Completed)   COMPLETE METABOLIC PANEL WITH GFR   Lipid panel     Genitourinary   Chronic kidney disease (CKD) stage G3b/A1, moderately decreased glomerular filtration rate (GFR) between 30-44 mL/min/1.73 square meter and albuminuria creatinine ratio less than 30 mg/g   Relevant Orders   POCT glycosylated hemoglobin (Hb A1C) (Completed)   POCT UA - Microalbumin (Completed)   COMPLETE METABOLIC PANEL WITH GFR   Lipid panel     Other   Vitamin D deficiency    Continue supplementation.  Due to recheck levels.      Relevant Orders   Vitamin D (25 hydroxy)   Hyperlipidemia    Continue atorvastatin.  Due to recheck lipid levels.      Relevant Orders   POCT glycosylated hemoglobin (Hb A1C) (  Completed)   POCT UA - Microalbumin (Completed)   COMPLETE METABOLIC PANEL WITH GFR   Lipid panel    Other Visit Diagnoses    Need for immunization against influenza       Relevant Orders   Flu Vaccine QUAD High Dose(Fluad) (Completed)   History of non anemic vitamin B12 deficiency       Relevant Orders   B12   Atypical chest pain       Relevant Orders   EKG 12-Lead     Atypical chest pain-sounds like it is probably more GERD related but we will go ahead and do an EKG today and compared to previous.  Ekg shows rate of 97 bpm, nsr, no acute changes.  No worrisome  findings.  It has not happened again in the last month we will certainly keep an eye on this.  She is at risk for heart disease because she does have diabetes but has never had problems before.  If it occurs again encouraged her to get back into the office sooner rather than later.  No orders of the defined types were placed in this encounter.   Follow-up: Return in about 4 months (around 09/04/2019) for Diabetes follow-up.    [name], MD

## 2019-05-04 NOTE — Assessment & Plan Note (Signed)
Well controlled. Continue current regimen. Follow up in  6 months.  

## 2019-05-04 NOTE — Assessment & Plan Note (Signed)
Due to recheck TSH.  She is doing well on her current regimen and is asymptomatic.

## 2019-05-04 NOTE — Assessment & Plan Note (Signed)
Well controlled. Continue current regimen. Follow up in  4 mo 

## 2019-05-04 NOTE — Assessment & Plan Note (Signed)
Continue supplementation.  Due to recheck levels.

## 2019-05-04 NOTE — Assessment & Plan Note (Signed)
Continue atorvastatin.  Due to recheck lipid levels.

## 2019-05-05 LAB — COMPLETE METABOLIC PANEL WITH GFR
AG Ratio: 2.1 (calc) (ref 1.0–2.5)
ALT: 10 U/L (ref 6–29)
AST: 16 U/L (ref 10–35)
Albumin: 4.4 g/dL (ref 3.6–5.1)
Alkaline phosphatase (APISO): 64 U/L (ref 37–153)
BUN/Creatinine Ratio: 22 (calc) (ref 6–22)
BUN: 24 mg/dL (ref 7–25)
CO2: 27 mmol/L (ref 20–32)
Calcium: 9.8 mg/dL (ref 8.6–10.4)
Chloride: 103 mmol/L (ref 98–110)
Creat: 1.11 mg/dL — ABNORMAL HIGH (ref 0.60–0.93)
GFR, Est African American: 56 mL/min/{1.73_m2} — ABNORMAL LOW (ref >=60)
GFR, Est Non African American: 49 mL/min/{1.73_m2} — ABNORMAL LOW (ref >=60)
Globulin: 2.1 g/dL (calc) (ref 1.9–3.7)
Glucose, Bld: 86 mg/dL (ref 65–99)
Potassium: 4.6 mmol/L (ref 3.5–5.3)
Sodium: 139 mmol/L (ref 135–146)
Total Bilirubin: 1.5 mg/dL — ABNORMAL HIGH (ref 0.2–1.2)
Total Protein: 6.5 g/dL (ref 6.1–8.1)

## 2019-05-05 LAB — LIPID PANEL
Cholesterol: 109 mg/dL (ref <200)
HDL: 42 mg/dL — ABNORMAL LOW (ref >=50)
LDL Cholesterol (Calc): 47 mg/dL (calc)
Non-HDL Cholesterol (Calc): 67 mg/dL (calc) (ref <130)
Total CHOL/HDL Ratio: 2.6 (calc) (ref <5.0)
Triglycerides: 122 mg/dL (ref <150)

## 2019-05-05 LAB — VITAMIN B12: Vitamin B-12: 812 pg/mL (ref 200–1100)

## 2019-05-05 LAB — TSH: TSH: 2.6 mIU/L (ref 0.40–4.50)

## 2019-05-05 LAB — VITAMIN D 25 HYDROXY (VIT D DEFICIENCY, FRACTURES): Vit D, 25-Hydroxy: 48 ng/mL (ref 30–100)

## 2019-05-22 ENCOUNTER — Other Ambulatory Visit: Payer: Self-pay | Admitting: Family Medicine

## 2019-06-02 ENCOUNTER — Other Ambulatory Visit: Payer: Self-pay | Admitting: Family Medicine

## 2019-06-06 ENCOUNTER — Other Ambulatory Visit: Payer: Self-pay

## 2019-06-06 NOTE — Patient Outreach (Signed)
[facility]) Care Management  06/06/2019  Yolanda Fields [birth year] [id number]   Medication Adherence call to Mrs. Yolanda Fields [facility] Ident[employer] Verify spoke with patient she is past due on Losartan 100 mg,patient explain she takes her medications on a regular basis,there was a few times where her blood pressure was too low and did not take it,patient will check with her pharmacy but explain she has enough at this time.[name] is showing past due under [place].   [facility] Management Direct Dial [phone]  Fax [phone] [email or web address]

## 2019-06-23 ENCOUNTER — Other Ambulatory Visit: Payer: Self-pay | Admitting: Family Medicine

## 2019-07-08 ENCOUNTER — Other Ambulatory Visit: Payer: Self-pay | Admitting: Family Medicine

## 2019-07-26 DIAGNOSIS — Z1231 Encounter for screening mammogram for malignant neoplasm of breast: Secondary | ICD-10-CM | POA: Diagnosis not present

## 2019-07-26 LAB — HM MAMMOGRAPHY

## 2019-07-28 ENCOUNTER — Encounter: Payer: Self-pay | Admitting: Family Medicine

## 2019-08-27 ENCOUNTER — Other Ambulatory Visit: Payer: Self-pay | Admitting: Family Medicine

## 2019-08-29 NOTE — Progress Notes (Signed)
Subjective:   Yolanda Fields is a 74 y.o. female who presents for Medicare Annual (Subsequent) preventive examination.  Review of Systems:  No ROS.  Medicare Wellness Virtual Visit.  Visual/audio telehealth visit, UTA vital signs.   See social history for additional risk factors.    Cardiac Risk Factors include: advanced age (>14men, >52 women);diabetes mellitus;hypertension Sleep patterns: Getting 6 hours of sleep a night. Wakes up 1 time to void during the night. Wakes up and feels rested and ready for the fday. Home Safety/Smoke Alarms: Feels safe in home. Smoke alarms in place.  Living environment; Lives alone in a 2 story condo. Stairs have handrails on them. Shower is a step over tub combo and grab bars in place as well as a bench. Seat Belt Safety/Bike Helmet: Wears seat belt.   Female:   Pap- Aged out      Mammo-   UTD  Dexa scan- UTD       CCS- UTD     Objective:     Vitals: BP (!) 119/51   Pulse 68   Ht 5' (1.524 m)   Wt 146 lb (66.2 kg)   SpO2 97%   BMI 28.51 kg/m   Body mass index is 28.51 kg/m.  Advanced Directives 09/11/2019 09/07/2018 09/24/2016  Does Patient Have a Medical Advance Directive? No No No  Would patient like information on creating a medical advance directive? Yes (MAU/Ambulatory/Procedural Areas - Information given) No - Patient declined No - Patient declined    Tobacco Social History   Tobacco Use  Smoking Status Never Smoker  Smokeless Tobacco Never Used     Counseling given: No   Clinical Intake:  Pre-visit preparation completed: Yes  Pain : No/denies pain Pain Score: 0-No pain     Nutritional Risks: None Diabetes: Yes CBG done?: No(FBS at home this am 90) Did pt. bring in CBG monitor from home?: No  How often do you need to have someone help you when you read instructions, pamphlets, or other written materials from your doctor or pharmacy?: 1 - Never What is the last grade level you completed in school?:  12  Interpreter Needed?: No  Information entered by :: Orlie Dakin, LPN  Past Medical History:  Diagnosis Date  . Diabetes mellitus without complication ([place])   . Hyperlipidemia   . Hypertension    Past Surgical History:  Procedure Laterality Date  . CESAREAN SECTION     Family History  Problem Relation Age of Onset  . Bladder Cancer Brother   . Cancer Sister   . Heart failure Mother 41  . Diabetes Mother   . Heart failure Brother   . Diabetes Brother   . Pancreatic cancer Brother   . Diabetes Sister   . Coronary artery disease Father 20  . Heart attack Father    Social History   Socioeconomic History  . Marital status: Divorced    Spouse name: Not on file  . Number of children: 4  . Years of education: HS  . Highest education level: 12th grade  Occupational History  . Occupation: Retired from [employer]   Tobacco Use  . Smoking status: Never Smoker  . Smokeless tobacco: Never Used  Substance and Sexual Activity  . Alcohol use: No    Alcohol/week: 0.0 standard drinks  . Drug use: No  . Sexual activity: Not Currently  Other Topics Concern  . Not on file  Social History Narrative   Patient is retired from [employer].  consumes  2 caffeine drinks daily and rates her diet as good. Will get back into exercising once weather is good again.        Social Determinants of Health   Financial Resource Strain:   . Difficulty of Paying Living Expenses: Not on file  Food Insecurity:   . Worried About Charity fundraiser in the Last Year: Not on file  . Ran Out of Food in the Last Year: Not on file  Transportation Needs:   . Lack of Transportation (Medical): Not on file  . Lack of Transportation (Non-Medical): Not on file  Physical Activity:   . Days of Exercise per Week: Not on file  . Minutes of Exercise per Session: Not on file  Stress:   . Feeling of Stress : Not on file  Social Connections:   . Frequency of Communication with Friends and Family: Not on file  . Frequency  of Social Gatherings with Friends and Family: Not on file  . Attends Religious Services: Not on file  . Active Member of Clubs or Organizations: Not on file  . Attends Archivist Meetings: Not on file  . Marital Status: Not on file    Outpatient Encounter Medications as of 09/11/2019  Medication Sig  . AMBULATORY NON FORMULARY MEDICATION Medication Name: glucometer and strips.  Whatever her insrance will cover.  Dx E11.9Test daily.  Marland Kitchen amLODipine (NORVASC) 10 MG tablet TAKE ONE TABLET BY MOUTH EVERY DAY  . atorvastatin (LIPITOR) 10 MG tablet TAKE ONE TABLET BY MOUTH AT BEDTIME  . Cholecalciferol 2000 UNITS CAPS Take by mouth.  Wallace Cullens POWD by Does not apply route.  . cyanocobalamin 1000 MCG tablet Take 1,000 mcg by mouth daily.  Marland Kitchen esomeprazole (NEXIUM) 40 MG capsule TAKE ONE CAPSULE BY MOUTH TWO TIMES A DAY BEFORE A MEAL.  . furosemide (LASIX) 40 MG tablet Take 1 tablet (40 mg total) by mouth daily as needed.  Marland Kitchen JANUMET 50-1000 MG tablet Take 1 tablet by mouth 2 (two) times daily with a meal.  . JARDIANCE 10 MG TABS tablet TAKE ONE TABLET BY MOUTH EVERY DAY  . LEVEMIR FLEXTOUCH 100 UNIT/ML Pen Inject 30 Units into the skin at bedtime.  Marland Kitchen levothyroxine (SYNTHROID) 100 MCG tablet TAKE ONE TABLET BY MOUTH EVERY DAY  . losartan (COZAAR) 100 MG tablet TAKE 1 TABLET BY MOUTH ONCE DAILY  . metoprolol succinate (TOPROL-XL) 25 MG 24 hr tablet Take 25 mg by mouth daily.  . Multiple Vitamin (MULTIVITAMIN) tablet Take 1 tablet by mouth daily.  . travoprost, benzalkonium, (TRAVATAN) 0.004 % ophthalmic solution 1 drop at bedtime.  . TRUE METRIX BLOOD GLUCOSE TEST test strip TEST DAILY  . TRULICITY 1.5 0000000 SOPN INJECT 1.5MG  INTO THE SKIN ONCE A WEEK  . UNIFINE PENTIPS 32G X 4 MM MISC USE AS DIRECTED   No facility-administered encounter medications on file as of 09/11/2019.    Activities of Daily Living In your present state of health, do you have any difficulty performing the  following activities: 09/11/2019  Hearing? Y  Comment wears hearing aids bilaterally  Vision? N  Difficulty concentrating or making decisions? N  Walking or climbing stairs? N  Dressing or bathing? N  Doing errands, shopping? N  Preparing Food and eating ? N  Using the Toilet? N  In the past six months, have you accidently leaked urine? N  Do you have problems with loss of bowel control? N  Managing your Medications? N  Managing your Finances? N  Housekeeping or managing your Housekeeping? N  Some recent data might be hidden    Patient Care Team: [name], MD as PCP - General (Family Medicine)    Assessment:   This is a routine wellness examination for [place].Physical assessment deferred to PCP.   Exercise Activities and Dietary recommendations Current Exercise Habits: Home exercise routine, Type of exercise: walking, Time (Minutes): 25, Frequency (Times/Week): 4, Weekly Exercise (Minutes/Week): 100, Intensity: Mild, Exercise limited by: None identified Diet Eats a healthy diet of vegetables, fruits and proteins. Breakfast: Oatmeal, egg and toast, bacon, pancake with blueberries Lunch: Vegetables and salad, meat Dinner: skips because she does not like to eat after 4:00 pm.   Drinks 3-4 bottles of water  A day    Goals    . Exercise 3x per week (30 min per time)     Would like to increase her exercise to more times a week. Increase water intake to 64 ounces a day    . Patient Stated     Would like to be off the insulin in 2020. Start back walking everyday for at least 30 minutes a day    . Weight (lb) < 200 lb (90.7 kg) (pt-stated)       Fall Risk Fall Risk  09/11/2019 05/04/2019 09/07/2018 07/19/2017 12/25/2016  Falls in the past year? 0 0 0 No No  Number falls in past yr: - 0 - - -  Injury with Fall? - 0 - - -  Risk for fall due to : No Fall Risks - - - -  Follow up Falls prevention discussed - Falls prevention discussed - -   Is the patient's home free of  loose throw rugs in walkways, pet beds, electrical cords, etc?   yes      Grab bars in the bathroom? no      Handrails on the stairs?   yes      Adequate lighting?   yes   Depression Screen PHQ 2/9 Scores 09/11/2019 05/04/2019 09/07/2018 02/21/2018  PHQ - 2 Score 0 0 0 0     Cognitive Function     6CIT Screen 09/11/2019 09/07/2018 09/24/2016  What Year? 0 points 0 points 0 points  What month? 0 points 0 points 0 points  What time? 0 points 0 points 0 points  Count back from 20 0 points 0 points 0 points  Months in reverse 0 points 0 points 0 points  Repeat phrase 0 points 2 points 0 points  Total Score 0 2 0    Immunization History  Administered Date(s) Administered  . Fluad Quad(high Dose 65+) 05/04/2019  . Influenza, High Dose Seasonal PF 04/13/2017, 05/24/2018  . Influenza, Seasonal, Injecte, Preservative Fre 06/10/2011, 04/21/2012  . Influenza,inj,Quad PF,6+ Mos 04/20/2013, 03/27/2016  . Influenza-Unspecified 04/29/2015  . Pneumococcal Conjugate-13 07/23/2014, 12/26/2015, 04/20/2016  . Pneumococcal Polysaccharide-23 11/28/2009  . Zoster 04/25/2012    Screening Tests Health Maintenance  Topic Date Due  . OPHTHALMOLOGY EXAM  09/30/2019 (Originally 09/30/2018)  . FOOT EXAM  09/22/2019  . HEMOGLOBIN A1C  11/02/2019  . COLONOSCOPY  05/08/2022  . DEXA SCAN  10/07/2026  . INFLUENZA VACCINE  Completed  . Hepatitis C Screening  Completed  . PNA vac Low Risk Adult  Completed      Plan:    Please schedule your next medicare wellness visit with me in 1 yr.  Ms. Duro , Thank you for taking time to come for your Medicare Wellness Visit. I appreciate your ongoing  commitment to your health goals. Please review the following plan we discussed and let me know if I can assist you in the future.  Continue doing brain stimulating activities (puzzles, reading, adult coloring books, staying active) to keep memory sharp.    These are the goals we discussed: Goals    . Exercise 3x per  week (30 min per time)     Would like to increase her exercise to more times a week. Increase water intake to 64 ounces a day    . Patient Stated     Would like to be off the insulin in 2020. Start back walking everyday for at least 30 minutes a day    . Weight (lb) < 200 lb (90.7 kg) (pt-stated)       This is a list of the screening recommended for you and due dates:  Health Maintenance  Topic Date Due  . Eye exam for diabetics  09/30/2019*  . Complete foot exam   09/22/2019  . Hemoglobin A1C  11/02/2019  . Colon Cancer Screening  05/08/2022  . DEXA scan (bone density measurement)  10/07/2026  . Flu Shot  Completed  .  Hepatitis C: One time screening is recommended by Center for Disease Control  (CDC) for  adults born from [birth year] through 1967.   Completed  . Pneumonia vaccines  Completed  *Topic was postponed. The date shown is not the original due date.      I have personally reviewed and noted the following in the patient's chart:   . Medical and social history . Use of alcohol, tobacco or illicit drugs  . Current medications and supplements . Functional ability and status . Nutritional status . Physical activity . Advanced directives . List of other physicians . Hospitalizations, surgeries, and ER visits in previous 12 months . Vitals . Screenings to include cognitive, depression, and falls . Referrals and appointments  In addition, I have reviewed and discussed with patient certain preventive protocols, quality metrics, and best practice recommendations. A written personalized care plan for preventive services as well as general preventive health recommendations were provided to patient.     Joanne Chars, LPN  [phone]

## 2019-09-05 ENCOUNTER — Ambulatory Visit: Payer: Medicare Other | Admitting: Family Medicine

## 2019-09-11 ENCOUNTER — Other Ambulatory Visit: Payer: Self-pay

## 2019-09-11 ENCOUNTER — Ambulatory Visit (INDEPENDENT_AMBULATORY_CARE_PROVIDER_SITE_OTHER): Payer: Medicare Other | Admitting: *Deleted

## 2019-09-11 VITALS — BP 119/51 | HR 68 | Ht 60.0 in | Wt 146.0 lb

## 2019-09-11 DIAGNOSIS — Z0000 Encounter for general adult medical examination without abnormal findings: Secondary | ICD-10-CM

## 2019-09-11 NOTE — Patient Instructions (Addendum)
Please schedule your next medicare wellness visit with me in 1 yr.  Yolanda Fields , Thank you for taking time to come for your Medicare Wellness Visit. I appreciate your ongoing commitment to your health goals. Please review the following plan we discussed and let me know if I can assist you in the future.  Continue doing brain stimulating activities (puzzles, reading, adult coloring books, staying active) to keep memory sharp.   These are the goals we discussed: Goals    . Exercise 3x per week (30 min per time)     Would like to increase her exercise to more times a week. Increase water intake to 64 ounces a day    . Patient Stated     Would like to be off the insulin in 2020. Start back walking everyday for at least 30 minutes a day    . Weight (lb) < 200 lb (90.7 kg) (pt-stated)

## 2019-09-19 ENCOUNTER — Other Ambulatory Visit: Payer: Self-pay

## 2019-09-19 ENCOUNTER — Encounter: Payer: Self-pay | Admitting: Family Medicine

## 2019-09-19 ENCOUNTER — Ambulatory Visit (INDEPENDENT_AMBULATORY_CARE_PROVIDER_SITE_OTHER): Payer: Medicare Other | Admitting: Family Medicine

## 2019-09-19 VITALS — BP 135/51 | HR 89 | Ht 60.0 in | Wt 147.0 lb

## 2019-09-19 DIAGNOSIS — R809 Proteinuria, unspecified: Secondary | ICD-10-CM | POA: Diagnosis not present

## 2019-09-19 DIAGNOSIS — F4321 Adjustment disorder with depressed mood: Secondary | ICD-10-CM

## 2019-09-19 DIAGNOSIS — I10 Essential (primary) hypertension: Secondary | ICD-10-CM

## 2019-09-19 DIAGNOSIS — Z794 Long term (current) use of insulin: Secondary | ICD-10-CM

## 2019-09-19 DIAGNOSIS — E1129 Type 2 diabetes mellitus with other diabetic kidney complication: Secondary | ICD-10-CM

## 2019-09-19 DIAGNOSIS — N1832 Chronic kidney disease, stage 3b: Secondary | ICD-10-CM | POA: Diagnosis not present

## 2019-09-19 LAB — POCT UA - MICROALBUMIN
Creatinine, POC: 10 mg/dL
Microalbumin Ur, POC: 30 mg/L

## 2019-09-19 LAB — POCT GLYCOSYLATED HEMOGLOBIN (HGB A1C): Hemoglobin A1C: 7.1 % — AB (ref 4.0–5.6)

## 2019-09-19 NOTE — Assessment & Plan Note (Signed)
Well controlled. Continue current regimen. Follow up in  4 months.   

## 2019-09-19 NOTE — Assessment & Plan Note (Addendum)
A1c 7.1 today which is up from previous of 6.7.  She really does not feel like there have been any major changes in her diet or exercise it sounds like she has been having problems getting her insulin inconsistently because of the needles.  Encouraged her to check with her pharmacy and also make sure that she is actually screwing them on instead of trying to push them on and see if that seems to work better and if she is able to get the medication in more consistently.  Currently on Trulicity, Levemir and Jardiance.  She is on an ARB for proteinuria.  So on a statin.  Follow-up in 3 months.

## 2019-09-19 NOTE — Progress Notes (Signed)
Established Patient Office Visit  Subjective:  Patient ID: Yolanda Fields, female    DOB: 08-Aug-1945  Age: 74 y.o. MRN: [id number]  CC:  Chief Complaint  Patient presents with  . Diabetes  . Hypertension    HPI Yolanda Fields presents for   Diabetes - no hypoglycemic events. No wounds or sores that are not healing well. No increased thirst or urination. Checking glucose at home. Taking medications as prescribed without any side effects.  She reports that for the last 2 months she has had difficulty injecting her insulin she feels like a lot of it is looking out and getting onto her hands and she can smell it.  She says sometimes that the button will press down to about 27 units and then will just stop and she cannot get it to go further.  So that she will have to put another needle on to try to finish the 30 units out.  So she feels like she is not getting her consistent 30 units of Lantus.  The more we talked about the issue she reported that the initial needles that she had worked the best.  When I reviewed with her how to make sure that the needle is placed properly it sounds like she was pushing it on instead of screwing them on.   Hypertension- Pt denies chest pain, SOB, dizziness, or heart palpitations.  Taking meds as directed w/o problems.  Denies medication side effects.   Follow-up CKD 3-no recent changes to urination.  She did have a few questions about it and she did want the protein in her urine rechecked today.  Unfortunately her brother just passed away suddenly after surgery.  He evidently fell and had a fracture that required surgical repair and then passed away about a day later after discharge home from the hospital.  She has been tearful and really grieving.   Past Medical History:  Diagnosis Date  . Diabetes mellitus without complication ([place])   . Hyperlipidemia   . Hypertension     Past Surgical History:  Procedure Laterality Date  . CESAREAN SECTION       Family History  Problem Relation Age of Onset  . Bladder Cancer Brother   . Cancer Sister   . Heart failure Mother 75  . Diabetes Mother   . Heart failure Brother   . Diabetes Brother   . Pancreatic cancer Brother   . Diabetes Sister   . Coronary artery disease Father 1  . Heart attack Father     Social History   Socioeconomic History  . Marital status: Divorced    Spouse name: Not on file  . Number of children: 4  . Years of education: HS  . Highest education level: 12th grade  Occupational History  . Occupation: Retired from [employer]   Tobacco Use  . Smoking status: Never Smoker  . Smokeless tobacco: Never Used  Substance and Sexual Activity  . Alcohol use: No    Alcohol/week: 0.0 standard drinks  . Drug use: No  . Sexual activity: Not Currently  Other Topics Concern  . Not on file  Social History Narrative   Patient is retired from [employer].  consumes 2 caffeine drinks daily and rates her diet as good. Will get back into exercising once weather is good again.        Social Determinants of Health   Financial Resource Strain:   . Difficulty of Paying Living Expenses: Not on file  Food Insecurity:   .  Worried About Charity fundraiser in the Last Year: Not on file  . Ran Out of Food in the Last Year: Not on file  Transportation Needs:   . Lack of Transportation (Medical): Not on file  . Lack of Transportation (Non-Medical): Not on file  Physical Activity:   . Days of Exercise per Week: Not on file  . Minutes of Exercise per Session: Not on file  Stress:   . Feeling of Stress : Not on file  Social Connections:   . Frequency of Communication with Friends and Family: Not on file  . Frequency of Social Gatherings with Friends and Family: Not on file  . Attends Religious Services: Not on file  . Active Member of Clubs or Organizations: Not on file  . Attends Archivist Meetings: Not on file  . Marital Status: Not on file  Intimate Partner Violence:   .  Fear of Current or Ex-Partner: Not on file  . Emotionally Abused: Not on file  . Physically Abused: Not on file  . Sexually Abused: Not on file    Outpatient Medications Prior to Visit  Medication Sig Dispense Refill  . AMBULATORY NON FORMULARY MEDICATION Medication Name: glucometer and strips.  Whatever her insrance will cover.  Dx E11.9Test daily. 1 Units 0  . amLODipine (NORVASC) 10 MG tablet TAKE ONE TABLET BY MOUTH EVERY DAY 90 tablet 1  . atorvastatin (LIPITOR) 10 MG tablet TAKE ONE TABLET BY MOUTH AT BEDTIME 90 tablet 3  . Cholecalciferol 2000 UNITS CAPS Take by mouth.    Wallace Cullens POWD by Does not apply route.    . cyanocobalamin 1000 MCG tablet Take 1,000 mcg by mouth daily.    Marland Kitchen esomeprazole (NEXIUM) 40 MG capsule TAKE ONE CAPSULE BY MOUTH TWO TIMES A DAY BEFORE A MEAL. 180 capsule 3  . furosemide (LASIX) 40 MG tablet Take 1 tablet (40 mg total) by mouth daily as needed. 90 tablet 1  . JANUMET 50-1000 MG tablet Take 1 tablet by mouth 2 (two) times daily with a meal. 180 tablet 1  . JARDIANCE 10 MG TABS tablet TAKE ONE TABLET BY MOUTH EVERY DAY 90 tablet 2  . LEVEMIR FLEXTOUCH 100 UNIT/ML Pen Inject 30 Units into the skin at bedtime. 45 mL 3  . levothyroxine (SYNTHROID) 100 MCG tablet TAKE ONE TABLET BY MOUTH EVERY DAY 90 tablet 2  . losartan (COZAAR) 100 MG tablet TAKE 1 TABLET BY MOUTH ONCE DAILY 90 tablet 1  . metoprolol succinate (TOPROL-XL) 25 MG 24 hr tablet Take 25 mg by mouth daily.    . Multiple Vitamin (MULTIVITAMIN) tablet Take 1 tablet by mouth daily.    . TRUE METRIX BLOOD GLUCOSE TEST test strip TEST DAILY 100 each 11  . TRULICITY 1.5 0000000 SOPN INJECT 1.5MG  INTO THE SKIN ONCE A WEEK 6 mL 3  . UNIFINE PENTIPS 32G X 4 MM MISC USE AS DIRECTED 100 each 3  . travoprost, benzalkonium, (TRAVATAN) 0.004 % ophthalmic solution 1 drop at bedtime.     No facility-administered medications prior to visit.    Allergies  Allergen Reactions  . Amoxicillin-Pot  Clavulanate Swelling  . Tetanus Toxoids Palpitations    ROS Review of Systems    Objective:    Physical Exam  BP (!) 135/51   Pulse 89   Ht 5' (1.524 m)   Wt 147 lb (66.7 kg)   SpO2 99%   BMI 28.71 kg/m  Wt Readings from Last 3 Encounters:  09/19/19 147 lb (66.7 kg)  09/11/19 146 lb (66.2 kg)  05/04/19 145 lb (65.8 kg)     There are no preventive care reminders to display for this patient.  There are no preventive care reminders to display for this patient.  Lab Results  Component Value Date   TSH 2.60 05/04/2019   No results found for: WBC, HGB, HCT, MCV, PLT Lab Results  Component Value Date   NA 139 05/04/2019   K 4.6 05/04/2019   CO2 27 05/04/2019   GLUCOSE 86 05/04/2019   BUN 24 05/04/2019   CREATININE 1.11 (H) 05/04/2019   BILITOT 1.5 (H) 05/04/2019   ALKPHOS 69 09/24/2016   AST 16 05/04/2019   ALT 10 05/04/2019   PROT 6.5 05/04/2019   ALBUMIN 4.3 09/24/2016   CALCIUM 9.8 05/04/2019   Lab Results  Component Value Date   CHOL 109 05/04/2019   Lab Results  Component Value Date   HDL 42 (L) 05/04/2019   Lab Results  Component Value Date   LDLCALC 47 05/04/2019   Lab Results  Component Value Date   TRIG 122 05/04/2019   Lab Results  Component Value Date   CHOLHDL 2.6 05/04/2019   Lab Results  Component Value Date   HGBA1C 7.1 (A) 09/19/2019      Assessment & Plan:   Problem List Items Addressed This Visit      Cardiovascular and Mediastinum   Essential hypertension    Well controlled. Continue current regimen. Follow up in  4 months.         Endocrine   Controlled type 2 diabetes mellitus with microalbuminuria, with long-term current use of insulin (HCC) - Primary    A1c 7.1 today which is up from previous of 6.7.  She really does not feel like there have been any major changes in her diet or exercise it sounds like she has been having problems getting her insulin inconsistently because of the needles.  Encouraged her to check  with her pharmacy and also make sure that she is actually screwing them on instead of trying to push them on and see if that seems to work better and if she is able to get the medication in more consistently.  Currently on Trulicity, Levemir and Jardiance.  She is on an ARB for proteinuria.  So on a statin.  Follow-up in 3 months.      Relevant Orders   POCT glycosylated hemoglobin (Hb A1C) (Completed)   POCT UA - Microalbumin (Completed)     Genitourinary   Chronic kidney disease (CKD) stage G3b/A1, moderately decreased glomerular filtration rate (GFR) between 30-44 mL/min/1.73 square meter and albuminuria creatinine ratio less than 30 mg/g   Relevant Orders   POCT UA - Microalbumin (Completed)   BASIC METABOLIC PANEL WITH GFR    Other Visit Diagnoses    Grief         Grief-discussed options including therapy/counseling.  She said she will think about it and let me know.  No orders of the defined types were placed in this encounter.   Follow-up: Return in about 3 months (around 12/20/2019) for Diabetes follow-up.    [name], MD

## 2019-09-21 ENCOUNTER — Other Ambulatory Visit: Payer: Self-pay | Admitting: Family Medicine

## 2019-10-23 LAB — BASIC METABOLIC PANEL WITH GFR
BUN/Creatinine Ratio: 30 (calc) — ABNORMAL HIGH (ref 6–22)
BUN: 29 mg/dL — ABNORMAL HIGH (ref 7–25)
CO2: 25 mmol/L (ref 20–32)
Calcium: 9.5 mg/dL (ref 8.6–10.4)
Chloride: 105 mmol/L (ref 98–110)
Creat: 0.98 mg/dL — ABNORMAL HIGH (ref 0.60–0.93)
GFR, Est African American: 65 mL/min/{1.73_m2} (ref >=60)
GFR, Est Non African American: 56 mL/min/{1.73_m2} — ABNORMAL LOW (ref >=60)
Glucose, Bld: 130 mg/dL — ABNORMAL HIGH (ref 65–99)
Potassium: 4.2 mmol/L (ref 3.5–5.3)
Sodium: 139 mmol/L (ref 135–146)

## 2019-12-05 LAB — HM DIABETES EYE EXAM

## 2019-12-20 ENCOUNTER — Ambulatory Visit: Payer: Medicare Other | Admitting: Family Medicine

## 2019-12-24 ENCOUNTER — Other Ambulatory Visit: Payer: Self-pay | Admitting: Family Medicine

## 2019-12-28 ENCOUNTER — Ambulatory Visit: Payer: Medicare Other | Admitting: Family Medicine

## 2019-12-29 ENCOUNTER — Ambulatory Visit (INDEPENDENT_AMBULATORY_CARE_PROVIDER_SITE_OTHER): Payer: Medicare Other | Admitting: Family Medicine

## 2019-12-29 ENCOUNTER — Encounter: Payer: Self-pay | Admitting: Family Medicine

## 2019-12-29 VITALS — BP 132/67 | HR 83 | Ht 60.0 in | Wt 150.0 lb

## 2019-12-29 DIAGNOSIS — Z794 Long term (current) use of insulin: Secondary | ICD-10-CM | POA: Diagnosis not present

## 2019-12-29 DIAGNOSIS — I10 Essential (primary) hypertension: Secondary | ICD-10-CM | POA: Diagnosis not present

## 2019-12-29 DIAGNOSIS — N1832 Chronic kidney disease, stage 3b: Secondary | ICD-10-CM

## 2019-12-29 DIAGNOSIS — E538 Deficiency of other specified B group vitamins: Secondary | ICD-10-CM | POA: Diagnosis not present

## 2019-12-29 DIAGNOSIS — R809 Proteinuria, unspecified: Secondary | ICD-10-CM | POA: Diagnosis not present

## 2019-12-29 DIAGNOSIS — E1129 Type 2 diabetes mellitus with other diabetic kidney complication: Secondary | ICD-10-CM | POA: Diagnosis not present

## 2019-12-29 LAB — POCT GLYCOSYLATED HEMOGLOBIN (HGB A1C): Hemoglobin A1C: 7.1 % — AB (ref 4.0–5.6)

## 2019-12-29 MED ORDER — LANCETS MISC: 11 refills | Status: DC

## 2019-12-29 NOTE — Assessment & Plan Note (Signed)
Currently taking a gummy supplement.  Plan to recheck B12 at next office visit.

## 2019-12-29 NOTE — Assessment & Plan Note (Signed)
A1C looks great today at 7.1  Continue current regimen.  Is really made some great changes including not eating late at night, cutting back on sweets, she has been walking and exercising more and trying to drink water more so even though her A1c did not change much and just really encouraged her to keep up these great practices I think it is more healthy.

## 2019-12-29 NOTE — Progress Notes (Signed)
Established Patient Office Visit  Subjective:  Patient ID: Yolanda Fields, female    DOB: 1946-02-28  Age: 74 y.o. MRN: [id number]  CC:  Chief Complaint  Patient presents with  . Diabetes  . Hypertension    HPI Yolanda Fields presents for   Diabetes - no hypoglycemic events. No wounds or sores that are not healing well. No increased thirst or urination. Checking glucose at home. Taking medications as prescribed without any side effects. Due for foot exam.  Based on refills I don't think she is taking her Jardiance. She would have runou out 7 months ago. Removed from medication list.  She is really made some great lifestyle changes.  She is doing much better with injecting the insulin she was having a lot of problems with actually getting it into her the skin and so was able to switch needles and has been much more successful. On an ARB.     Hypertension- Pt denies chest pain, SOB, dizziness, or heart palpitations.  Taking meds as directed w/o problems.  Denies medication side effects.    He is taking a gummy B12 vitamin and just wants to keep an eye on that she also takes a vitamin D3 supplement.  She let me know that she is moving to a 1 floor apartment.  But she says she will have plenty of places to walk and exercise which she is excited about.  She was hoping to move a little closer to her daughter in [place] but says that she could not find anything there.   Past Medical History:  Diagnosis Date  . Diabetes mellitus without complication ([place])   . Hyperlipidemia   . Hypertension     Past Surgical History:  Procedure Laterality Date  . CESAREAN SECTION      Family History  Problem Relation Age of Onset  . Bladder Cancer Brother   . Cancer Sister   . Heart failure Mother 75  . Diabetes Mother   . Heart failure Brother   . Diabetes Brother   . Pancreatic cancer Brother   . Diabetes Sister   . Coronary artery disease Father 74  . Heart attack Father     Social  History   Socioeconomic History  . Marital status: Divorced    Spouse name: Not on file  . Number of children: 4  . Years of education: HS  . Highest education level: 12th grade  Occupational History  . Occupation: Retired from [employer]   Tobacco Use  . Smoking status: Never Smoker  . Smokeless tobacco: Never Used  Vaping Use  . Vaping Use: Never used  Substance and Sexual Activity  . Alcohol use: No    Alcohol/week: 0.0 standard drinks  . Drug use: No  . Sexual activity: Not Currently  Other Topics Concern  . Not on file  Social History Narrative   Patient is retired from [employer].  consumes 2 caffeine drinks daily and rates her diet as good. Will get back into exercising once weather is good again.        Social Determinants of Health   Financial Resource Strain:   . Difficulty of Paying Living Expenses:   Food Insecurity:   . Worried About Charity fundraiser in the Last Year:   . Arboriculturist in the Last Year:   Transportation Needs:   . Film/video editor (Medical):   Marland Kitchen Lack of Transportation (Non-Medical):   Physical Activity:   . Days  of Exercise per Week:   . Minutes of Exercise per Session:   Stress:   . Feeling of Stress :   Social Connections:   . Frequency of Communication with Friends and Family:   . Frequency of Social Gatherings with Friends and Family:   . Attends Religious Services:   . Active Member of Clubs or Organizations:   . Attends Archivist Meetings:   Marland Kitchen Marital Status:   Intimate Partner Violence:   . Fear of Current or Ex-Partner:   . Emotionally Abused:   Marland Kitchen Physically Abused:   . Sexually Abused:     Outpatient Medications Prior to Visit  Medication Sig Dispense Refill  . AMBULATORY NON FORMULARY MEDICATION Medication Name: glucometer and strips.  Whatever her insrance will cover.  Dx E11.9Test daily. 1 Units 0  . amLODipine (NORVASC) 10 MG tablet TAKE ONE TABLET BY MOUTH EVERY DAY 90 tablet 1  . atorvastatin (LIPITOR)  10 MG tablet TAKE ONE TABLET BY MOUTH AT BEDTIME 90 tablet 3  . Cholecalciferol 2000 UNITS CAPS Take by mouth.    Wallace Cullens POWD by Does not apply route.    . cyanocobalamin 1000 MCG tablet Take 1,000 mcg by mouth daily.    Marland Kitchen esomeprazole (NEXIUM) 40 MG capsule TAKE ONE CAPSULE BY MOUTH TWO TIMES A DAY BEFORE A MEAL. 180 capsule 3  . furosemide (LASIX) 40 MG tablet Take 1 tablet (40 mg total) by mouth daily as needed. 90 tablet 1  . JANUMET 50-1000 MG tablet Take 1 tablet by mouth 2 (two) times daily with a meal. 180 tablet 1  . LEVEMIR FLEXTOUCH 100 UNIT/ML Pen Inject 30 Units into the skin at bedtime. 45 mL 3  . levothyroxine (SYNTHROID) 100 MCG tablet TAKE ONE TABLET BY MOUTH EVERY DAY 90 tablet 2  . losartan (COZAAR) 100 MG tablet TAKE 1 TABLET BY MOUTH ONCE DAILY 90 tablet 1  . metoprolol succinate (TOPROL-XL) 25 MG 24 hr tablet Take 25 mg by mouth daily.    . Multiple Vitamin (MULTIVITAMIN) tablet Take 1 tablet by mouth daily.    . TRUE METRIX BLOOD GLUCOSE TEST test strip TEST DAILY 100 each 11  . TRULICITY 1.5 FU/9.3AT SOPN INJECT 1.5MG  INTO THE SKIN ONCE A WEEK 6 mL 3  . UNIFINE PENTIPS 32G X 4 MM MISC USE AS DIRECTED 100 each 3  . JARDIANCE 10 MG TABS tablet TAKE ONE TABLET BY MOUTH EVERY DAY 90 tablet 2   No facility-administered medications prior to visit.    Allergies  Allergen Reactions  . Amoxicillin-Pot Clavulanate Swelling  . Tetanus Toxoids Palpitations    ROS Review of Systems    Objective:    Physical Exam Constitutional:      Appearance: She is well-developed.  HENT:     Head: Normocephalic and atraumatic.  Cardiovascular:     Rate and Rhythm: Normal rate and regular rhythm.     Heart sounds: Normal heart sounds.  Pulmonary:     Effort: Pulmonary effort is normal.     Breath sounds: Normal breath sounds.  Skin:    General: Skin is warm and dry.  Neurological:     Mental Status: She is alert and oriented to person, place, and time.   Psychiatric:        Behavior: Behavior normal.     BP 132/67   Pulse 83   Ht 5' (1.524 m)   Wt 150 lb (68 kg)   SpO2 100%   BMI  29.29 kg/m  Wt Readings from Last 3 Encounters:  12/29/19 150 lb (68 kg)  09/19/19 147 lb (66.7 kg)  09/11/19 146 lb (66.2 kg)     There are no preventive care reminders to display for this patient.  There are no preventive care reminders to display for this patient.  Lab Results  Component Value Date   TSH 2.60 05/04/2019   No results found for: WBC, HGB, HCT, MCV, PLT Lab Results  Component Value Date   NA 139 10/23/2019   K 4.2 10/23/2019   CO2 25 10/23/2019   GLUCOSE 130 (H) 10/23/2019   BUN 29 (H) 10/23/2019   CREATININE 0.98 (H) 10/23/2019   BILITOT 1.5 (H) 05/04/2019   ALKPHOS 69 09/24/2016   AST 16 05/04/2019   ALT 10 05/04/2019   PROT 6.5 05/04/2019   ALBUMIN 4.3 09/24/2016   CALCIUM 9.5 10/23/2019   Lab Results  Component Value Date   CHOL 109 05/04/2019   Lab Results  Component Value Date   HDL 42 (L) 05/04/2019   Lab Results  Component Value Date   LDLCALC 47 05/04/2019   Lab Results  Component Value Date   TRIG 122 05/04/2019   Lab Results  Component Value Date   CHOLHDL 2.6 05/04/2019   Lab Results  Component Value Date   HGBA1C 7.1 (A) 12/29/2019      Assessment & Plan:   Problem List Items Addressed This Visit      Cardiovascular and Mediastinum   Essential hypertension    Well controlled. Continue current regimen. Follow up in  4 mo        Endocrine   Controlled type 2 diabetes mellitus with microalbuminuria, with long-term current use of insulin (HCC) - Primary    A1C looks great today at 7.1  Continue current regimen.  Is really made some great changes including not eating late at night, cutting back on sweets, she has been walking and exercising more and trying to drink water more so even though her A1c did not change much and just really encouraged her to keep up these great practices I  think it is more healthy.      Relevant Medications   Lancets MISC   Other Relevant Orders   POCT glycosylated hemoglobin (Hb A1C) (Completed)     Genitourinary   Chronic kidney disease (CKD) stage G3b/A1, moderately decreased glomerular filtration rate (GFR) between 30-44 mL/min/1.73 square meter and albuminuria creatinine ratio less than 30 mg/g     Other   B12 deficiency    Currently taking a gummy supplement.  Plan to recheck B12 at next office visit.         Meds ordered this encounter  Medications  . Lancets MISC    Sig: To be used to check blood sugars up to four (4) times daily. DX: E11.29    Dispense:  200 each    Refill:  11    Follow-up: Return in about 4 months (around 04/29/2020) for Diabetes follow-up.    [name], MD

## 2019-12-29 NOTE — Assessment & Plan Note (Signed)
Well controlled. Continue current regimen. Follow up in  4 mo 

## 2020-01-25 ENCOUNTER — Other Ambulatory Visit: Payer: Self-pay | Admitting: Family Medicine

## 2020-02-23 ENCOUNTER — Other Ambulatory Visit: Payer: Self-pay | Admitting: Family Medicine

## 2020-03-19 DIAGNOSIS — J029 Acute pharyngitis, unspecified: Secondary | ICD-10-CM | POA: Diagnosis not present

## 2020-03-19 DIAGNOSIS — R05 Cough: Secondary | ICD-10-CM | POA: Diagnosis not present

## 2020-03-19 DIAGNOSIS — Z79899 Other long term (current) drug therapy: Secondary | ICD-10-CM | POA: Diagnosis not present

## 2020-03-19 DIAGNOSIS — R0602 Shortness of breath: Secondary | ICD-10-CM | POA: Diagnosis not present

## 2020-03-19 DIAGNOSIS — R600 Localized edema: Secondary | ICD-10-CM | POA: Diagnosis not present

## 2020-03-19 DIAGNOSIS — I10 Essential (primary) hypertension: Secondary | ICD-10-CM | POA: Diagnosis not present

## 2020-03-19 DIAGNOSIS — E119 Type 2 diabetes mellitus without complications: Secondary | ICD-10-CM | POA: Diagnosis not present

## 2020-03-19 DIAGNOSIS — U071 COVID-19: Secondary | ICD-10-CM | POA: Diagnosis not present

## 2020-03-19 DIAGNOSIS — Z887 Allergy status to serum and vaccine status: Secondary | ICD-10-CM | POA: Diagnosis not present

## 2020-03-19 DIAGNOSIS — R9431 Abnormal electrocardiogram [ECG] [EKG]: Secondary | ICD-10-CM | POA: Diagnosis not present

## 2020-03-19 DIAGNOSIS — Z880 Allergy status to penicillin: Secondary | ICD-10-CM | POA: Diagnosis not present

## 2020-03-19 DIAGNOSIS — E785 Hyperlipidemia, unspecified: Secondary | ICD-10-CM | POA: Diagnosis not present

## 2020-03-19 DIAGNOSIS — J984 Other disorders of lung: Secondary | ICD-10-CM | POA: Diagnosis not present

## 2020-03-21 ENCOUNTER — Other Ambulatory Visit: Payer: Self-pay | Admitting: Family Medicine

## 2020-03-26 ENCOUNTER — Telehealth: Payer: Self-pay

## 2020-03-26 NOTE — Telephone Encounter (Signed)
Patient's daughter called stating that she was seen in ER on 03/19/20 for pneumonia due to [place].   Patient is scheduled on Friday at 2:40 (in an acute slot) for hospital follow up. Your next opening was Thursday, but daughter would not be there to assist patient with phone call visit.   Daughter is requesting a CXR to be ordered sooner, so that she could take patient to get that done. States patient is feeling much better and is doing well. Please advise

## 2020-03-26 NOTE — Telephone Encounter (Signed)
She was just diagnosed a week ago than it is too early to repeat a chest x-ray especially for looking for resolution.  Typically we follow with symptoms at this point and then we repeat the chest x-ray in 3 to 4 weeks.  Welcome to do virtual or in person whichever is better for her

## 2020-03-26 NOTE — Telephone Encounter (Signed)
Called dtr, no answer and no VM set up. Phone rang multiple times then disconnected

## 2020-03-29 ENCOUNTER — Encounter: Payer: Self-pay | Admitting: Family Medicine

## 2020-03-29 ENCOUNTER — Telehealth (INDEPENDENT_AMBULATORY_CARE_PROVIDER_SITE_OTHER): Payer: Medicare Other | Admitting: Family Medicine

## 2020-03-29 DIAGNOSIS — R809 Proteinuria, unspecified: Secondary | ICD-10-CM | POA: Diagnosis not present

## 2020-03-29 DIAGNOSIS — E1129 Type 2 diabetes mellitus with other diabetic kidney complication: Secondary | ICD-10-CM

## 2020-03-29 DIAGNOSIS — Z8616 Personal history of COVID-19: Secondary | ICD-10-CM | POA: Insufficient documentation

## 2020-03-29 DIAGNOSIS — E871 Hypo-osmolality and hyponatremia: Secondary | ICD-10-CM | POA: Diagnosis not present

## 2020-03-29 DIAGNOSIS — J1282 Pneumonia due to coronavirus disease 2019: Secondary | ICD-10-CM

## 2020-03-29 DIAGNOSIS — Z794 Long term (current) use of insulin: Secondary | ICD-10-CM

## 2020-03-29 DIAGNOSIS — U071 COVID-19: Secondary | ICD-10-CM

## 2020-03-29 NOTE — Assessment & Plan Note (Addendum)
Doing much better. Discussed continue weaning oxygen. She is actually doing really well. Wean the nighttime oxygen last.  Continue PRN albuterol. Would like to have a repeat chest x-ray. I don't think this is unreasonable so we discussed that typically we do it 3 to 4 weeks after the acute infection just to make sure it is resolved because it can oftentimes take several weeks for the chest x-ray to normalize.

## 2020-03-29 NOTE — Assessment & Plan Note (Signed)
Decrease Levemir to 26 units since she has had a couple of hypoglycemic events. Hopefully the blood sugars will normalize over the next week or 2 as she is getting back into her normal routine.

## 2020-03-29 NOTE — Progress Notes (Signed)
Virtual Visit via Telephone Note  I connected with Yolanda Fields on 03/29/20 at  2:40 PM EDT by telephone and verified that I am speaking with the correct person using two identifiers.   I discussed the limitations, risks, security and privacy concerns of performing an evaluation and management service by telephone and the availability of in person appointments. I also discussed with the patient that there may be a patient responsible charge related to this service. The patient expressed understanding and agreed to proceed.  Patient location: at home  Provider loccation: In office  Daughter susan also on the call.     Established Patient Office Visit  Subjective:  Patient ID: Yolanda Fields, female    DOB: 09-21-1945  Age: 74 y.o. MRN: [id number]  CC: No chief complaint on file.   HPI Yolanda Fields presents for hospital follow-up.  Yolanda Fields is a 74 year old female who presented to the [facility] emergency department on August 31.  She was having sore throat, cough and flulike symptoms for 4 days at that time.  She was diagnosed with Covid pneumonia.  Metabolic panel revealed borderline low sodium at 132 and low albumin at 3.3.  Creatinine was stable at 1.0.  Her hemoglobin and white blood cell count were normal.  She says she is feeling significantly better since being at the emergency department 10 days ago. She is still on oxygen she said she went home on about 3-1/2 L she says she is now down to about 1/2 L and has been trying to go very short spans during the day without it she says usually her oxygen is staying around 97% at rest which she says is her actual baseline but has dropped a little bit less with activity but nothing below 90%. She did complete the prednisone that she was given and she has been using albuterol some but not very much.  In regards to her diabetes she has had 2 hypoglycemic events in the 60s. She feels like she still eating pretty  normally.  Past Medical History:  Diagnosis Date  . Diabetes mellitus without complication ([place])   . Hyperlipidemia   . Hypertension     Past Surgical History:  Procedure Laterality Date  . CESAREAN SECTION      Family History  Problem Relation Age of Onset  . Bladder Cancer Brother   . Cancer Sister   . Heart failure Mother 31  . Diabetes Mother   . Heart failure Brother   . Diabetes Brother   . Pancreatic cancer Brother   . Diabetes Sister   . Coronary artery disease Father 13  . Heart attack Father     Social History   Socioeconomic History  . Marital status: Divorced    Spouse name: Not on file  . Number of children: 4  . Years of education: HS  . Highest education level: 12th grade  Occupational History  . Occupation: Retired from [employer]   Tobacco Use  . Smoking status: Never Smoker  . Smokeless tobacco: Never Used  Vaping Use  . Vaping Use: Never used  Substance and Sexual Activity  . Alcohol use: No    Alcohol/week: 0.0 standard drinks  . Drug use: No  . Sexual activity: Not Currently  Other Topics Concern  . Not on file  Social History Narrative   Patient is retired from [employer].  consumes 2 caffeine drinks daily and rates her diet as good. Will get back into exercising once weather is  good again.        Social Determinants of Health   Financial Resource Strain:   . Difficulty of Paying Living Expenses: Not on file  Food Insecurity:   . Worried About Charity fundraiser in the Last Year: Not on file  . Ran Out of Food in the Last Year: Not on file  Transportation Needs:   . Lack of Transportation (Medical): Not on file  . Lack of Transportation (Non-Medical): Not on file  Physical Activity:   . Days of Exercise per Week: Not on file  . Minutes of Exercise per Session: Not on file  Stress:   . Feeling of Stress : Not on file  Social Connections:   . Frequency of Communication with Friends and Family: Not on file  . Frequency of Social  Gatherings with Friends and Family: Not on file  . Attends Religious Services: Not on file  . Active Member of Clubs or Organizations: Not on file  . Attends Archivist Meetings: Not on file  . Marital Status: Not on file  Intimate Partner Violence:   . Fear of Current or Ex-Partner: Not on file  . Emotionally Abused: Not on file  . Physically Abused: Not on file  . Sexually Abused: Not on file    Outpatient Medications Prior to Visit  Medication Sig Dispense Refill  . AMBULATORY NON FORMULARY MEDICATION Medication Name: glucometer and strips.  Whatever her insrance will cover.  Dx E11.9Test daily. 1 Units 0  . amLODipine (NORVASC) 10 MG tablet TAKE ONE TABLET BY MOUTH EVERY DAY 90 tablet 1  . atorvastatin (LIPITOR) 10 MG tablet TAKE ONE TABLET BY MOUTH AT BEDTIME 90 tablet 3  . Cholecalciferol 2000 UNITS CAPS Take by mouth.    Wallace Cullens POWD by Does not apply route.    . cyanocobalamin 1000 MCG tablet Take 1,000 mcg by mouth daily.    Marland Kitchen esomeprazole (NEXIUM) 40 MG capsule TAKE ONE CAPSULE BY MOUTH TWO TIMES A DAY BEFORE A MEAL. 180 capsule 3  . furosemide (LASIX) 40 MG tablet Take 1 tablet (40 mg total) by mouth daily as needed. 90 tablet 1  . JANUMET 50-1000 MG tablet Take 1 tablet by mouth 2 (two) times daily with a meal. 180 tablet 1  . Lancets MISC To be used to check blood sugars up to four (4) times daily. DX: E11.29 200 each 11  . LEVEMIR FLEXTOUCH 100 UNIT/ML Pen Inject 30 Units into the skin at bedtime. 45 mL 3  . levothyroxine (SYNTHROID) 100 MCG tablet TAKE ONE TABLET BY MOUTH EVERY DAY 90 tablet 2  . losartan (COZAAR) 100 MG tablet TAKE 1 TABLET BY MOUTH ONCE DAILY 90 tablet 1  . metoprolol succinate (TOPROL-XL) 25 MG 24 hr tablet Take 25 mg by mouth daily.    . Multiple Vitamin (MULTIVITAMIN) tablet Take 1 tablet by mouth daily.    . TRUE METRIX BLOOD GLUCOSE TEST test strip TEST DAILY 100 each 11  . TRUEPLUS PEN NEEDLES 32G X 4 MM MISC USE AS DIRECTED 100  each 3  . TRULICITY 1.5 VO/5.3GU SOPN INJECT 1.5MG  INTO THE SKIN ONCE A WEEK 6 mL 3   No facility-administered medications prior to visit.    Allergies  Allergen Reactions  . Amoxicillin-Pot Clavulanate Swelling  . Tetanus Toxoids Palpitations    ROS Review of Systems    Objective:    Physical Exam  There were no vitals taken for this visit. Wt Readings from  Last 3 Encounters:  12/29/19 150 lb (68 kg)  09/19/19 147 lb (66.7 kg)  09/11/19 146 lb (66.2 kg)     Health Maintenance Due  Topic Date Due  . INFLUENZA VACCINE  02/18/2020    There are no preventive care reminders to display for this patient.  Lab Results  Component Value Date   TSH 2.60 05/04/2019   No results found for: WBC, HGB, HCT, MCV, PLT Lab Results  Component Value Date   NA 139 10/23/2019   K 4.2 10/23/2019   CO2 25 10/23/2019   GLUCOSE 130 (H) 10/23/2019   BUN 29 (H) 10/23/2019   CREATININE 0.98 (H) 10/23/2019   BILITOT 1.5 (H) 05/04/2019   ALKPHOS 69 09/24/2016   AST 16 05/04/2019   ALT 10 05/04/2019   PROT 6.5 05/04/2019   ALBUMIN 4.3 09/24/2016   CALCIUM 9.5 10/23/2019   Lab Results  Component Value Date   CHOL 109 05/04/2019   Lab Results  Component Value Date   HDL 42 (L) 05/04/2019   Lab Results  Component Value Date   LDLCALC 47 05/04/2019   Lab Results  Component Value Date   TRIG 122 05/04/2019   Lab Results  Component Value Date   CHOLHDL 2.6 05/04/2019   Lab Results  Component Value Date   HGBA1C 7.1 (A) 12/29/2019      Assessment & Plan:   Problem List Items Addressed This Visit      Respiratory   Pneumonia due to COVID-19 virus - Primary    Doing much better. Discussed continue weaning oxygen. She is actually doing really well. Wean the nighttime oxygen last.  Continue PRN albuterol. Would like to have a repeat chest x-ray. I don't think this is unreasonable so we discussed that typically we do it 3 to 4 weeks after the acute infection just to make  sure it is resolved because it can oftentimes take several weeks for the chest x-ray to normalize.      Relevant Orders   BASIC METABOLIC PANEL WITH GFR   DG Chest 2 View     Endocrine   Controlled type 2 diabetes mellitus with microalbuminuria, with long-term current use of insulin (HCC)    Decrease Levemir to 26 units since she has had a couple of hypoglycemic events. Hopefully the blood sugars will normalize over the next week or 2 as she is getting back into her normal routine.      Relevant Orders   BASIC METABOLIC PANEL WITH GFR    Other Visit Diagnoses    Hyponatremia       Relevant Orders   BASIC METABOLIC PANEL WITH GFR     Hyponatremia-we'll recheck BMP. Also interested to see what her CO2 level as her daughter was very worried about her ABG results during the emergency department visit.  No orders of the defined types were placed in this encounter.   Follow-up: No follow-ups on file.   I discussed the assessment and treatment plan with the patient. The patient was provided an opportunity to ask questions and all were answered. The patient agreed with the plan and demonstrated an understanding of the instructions.   The patient was advised to call back or seek an in-person evaluation if the symptoms worsen or if the condition fails to improve as anticipated.  I provided 25 minutes of non-face-to-face time during this encounter.    [name], MD

## 2020-04-04 DIAGNOSIS — R809 Proteinuria, unspecified: Secondary | ICD-10-CM | POA: Diagnosis not present

## 2020-04-04 DIAGNOSIS — E1129 Type 2 diabetes mellitus with other diabetic kidney complication: Secondary | ICD-10-CM | POA: Diagnosis not present

## 2020-04-04 DIAGNOSIS — E871 Hypo-osmolality and hyponatremia: Secondary | ICD-10-CM | POA: Diagnosis not present

## 2020-04-04 LAB — BASIC METABOLIC PANEL WITH GFR
BUN/Creatinine Ratio: 21 (calc) (ref 6–22)
BUN: 22 mg/dL (ref 7–25)
CO2: 26 mmol/L (ref 20–32)
Calcium: 9.3 mg/dL (ref 8.6–10.4)
Chloride: 104 mmol/L (ref 98–110)
Creat: 1.06 mg/dL — ABNORMAL HIGH (ref 0.60–0.93)
GFR, Est African American: 59 mL/min/{1.73_m2} — ABNORMAL LOW (ref >=60)
GFR, Est Non African American: 51 mL/min/{1.73_m2} — ABNORMAL LOW (ref >=60)
Glucose, Bld: 165 mg/dL — ABNORMAL HIGH (ref 65–99)
Potassium: 4.4 mmol/L (ref 3.5–5.3)
Sodium: 140 mmol/L (ref 135–146)

## 2020-04-08 DIAGNOSIS — H401132 Primary open-angle glaucoma, bilateral, moderate stage: Secondary | ICD-10-CM | POA: Diagnosis not present

## 2020-04-17 ENCOUNTER — Other Ambulatory Visit: Payer: Self-pay

## 2020-04-17 ENCOUNTER — Ambulatory Visit (INDEPENDENT_AMBULATORY_CARE_PROVIDER_SITE_OTHER): Payer: Medicare Other

## 2020-04-17 DIAGNOSIS — J9811 Atelectasis: Secondary | ICD-10-CM | POA: Diagnosis not present

## 2020-04-17 DIAGNOSIS — J1282 Pneumonia due to coronavirus disease 2019: Secondary | ICD-10-CM

## 2020-04-17 DIAGNOSIS — U071 COVID-19: Secondary | ICD-10-CM | POA: Diagnosis not present

## 2020-04-19 MED ORDER — AZITHROMYCIN 250 MG PO TABS: ORAL_TABLET | ORAL | 0 refills | Status: AC

## 2020-04-29 ENCOUNTER — Ambulatory Visit: Payer: Medicare Other | Admitting: Family Medicine

## 2020-04-30 ENCOUNTER — Ambulatory Visit (INDEPENDENT_AMBULATORY_CARE_PROVIDER_SITE_OTHER): Payer: Medicare Other | Admitting: Family Medicine

## 2020-04-30 ENCOUNTER — Encounter: Payer: Self-pay | Admitting: Family Medicine

## 2020-04-30 VITALS — BP 126/49 | HR 127 | Wt 147.0 lb

## 2020-04-30 DIAGNOSIS — N1832 Chronic kidney disease, stage 3b: Secondary | ICD-10-CM

## 2020-04-30 DIAGNOSIS — I10 Essential (primary) hypertension: Secondary | ICD-10-CM | POA: Diagnosis not present

## 2020-04-30 DIAGNOSIS — Z23 Encounter for immunization: Secondary | ICD-10-CM

## 2020-04-30 DIAGNOSIS — E1129 Type 2 diabetes mellitus with other diabetic kidney complication: Secondary | ICD-10-CM

## 2020-04-30 DIAGNOSIS — Z794 Long term (current) use of insulin: Secondary | ICD-10-CM

## 2020-04-30 DIAGNOSIS — R809 Proteinuria, unspecified: Secondary | ICD-10-CM | POA: Diagnosis not present

## 2020-04-30 DIAGNOSIS — Z8616 Personal history of COVID-19: Secondary | ICD-10-CM

## 2020-04-30 DIAGNOSIS — E038 Other specified hypothyroidism: Secondary | ICD-10-CM | POA: Diagnosis not present

## 2020-04-30 LAB — POCT GLYCOSYLATED HEMOGLOBIN (HGB A1C): Hemoglobin A1C: 7.1 % — AB (ref 4.0–5.6)

## 2020-04-30 LAB — POCT UA - MICROALBUMIN
Albumin/Creatinine Ratio, Urine, POC: ABNORMAL
Creatinine, POC: 200 mg/dL
Microalbumin Ur, POC: 80 mg/L

## 2020-04-30 NOTE — Assessment & Plan Note (Signed)
Continue to follow renal function every 6 months. 

## 2020-04-30 NOTE — Assessment & Plan Note (Signed)
Well controlled. Continue current regimen. Follow up in  4 mo 

## 2020-04-30 NOTE — Progress Notes (Signed)
Established Patient Office Visit  Subjective:  Patient ID: Yolanda Fields, female    DOB: 1946/07/08  Age: 74 y.o. MRN: [id number]  CC:  Chief Complaint  Patient presents with  . Diabetes    HPI [name] presents for   Hypertension- Pt denies chest pain, SOB, dizziness, or heart palpitations.  Taking meds as directed w/o problems.  Denies medication side effects.    Diabetes - no hypoglycemic events. No wounds or sores that are not healing well. No increased thirst or urination. Checking glucose at home. Taking medications as prescribed without any side effects.  She was also seen in the emergency department on August 31 for Covid pneumonia.  She was discharged home on oxygen and has been able to taper off completely during the daytime but is still wearing it at night..  We recently rechecked her kidney function about 3 weeks ago after having had Covid and everything was stable.   Hypothyroidism - Taking medication regularly in the AM away from food and vitamins, etc. No recent change to skin, hair, or energy levels.  Also wants me to look at her ears today just to make sure that she does not have any residual infection she.  She has a chronic perforation left ear.  She says the Z-Pak that I gave her did help.  Past Medical History:  Diagnosis Date  . Diabetes mellitus without complication ([place])   . Hyperlipidemia   . Hypertension     Past Surgical History:  Procedure Laterality Date  . CESAREAN SECTION      Family History  Problem Relation Age of Onset  . Bladder Cancer Brother   . Cancer Sister   . Heart failure Mother 75  . Diabetes Mother   . Heart failure Brother   . Diabetes Brother   . Pancreatic cancer Brother   . Diabetes Sister   . Coronary artery disease Father 12  . Heart attack Father     Social History   Socioeconomic History  . Marital status: Divorced    Spouse name: Not on file  . Number of children: 4  . Years of education: HS  .  Highest education level: 12th grade  Occupational History  . Occupation: Retired from [employer]   Tobacco Use  . Smoking status: Never Smoker  . Smokeless tobacco: Never Used  Vaping Use  . Vaping Use: Never used  Substance and Sexual Activity  . Alcohol use: No    Alcohol/week: 0.0 standard drinks  . Drug use: No  . Sexual activity: Not Currently  Other Topics Concern  . Not on file  Social History Narrative   Patient is retired from [employer].  consumes 2 caffeine drinks daily and rates her diet as good. Will get back into exercising once weather is good again.        Social Determinants of Health   Financial Resource Strain:   . Difficulty of Paying Living Expenses: Not on file  Food Insecurity:   . Worried About Charity fundraiser in the Last Year: Not on file  . Ran Out of Food in the Last Year: Not on file  Transportation Needs:   . Lack of Transportation (Medical): Not on file  . Lack of Transportation (Non-Medical): Not on file  Physical Activity:   . Days of Exercise per Week: Not on file  . Minutes of Exercise per Session: Not on file  Stress:   . Feeling of Stress : Not on file  Social Connections:   . Frequency of Communication with Friends and Family: Not on file  . Frequency of Social Gatherings with Friends and Family: Not on file  . Attends Religious Services: Not on file  . Active Member of Clubs or Organizations: Not on file  . Attends Archivist Meetings: Not on file  . Marital Status: Not on file  Intimate Partner Violence:   . Fear of Current or Ex-Partner: Not on file  . Emotionally Abused: Not on file  . Physically Abused: Not on file  . Sexually Abused: Not on file    Outpatient Medications Prior to Visit  Medication Sig Dispense Refill  . AMBULATORY NON FORMULARY MEDICATION Medication Name: glucometer and strips.  Whatever her insrance will cover.  Dx E11.9Test daily. 1 Units 0  . amLODipine (NORVASC) 10 MG tablet TAKE ONE TABLET BY MOUTH  EVERY DAY 90 tablet 1  . Cholecalciferol 2000 UNITS CAPS Take by mouth.    Wallace Cullens POWD by Does not apply route.    . cyanocobalamin 1000 MCG tablet Take 1,000 mcg by mouth daily.    Marland Kitchen esomeprazole (NEXIUM) 40 MG capsule TAKE ONE CAPSULE BY MOUTH TWO TIMES A DAY BEFORE A MEAL. 180 capsule 3  . furosemide (LASIX) 40 MG tablet Take 1 tablet (40 mg total) by mouth daily as needed. 90 tablet 1  . JANUMET 50-1000 MG tablet Take 1 tablet by mouth 2 (two) times daily with a meal. 180 tablet 1  . Lancets MISC To be used to check blood sugars up to four (4) times daily. DX: E11.29 200 each 11  . LEVEMIR FLEXTOUCH 100 UNIT/ML Pen Inject 30 Units into the skin at bedtime. 45 mL 3  . levothyroxine (SYNTHROID) 100 MCG tablet TAKE ONE TABLET BY MOUTH EVERY DAY 90 tablet 2  . losartan (COZAAR) 100 MG tablet TAKE 1 TABLET BY MOUTH ONCE DAILY 90 tablet 1  . metoprolol succinate (TOPROL-XL) 25 MG 24 hr tablet Take 25 mg by mouth daily.    . Multiple Vitamin (MULTIVITAMIN) tablet Take 1 tablet by mouth daily.    . TRUE METRIX BLOOD GLUCOSE TEST test strip TEST DAILY 100 each 11  . TRUEPLUS PEN NEEDLES 32G X 4 MM MISC USE AS DIRECTED 100 each 3  . TRULICITY 1.5 KK/9.3GH SOPN INJECT 1.5MG  INTO THE SKIN ONCE A WEEK 6 mL 3  . atorvastatin (LIPITOR) 10 MG tablet TAKE ONE TABLET BY MOUTH AT BEDTIME (Patient not taking: Reported on 04/30/2020) 90 tablet 3   No facility-administered medications prior to visit.    Allergies  Allergen Reactions  . Amoxicillin-Pot Clavulanate Swelling  . Tetanus Toxoids Palpitations    ROS Review of Systems    Objective:    Physical Exam Constitutional:      Appearance: She is well-developed.  HENT:     Head: Normocephalic and atraumatic.     Right Ear: Tympanic membrane, ear canal and external ear normal.     Left Ear: Tympanic membrane, ear canal and external ear normal.     Ears:     Comments: Chronic perforation of left TM.    Nose: Nose normal.      Mouth/Throat:     Mouth: Mucous membranes are moist.     Pharynx: Oropharynx is clear. No oropharyngeal exudate or posterior oropharyngeal erythema.  Eyes:     Conjunctiva/sclera: Conjunctivae normal.     Pupils: Pupils are equal, round, and reactive to light.  Neck:  Thyroid: No thyromegaly.  Cardiovascular:     Rate and Rhythm: Normal rate and regular rhythm.     Heart sounds: Normal heart sounds.  Pulmonary:     Effort: Pulmonary effort is normal.     Breath sounds: Normal breath sounds. No wheezing.  Musculoskeletal:     Cervical back: Neck supple.  Lymphadenopathy:     Cervical: No cervical adenopathy.  Skin:    General: Skin is warm and dry.  Neurological:     Mental Status: She is alert and oriented to person, place, and time.  Psychiatric:        Behavior: Behavior normal.     BP (!) 126/49   Pulse (!) 127   Wt 147 lb (66.7 kg)   SpO2 96%   BMI 28.71 kg/m  Wt Readings from Last 3 Encounters:  04/30/20 147 lb (66.7 kg)  12/29/19 150 lb (68 kg)  09/19/19 147 lb (66.7 kg)     There are no preventive care reminders to display for this patient.  There are no preventive care reminders to display for this patient.  Lab Results  Component Value Date   TSH 2.60 05/04/2019   No results found for: WBC, HGB, HCT, MCV, PLT Lab Results  Component Value Date   NA 140 04/04/2020   K 4.4 04/04/2020   CO2 26 04/04/2020   GLUCOSE 165 (H) 04/04/2020   BUN 22 04/04/2020   CREATININE 1.06 (H) 04/04/2020   BILITOT 1.5 (H) 05/04/2019   ALKPHOS 69 09/24/2016   AST 16 05/04/2019   ALT 10 05/04/2019   PROT 6.5 05/04/2019   ALBUMIN 4.3 09/24/2016   CALCIUM 9.3 04/04/2020   Lab Results  Component Value Date   CHOL 109 05/04/2019   Lab Results  Component Value Date   HDL 42 (L) 05/04/2019   Lab Results  Component Value Date   LDLCALC 47 05/04/2019   Lab Results  Component Value Date   TRIG 122 05/04/2019   Lab Results  Component Value Date   CHOLHDL 2.6  05/04/2019   Lab Results  Component Value Date   HGBA1C 7.1 (A) 04/30/2020      Assessment & Plan:   Problem List Items Addressed This Visit      Cardiovascular and Mediastinum   Essential hypertension    Well controlled. Continue current regimen. Follow up in  4 mo      Relevant Orders   Lipid Panel w/reflex Direct LDL     Endocrine   Hypothyroidism    Currently asymptomatic on current regimen.  Due to recheck TSH.      Relevant Orders   TSH   Controlled type 2 diabetes mellitus with microalbuminuria, with long-term current use of insulin ([place]) - Primary    Well controlled. Continue current regimen. Follow up in  4 mo      Relevant Orders   Lipid Panel w/reflex Direct LDL   POCT HgB A1C (Completed)   POCT UA - Microalbumin (Completed)     Genitourinary   Chronic kidney disease (CKD) stage G3b/A1, moderately decreased glomerular filtration rate (GFR) between 30-44 mL/min/1.73 square meter and albuminuria creatinine ratio less than 30 mg/g (HCC)    Continue to follow renal function every 6 months.       Other Visit Diagnoses    Flu vaccine need       Relevant Orders   Flu Vaccine QUAD High Dose(Fluad) (Completed)      She is also reported that she had  not been taking her atorvastatin because she could not get a refill but per our records we had sent a years worth of refills over to the pharmacy in July so I am not sure we discussed that we would call the pharmacy and clarify what was going on and send a new prescription if needed.  No orders of the defined types were placed in this encounter.   Follow-up: Return in about 3 months (around 07/31/2020).    [name], MD

## 2020-04-30 NOTE — Assessment & Plan Note (Signed)
Still requiring some oxygen at night.  Hopefully will be able to continue to taper off in the next few weeks.

## 2020-04-30 NOTE — Patient Instructions (Signed)
Please restart the atorvastatin.  When she been on it for a week please go to the lab to have your blood work checked.

## 2020-04-30 NOTE — Assessment & Plan Note (Signed)
Currently asymptomatic on current regimen.  Due to recheck TSH.

## 2020-05-07 ENCOUNTER — Telehealth: Payer: Self-pay

## 2020-05-07 ENCOUNTER — Other Ambulatory Visit: Payer: Self-pay

## 2020-05-07 DIAGNOSIS — U071 COVID-19: Secondary | ICD-10-CM

## 2020-05-07 NOTE — Telephone Encounter (Signed)
Yolanda Fields called and asked about the micro albumin she had in the office. She wanted to know the results. Please advise.

## 2020-05-07 NOTE — Telephone Encounter (Signed)
Patient advised.

## 2020-05-07 NOTE — Telephone Encounter (Signed)
Results were entered incorrectly.  We did go back and pull the original.  Ratio was 30:300 which is where she has been running for the last 3 years so it is stable.  Seems to continue to take the losartan which helps protect the kidneys from her diabetes.

## 2020-05-08 ENCOUNTER — Ambulatory Visit (INDEPENDENT_AMBULATORY_CARE_PROVIDER_SITE_OTHER): Payer: Medicare Other

## 2020-05-08 ENCOUNTER — Other Ambulatory Visit: Payer: Self-pay

## 2020-05-08 DIAGNOSIS — J1282 Pneumonia due to coronavirus disease 2019: Secondary | ICD-10-CM | POA: Diagnosis not present

## 2020-05-08 DIAGNOSIS — U071 COVID-19: Secondary | ICD-10-CM

## 2020-05-29 LAB — LIPID PANEL W/REFLEX DIRECT LDL
Cholesterol: 108 mg/dL (ref <200)
HDL: 36 mg/dL — ABNORMAL LOW (ref >=50)
LDL Cholesterol (Calc): 50 mg/dL (calc)
Non-HDL Cholesterol (Calc): 72 mg/dL (calc) (ref <130)
Total CHOL/HDL Ratio: 3 (calc) (ref <5.0)
Triglycerides: 136 mg/dL (ref <150)

## 2020-05-29 LAB — TSH: TSH: 5.26 mIU/L — ABNORMAL HIGH (ref 0.40–4.50)

## 2020-05-31 ENCOUNTER — Other Ambulatory Visit: Payer: Self-pay

## 2020-05-31 DIAGNOSIS — E039 Hypothyroidism, unspecified: Secondary | ICD-10-CM

## 2020-06-08 ENCOUNTER — Other Ambulatory Visit: Payer: Self-pay | Admitting: Family Medicine

## 2020-06-20 ENCOUNTER — Other Ambulatory Visit: Payer: Self-pay | Admitting: Family Medicine

## 2020-06-27 ENCOUNTER — Other Ambulatory Visit: Payer: Self-pay | Admitting: Family Medicine

## 2020-07-31 ENCOUNTER — Ambulatory Visit (INDEPENDENT_AMBULATORY_CARE_PROVIDER_SITE_OTHER): Payer: Medicare Other | Admitting: Family Medicine

## 2020-07-31 ENCOUNTER — Encounter: Payer: Self-pay | Admitting: Family Medicine

## 2020-07-31 ENCOUNTER — Other Ambulatory Visit: Payer: Self-pay

## 2020-07-31 VITALS — BP 128/68 | HR 96 | Wt 149.0 lb

## 2020-07-31 DIAGNOSIS — I10 Essential (primary) hypertension: Secondary | ICD-10-CM | POA: Diagnosis not present

## 2020-07-31 DIAGNOSIS — E039 Hypothyroidism, unspecified: Secondary | ICD-10-CM

## 2020-07-31 DIAGNOSIS — Z794 Long term (current) use of insulin: Secondary | ICD-10-CM

## 2020-07-31 DIAGNOSIS — R809 Proteinuria, unspecified: Secondary | ICD-10-CM

## 2020-07-31 DIAGNOSIS — E1129 Type 2 diabetes mellitus with other diabetic kidney complication: Secondary | ICD-10-CM

## 2020-07-31 LAB — TSH: TSH: 3.02 mIU/L (ref 0.40–4.50)

## 2020-07-31 LAB — POCT GLYCOSYLATED HEMOGLOBIN (HGB A1C): HbA1c POC (<> result, manual entry): 6.8 % (ref 4.0–5.6)

## 2020-07-31 MED ORDER — EMPAGLIFLOZIN-METFORMIN HCL ER 10-1000 MG PO TB24: 1.0000 | ORAL_TABLET | Freq: Every day | ORAL | 5 refills | Status: DC

## 2020-07-31 NOTE — Patient Instructions (Addendum)
Ok to stop your Janumet.  Right now you are on the Trulicity so that is doing the bulk of the work.  I am going to refill your Vania Rea but is also got have the metformin at.  Is going to be called empagliflozin/metformin.  Is can to be the extended release version stating only have to take 1 pill 1 time a day.  Keep doing your insulin the same way no changes on that today everything looks great keep up the good work.  Please go to the lab to recheck your thyroid level.

## 2020-07-31 NOTE — Progress Notes (Signed)
Established Patient Office Visit  Subjective:  Patient ID: Yolanda Fields, female    DOB: 1945/11/07  Age: 75 y.o. MRN: [id number]  CC:  Chief Complaint  Patient presents with  . Diabetes    HPI Lucelia Lacey presents for   Diabetes - no hypoglycemic events. No wounds or sores that are not healing well. No increased thirst or urination. Checking glucose at home. Taking medications as prescribed without any side effects.  Hypothyroidism - Taking medication regularly in the AM away from food and vitamins, etc. No recent change to skin, hair, or energy levels.  Last TSH was mildly elevated at 5.26 in November.  She is due to recheck her thyroid level.   Past Medical History:  Diagnosis Date  . Diabetes mellitus without complication ([place])   . Hyperlipidemia   . Hypertension     Past Surgical History:  Procedure Laterality Date  . CESAREAN SECTION      Family History  Problem Relation Age of Onset  . Bladder Cancer Brother   . Cancer Sister   . Heart failure Mother 83  . Diabetes Mother   . Heart failure Brother   . Diabetes Brother   . Pancreatic cancer Brother   . Diabetes Sister   . Coronary artery disease Father 61  . Heart attack Father     Social History   Socioeconomic History  . Marital status: Divorced    Spouse name: Not on file  . Number of children: 4  . Years of education: HS  . Highest education level: 12th grade  Occupational History  . Occupation: Retired from [employer]   Tobacco Use  . Smoking status: Never Smoker  . Smokeless tobacco: Never Used  Vaping Use  . Vaping Use: Never used  Substance and Sexual Activity  . Alcohol use: No    Alcohol/week: 0.0 standard drinks  . Drug use: No  . Sexual activity: Not Currently  Other Topics Concern  . Not on file  Social History Narrative   Patient is retired from [employer].  consumes 2 caffeine drinks daily and rates her diet as good. Will get back into exercising once weather is good again.         Social Determinants of Health   Financial Resource Strain: Not on file  Food Insecurity: Not on file  Transportation Needs: Not on file  Physical Activity: Not on file  Stress: Not on file  Social Connections: Not on file  Intimate Partner Violence: Not on file    Outpatient Medications Prior to Visit  Medication Sig Dispense Refill  . AMBULATORY NON FORMULARY MEDICATION Medication Name: glucometer and strips.  Whatever her insrance will cover.  Dx E11.9Test daily. 1 Units 0  . amLODipine (NORVASC) 10 MG tablet TAKE ONE TABLET BY MOUTH EVERY DAY 90 tablet 1  . atorvastatin (LIPITOR) 10 MG tablet TAKE ONE TABLET BY MOUTH AT BEDTIME (Patient not taking: Reported on 04/30/2020) 90 tablet 3  . Cholecalciferol 2000 UNITS CAPS Take by mouth.    Wallace Cullens POWD by Does not apply route.    . cyanocobalamin 1000 MCG tablet Take 1,000 mcg by mouth daily.    Marland Kitchen esomeprazole (NEXIUM) 40 MG capsule TAKE ONE CAPSULE BY MOUTH TWO TIMES A DAY BEFORE A MEAL. 180 capsule 3  . furosemide (LASIX) 40 MG tablet Take 1 tablet (40 mg total) by mouth daily as needed. 90 tablet 1  . Lancets MISC To be used to check blood sugars up  to four (4) times daily. DX: E11.29 200 each 11  . LEVEMIR FLEXTOUCH 100 UNIT/ML FlexPen Inject 30 Units into the skin at bedtime. 45 mL 3  . levothyroxine (SYNTHROID) 100 MCG tablet TAKE ONE TABLET BY MOUTH EVERY DAY 90 tablet 2  . losartan (COZAAR) 100 MG tablet TAKE 1 TABLET BY MOUTH ONCE DAILY 90 tablet 1  . metoprolol succinate (TOPROL-XL) 25 MG 24 hr tablet Take 25 mg by mouth daily.    . Multiple Vitamin (MULTIVITAMIN) tablet Take 1 tablet by mouth daily.    . TRUE METRIX BLOOD GLUCOSE TEST test strip TEST DAILY 100 each 11  . TRUEPLUS PEN NEEDLES 32G X 4 MM MISC USE AS DIRECTED 100 each 3  . TRULICITY 1.5 0000000 SOPN INJECT 1.5MG  INTO THE SKIN ONCE A WEEK 6 mL 3  . JANUMET 50-1000 MG tablet Take 1 tablet by mouth 2 (two) times daily with a meal. 180 tablet 1   No  facility-administered medications prior to visit.    Allergies  Allergen Reactions  . Amoxicillin-Pot Clavulanate Swelling  . Tetanus Toxoids Palpitations    ROS Review of Systems    Objective:    Physical Exam Constitutional:      Appearance: She is well-developed and well-nourished.  HENT:     Head: Normocephalic and atraumatic.  Cardiovascular:     Rate and Rhythm: Normal rate and regular rhythm.     Heart sounds: Normal heart sounds.  Pulmonary:     Effort: Pulmonary effort is normal.     Breath sounds: Normal breath sounds.  Skin:    General: Skin is warm and dry.  Neurological:     Mental Status: She is alert and oriented to person, place, and time.  Psychiatric:        Mood and Affect: Mood and affect normal.        Behavior: Behavior normal.     BP 128/68   Pulse 96   Wt 149 lb (67.6 kg)   SpO2 100%   BMI 29.10 kg/m  Wt Readings from Last 3 Encounters:  07/31/20 149 lb (67.6 kg)  04/30/20 147 lb (66.7 kg)  12/29/19 150 lb (68 kg)     There are no preventive care reminders to display for this patient.  There are no preventive care reminders to display for this patient.  Lab Results  Component Value Date   TSH 5.26 (H) 05/29/2020   No results found for: WBC, HGB, HCT, MCV, PLT Lab Results  Component Value Date   NA 140 04/04/2020   K 4.4 04/04/2020   CO2 26 04/04/2020   GLUCOSE 165 (H) 04/04/2020   BUN 22 04/04/2020   CREATININE 1.06 (H) 04/04/2020   BILITOT 1.5 (H) 05/04/2019   ALKPHOS 69 09/24/2016   AST 16 05/04/2019   ALT 10 05/04/2019   PROT 6.5 05/04/2019   ALBUMIN 4.3 09/24/2016   CALCIUM 9.3 04/04/2020   Lab Results  Component Value Date   CHOL 108 05/29/2020   Lab Results  Component Value Date   HDL 36 (L) 05/29/2020   Lab Results  Component Value Date   LDLCALC 50 05/29/2020   Lab Results  Component Value Date   TRIG 136 05/29/2020   Lab Results  Component Value Date   CHOLHDL 3.0 05/29/2020   Lab Results   Component Value Date   HGBA1C 6.8 07/31/2020      Assessment & Plan:   Problem List Items Addressed This Visit  Cardiovascular and Mediastinum   Essential hypertension    Well controlled. Continue current regimen. Follow up in  3-4 mo        Endocrine   Hypothyroidism    Due to recheck level.  Asked her to go to lab today.       Controlled type 2 diabetes mellitus with microalbuminuria, with long-term current use of insulin ([facility]) - Primary    A1C at 6.8, looks great today.  She is really done a great job in bringing her A1c back down under 7 which is fantastic.  There was some confusion of her medications today it looks like the Vania Rea was taken off of her list back in June and based on refills she would have run out in the spring but she says she still been taking it.  Working to discontinue Janumet and add the metformin component to the [place] since she is already on Trulicity.  The Trulicity and Januvia are a bit of a duplication.  So we did update her medication list as well as her prescriptions.      Relevant Medications   Empagliflozin-metFORMIN HCl ER 04-999 MG TB24   Other Relevant Orders   POCT glycosylated hemoglobin (Hb A1C) (Completed)      Meds ordered this encounter  Medications  . Empagliflozin-metFORMIN HCl ER 04-999 MG TB24    Sig: Take 1 tablet by mouth daily.    Dispense:  30 tablet    Refill:  5    Please discontinue Janumet.    Follow-up: Return in about 3 months (around 10/29/2020) for Diabetes follow-up.    [name], MD

## 2020-07-31 NOTE — Assessment & Plan Note (Signed)
A1C at 6.8, looks great today.  She is really done a great job in bringing her A1c back down under 7 which is fantastic.  There was some confusion of her medications today it looks like the Vania Rea was taken off of her list back in June and based on refills she would have run out in the spring but she says she still been taking it.  Working to discontinue Janumet and add the metformin component to the [place] since she is already on Trulicity.  The Trulicity and Januvia are a bit of a duplication.  So we did update her medication list as well as her prescriptions.

## 2020-07-31 NOTE — Assessment & Plan Note (Signed)
Due to recheck level.  Asked her to go to lab today.

## 2020-07-31 NOTE — Assessment & Plan Note (Signed)
Well controlled. Continue current regimen. Follow up in  3-4 mo  

## 2020-09-05 LAB — FECAL OCCULT BLOOD, GUAIAC: Fecal Occult Blood: NEGATIVE

## 2020-09-08 ENCOUNTER — Other Ambulatory Visit: Payer: Self-pay | Admitting: Family Medicine

## 2020-09-11 ENCOUNTER — Ambulatory Visit (INDEPENDENT_AMBULATORY_CARE_PROVIDER_SITE_OTHER): Payer: Medicare Other | Admitting: Family Medicine

## 2020-09-11 DIAGNOSIS — Z0000 Encounter for general adult medical examination without abnormal findings: Secondary | ICD-10-CM

## 2020-09-11 LAB — HM MAMMOGRAPHY

## 2020-09-11 NOTE — Patient Instructions (Addendum)
Diabetes Mellitus and Foot Care Foot care is an important part of your health, especially when you have diabetes. Diabetes may cause you to have problems because of poor blood flow (circulation) to your feet and legs, which can cause your skin to:  Become thinner and drier.  Break more easily.  Heal more slowly.  Peel and crack. You may also have nerve damage (neuropathy) in your legs and feet, causing decreased feeling in them. This means that you may not notice minor injuries to your feet that could lead to more serious problems. Noticing and addressing any potential problems early is the best way to prevent future foot problems. How to care for your feet Foot hygiene  Wash your feet daily with warm water and mild soap. Do not use hot water. Then, pat your feet and the areas between your toes until they are completely dry. Do not soak your feet as this can dry your skin.  Trim your toenails straight across. Do not dig under them or around the cuticle. File the edges of your nails with an emery board or nail file.  Apply a moisturizing lotion or petroleum jelly to the skin on your feet and to dry, brittle toenails. Use lotion that does not contain alcohol and is unscented. Do not apply lotion between your toes.   Shoes and socks  Wear clean socks or stockings every day. Make sure they are not too tight. Do not wear knee-high stockings since they may decrease blood flow to your legs.  Wear shoes that fit properly and have enough cushioning. Always look in your shoes before you put them on to be sure there are no objects inside.  To break in new shoes, wear them for just a few hours a day. This prevents injuries on your feet. Wounds, scrapes, corns, and calluses  Check your feet daily for blisters, cuts, bruises, sores, and redness. If you cannot see the bottom of your feet, use a mirror or ask someone for help.  Do not cut corns or calluses or try to remove them with medicine.  If  you find a minor scrape, cut, or break in the skin on your feet, keep it and the skin around it clean and dry. You may clean these areas with mild soap and water. Do not clean the area with peroxide, alcohol, or iodine.  If you have a wound, scrape, corn, or callus on your foot, look at it several times a day to make sure it is healing and not infected. Check for: ? Redness, swelling, or pain. ? Fluid or blood. ? Warmth. ? Pus or a bad smell.   General tips  Do not cross your legs. This may decrease blood flow to your feet.  Do not use heating pads or hot water bottles on your feet. They may burn your skin. If you have lost feeling in your feet or legs, you may not know this is happening until it is too late.  Protect your feet from hot and cold by wearing shoes, such as at the beach or on hot pavement.  Schedule a complete foot exam at least once a year (annually) or more often if you have foot problems. Report any cuts, sores, or bruises to your health care provider immediately. Where to find more information  American Diabetes Association: www.diabetes.org  Association of Diabetes Care & Education Specialists: www.diabeteseducator.org Contact a health care provider if:  You have a medical condition that increases your risk of infection  and you have any cuts, sores, or bruises on your feet.  You have an injury that is not healing.  You have redness on your legs or feet.  You feel burning or tingling in your legs or feet.  You have pain or cramps in your legs and feet.  Your legs or feet are numb.  Your feet always feel cold.  You have pain around any toenails. Get help right away if:  You have a wound, scrape, corn, or callus on your foot and: ? You have pain, swelling, or redness that gets worse. ? You have fluid or blood coming from the wound, scrape, corn, or callus. ? Your wound, scrape, corn, or callus feels warm to the touch. ? You have pus or a bad smell coming  from the wound, scrape, corn, or callus. ? You have a fever. ? You have a red line going up your leg. Summary  Check your feet every day for blisters, cuts, bruises, sores, and redness.  Apply a moisturizing lotion or petroleum jelly to the skin on your feet and to dry, brittle toenails.  Wear shoes that fit properly and have enough cushioning.  If you have foot problems, report any cuts, sores, or bruises to your health care provider immediately.  Schedule a complete foot exam at least once a year (annually) or more often if you have foot problems. This information is not intended to replace advice given to you by your health care provider. Make sure you discuss any questions you have with your health care provider. Document Revised: 01/25/2020 Document Reviewed: 01/25/2020 Elsevier Patient Education  [place] Maintenance, Female Adopting a healthy lifestyle and getting preventive care are important in promoting health and wellness. Ask your health care provider about:  The right schedule for you to have regular tests and exams.  Things you can do on your own to prevent diseases and keep yourself healthy. What should I know about diet, weight, and exercise? Eat a healthy diet  Eat a diet that includes plenty of vegetables, fruits, low-fat dairy products, and lean protein.  Do not eat a lot of foods that are high in solid fats, added sugars, or sodium.   Maintain a healthy weight Body mass index (BMI) is used to identify weight problems. It estimates body fat based on height and weight. Your health care provider can help determine your BMI and help you achieve or maintain a healthy weight. Get regular exercise Get regular exercise. This is one of the most important things you can do for your health. Most adults should:  Exercise for at least 150 minutes each week. The exercise should increase your heart rate and make you sweat (moderate-intensity  exercise).  Do strengthening exercises at least twice a week. This is in addition to the moderate-intensity exercise.  Spend less time sitting. Even light physical activity can be beneficial. Watch cholesterol and blood lipids Have your blood tested for lipids and cholesterol at 77 years of age, then have this test every 5 years. Have your cholesterol levels checked more often if:  Your lipid or cholesterol levels are high.  You are older than 77 years of age.  You are at high risk for heart disease. What should I know about cancer screening? Depending on your health history and family history, you may need to have cancer screening at various ages. This may include screening for:  Breast cancer.  Cervical cancer.  Colorectal cancer.  Skin cancer.  Lung cancer. What should I know about heart disease, diabetes, and high blood pressure? Blood pressure and heart disease  High blood pressure causes heart disease and increases the risk of stroke. This is more likely to develop in people who have high blood pressure readings, are of African descent, or are overweight.  Have your blood pressure checked: ? Every 3-5 years if you are 63-108 years of age. ? Every year if you are 57 years old or older. Diabetes Have regular diabetes screenings. This checks your fasting blood sugar level. Have the screening done:  Once every three years after age 66 if you are at a normal weight and have a low risk for diabetes.  More often and at a younger age if you are overweight or have a high risk for diabetes. What should I know about preventing infection? Hepatitis B If you have a higher risk for hepatitis B, you should be screened for this virus. Talk with your health care provider to find out if you are at risk for hepatitis B infection. Hepatitis C Testing is recommended for:  Everyone born from [birth year] through 1967.  Anyone with known risk factors for hepatitis C. Sexually transmitted  infections (STIs)  Get screened for STIs, including gonorrhea and chlamydia, if: ? You are sexually active and are younger than 77 years of age. ? You are older than 77 years of age and your health care provider tells you that you are at risk for this type of infection. ? Your sexual activity has changed since you were last screened, and you are at increased risk for chlamydia or gonorrhea. Ask your health care provider if you are at risk.  Ask your health care provider about whether you are at high risk for HIV. Your health care provider may recommend a prescription medicine to help prevent HIV infection. If you choose to take medicine to prevent HIV, you should first get tested for HIV. You should then be tested every 3 months for as long as you are taking the medicine. Pregnancy  If you are about to stop having your period (premenopausal) and you may become pregnant, seek counseling before you get pregnant.  Take 400 to 800 micrograms (mcg) of folic acid every day if you become pregnant.  Ask for birth control (contraception) if you want to prevent pregnancy. Osteoporosis and menopause Osteoporosis is a disease in which the bones lose minerals and strength with aging. This can result in bone fractures. If you are 33 years old or older, or if you are at risk for osteoporosis and fractures, ask your health care provider if you should:  Be screened for bone loss.  Take a calcium or vitamin D supplement to lower your risk of fractures.  Be given hormone replacement therapy (HRT) to treat symptoms of menopause. Follow these instructions at home: Lifestyle  Do not use any products that contain nicotine or tobacco, such as cigarettes, e-cigarettes, and chewing tobacco. If you need help quitting, ask your health care provider.  Do not use street drugs.  Do not share needles.  Ask your health care provider for help if you need support or information about quitting drugs. Alcohol use  Do  not drink alcohol if: ? Your health care provider tells you not to drink. ? You are pregnant, may be pregnant, or are planning to become pregnant.  If you drink alcohol: ? Limit how much you use to 0-1 drink a day. ? Limit intake if you are breastfeeding.  Be aware of how much alcohol is in your drink. In the [place] one drink equals one 12 oz bottle of beer (355 mL), one 5 oz glass of wine (148 mL), or one 1 oz glass of hard liquor (44 mL). General instructions  Schedule regular health, dental, and eye exams.  Stay current with your vaccines.  Tell your health care provider if: ? You often feel depressed. ? You have ever been abused or do not feel safe at home. Summary  Adopting a healthy lifestyle and getting preventive care are important in promoting health and wellness.  Follow your health care provider's instructions about healthy diet, exercising, and getting tested or screened for diseases.  Follow your health care provider's instructions on monitoring your cholesterol and blood pressure. This information is not intended to replace advice given to you by your health care provider. Make sure you discuss any questions you have with your health care provider. Document Revised: 06/29/2018 Document Reviewed: 06/29/2018 Elsevier Patient Education  2021 [place] Maintenance Summary and Written Plan of Care  Yolanda Fields ,  Thank you for allowing me to perform your Medicare Annual Wellness Visit and for your ongoing commitment to your health.   Health Maintenance & Immunization History Health Maintenance  Topic Date Due  . COVID-19 Vaccine (1) 12/28/2020 (Originally 11/20/1950)  . OPHTHALMOLOGY EXAM  10/22/2020  . FOOT EXAM  12/28/2020  . HEMOGLOBIN A1C  01/28/2021  . DEXA SCAN  10/07/2026  . INFLUENZA VACCINE  Completed  . Hepatitis C Screening  Completed  . PNA vac Low Risk Adult  Completed   Immunization History   Administered Date(s) Administered  . Fluad Quad(high Dose 65+) 05/04/2019, 04/30/2020  . Influenza, High Dose Seasonal PF 04/13/2017, 05/24/2018  . Influenza, Seasonal, Injecte, Preservative Fre 06/10/2011, 04/21/2012  . Influenza,inj,Quad PF,6+ Mos 04/20/2013, 03/27/2016  . Influenza-Unspecified 04/29/2015  . Pneumococcal Conjugate-13 07/23/2014, 12/26/2015, 04/20/2016  . Pneumococcal Polysaccharide-23 11/28/2009  . Zoster 04/25/2012    These are the patient goals that we discussed: Goals Addressed              This Visit's Progress   .  Patient Stated (pt-stated)        09/11/2020 AWV Goal: Diabetes Management  . Patient will maintain an A1C level below 8.0 . Patient will not develop any diabetic foot complications . Patient will not experience any hypoglycemic episodes over the next 3 months . Patient will notify our office of any CBG readings outside of the provider recommended range by calling [phone] . Patient will adhere to provider recommendations for diabetes management  Patient Self Management Activities . take all medications as prescribed and report any negative side effects . monitor and record blood sugar readings as directed . adhere to a low carbohydrate diet that incorporates lean proteins, vegetables, whole grains, low glycemic fruits . check feet daily noting any sores, cracks, injuries, or callous formations . see PCP or podiatrist if she notices any changes in her legs, feet, or toenails . Patient will visit PCP and have an A1C level checked every 3 to 6 months as directed  . have a yearly eye exam to monitor for vascular changes associated with diabetes and will request that the report be sent to her pcp.  . consult with her PCP regarding any changes in her health or new or worsening symptoms         This is a list of Health Maintenance Items  that are overdue or due now: Shingles vaccine and Covid vaccine  Orders/Referrals Placed Today: No  orders of the defined types were placed in this encounter.   Follow-up Plan Follow-up with Yolanda Marry, MD as planned

## 2020-09-11 NOTE — Progress Notes (Signed)
MEDICARE ANNUAL WELLNESS VISIT  09/11/2020  Telephone Visit Disclaimer This Medicare AWV was conducted by telephone due to national recommendations for restrictions regarding the COVID-19 Pandemic (e.g. social distancing).  I verified, using two identifiers, that I am speaking with Yolanda Fields or their authorized healthcare agent. I discussed the limitations, risks, security, and privacy concerns of performing an evaluation and management service by telephone and the potential availability of an in-person appointment in the future. The patient expressed understanding and agreed to proceed.  Location of Patient: Home Location of Provider (nurse):  In the office.  Subjective:    Yolanda Fields is a 75 y.o. female patient of Metheney, Rene Kocher, MD who had a Medicare Annual Wellness Visit today via telephone. Yolanda Fields is Retired and lives alone. she has 4 children. she reports that she is socially active and does interact with friends/family regularly. she is moderately physically active and enjoys watching basketball.  Patient Care Team: [name], MD as PCP - General (Family Medicine)  Advanced Directives 09/11/2020 09/11/2019 09/07/2018 09/24/2016  Does Patient Have a Medical Advance Directive? No No No No  Would patient like information on creating a medical advance directive? No - Patient declined Yes (MAU/Ambulatory/Procedural Areas - Information given) No - Patient declined No - Patient declined    Hospital Utilization Over the Past 12 Months: # of hospitalizations or ER visits: 1 # of surgeries: 0  Review of Systems    Patient reports that her overall health is unchanged compared to last year.  History obtained from chart review and the patient  Patient Reported Readings (BP, Pulse, CBG, Weight, etc) none  Pain Assessment Pain : No/denies pain     Current Medications & Allergies (verified) Allergies as of 09/11/2020      Reactions   Tetanus Toxoids Palpitations    Amoxicillin-pot Clavulanate Swelling      Medication List       Accurate as of September 11, 2020  8:40 AM. If you have any questions, ask your nurse or doctor.        albuterol 108 (90 Base) MCG/ACT inhaler Commonly known as: VENTOLIN HFA Inhale into the lungs.   AMBULATORY NON FORMULARY MEDICATION Medication Name: glucometer and strips.  Whatever her insrance will cover.  Dx E11.9Test daily.   amLODipine 10 MG tablet Commonly known as: NORVASC TAKE ONE TABLET BY MOUTH EVERY DAY   atorvastatin 10 MG tablet Commonly known as: LIPITOR TAKE ONE TABLET BY MOUTH AT BEDTIME   Cholecalciferol 50 MCG (2000 UT) Caps Take by mouth.   Cinnamon Bark Powd by Does not apply route.   cyanocobalamin 1000 MCG tablet Take 1,000 mcg by mouth daily.   Synjardy XR 04-999 MG Tb24 Generic drug: Empagliflozin-metFORMIN HCl ER Take by mouth.   Empagliflozin-metFORMIN HCl ER 04-999 MG Tb24 Take 1 tablet by mouth daily.   esomeprazole 40 MG capsule Commonly known as: NEXIUM TAKE ONE CAPSULE BY MOUTH TWO TIMES A DAY BEFORE A MEAL.   furosemide 40 MG tablet Commonly known as: LASIX Take 1 tablet (40 mg total) by mouth daily as needed.   Lancets Misc To be used to check blood sugars up to four (4) times daily. DX: E11.29   Levemir FlexTouch 100 UNIT/ML FlexPen Generic drug: insulin detemir Inject 30 Units into the skin at bedtime.   levothyroxine 100 MCG tablet Commonly known as: SYNTHROID TAKE ONE TABLET BY MOUTH EVERY DAY   losartan 100 MG tablet Commonly known as: COZAAR TAKE 1 TABLET BY  MOUTH ONCE DAILY   metoprolol succinate 25 MG 24 hr tablet Commonly known as: TOPROL-XL Take 25 mg by mouth daily.   multivitamin tablet Take 1 tablet by mouth daily.   Travoprost (BAK Free) 0.004 % Soln ophthalmic solution Commonly known as: TRAVATAN SMARTSIG:In Eye(s)   True Metrix Blood Glucose Test test strip Generic drug: glucose blood TEST DAILY   TRUEplus Pen Needles 32G X  4 MM Misc Generic drug: Insulin Pen Needle USE AS DIRECTED   Trulicity 1.5 JT/7.0VX Sopn Generic drug: Dulaglutide INJECT 1.5MG  INTO THE SKIN ONCE A WEEK       History (reviewed): Past Medical History:  Diagnosis Date  . Diabetes mellitus without complication ([place])   . Hyperlipidemia   . Hypertension    Past Surgical History:  Procedure Laterality Date  . CESAREAN SECTION     Family History  Problem Relation Age of Onset  . Bladder Cancer Brother   . Cancer Sister   . Heart failure Mother 66  . Diabetes Mother   . Heart failure Brother   . Diabetes Brother   . Pancreatic cancer Brother   . Diabetes Sister   . Coronary artery disease Father 75  . Heart attack Father    Social History   Socioeconomic History  . Marital status: Divorced    Spouse name: Not on file  . Number of children: 4  . Years of education: HS  . Highest education level: 12th grade  Occupational History  . Occupation: Retired from [employer]   Tobacco Use  . Smoking status: Never Smoker  . Smokeless tobacco: Never Used  Vaping Use  . Vaping Use: Never used  Substance and Sexual Activity  . Alcohol use: No    Alcohol/week: 0.0 standard drinks  . Drug use: No  . Sexual activity: Not Currently  Other Topics Concern  . Not on file  Social History Narrative   Patient is retired from [employer].  consumes 2 caffeine drinks daily and rates her diet as good. Walks 2-3 times a week for 20 minutes. Enjoys watching basketball. She use to enjoy riding her bicycle but she hasn't been able to lately.        Social Determinants of Health   Financial Resource Strain: Low Risk   . Difficulty of Paying Living Expenses: Not hard at all  Food Insecurity: No Food Insecurity  . Worried About Charity fundraiser in the Last Year: Never true  . Ran Out of Food in the Last Year: Never true  Transportation Needs: No Transportation Needs  . Lack of Transportation (Medical): No  . Lack of Transportation (Non-Medical):  No  Physical Activity: Insufficiently Active  . Days of Exercise per Week: 3 days  . Minutes of Exercise per Session: 20 min  Stress: No Stress Concern Present  . Feeling of Stress : Not at all  Social Connections: Socially Isolated  . Frequency of Communication with Friends and Family: More than three times a week  . Frequency of Social Gatherings with Friends and Family: Three times a week  . Attends Religious Services: Never  . Active Member of Clubs or Organizations: No  . Attends Archivist Meetings: Never  . Marital Status: Divorced    Activities of Daily Living In your present state of health, do you have any difficulty performing the following activities: 09/11/2020  Hearing? Y  Comment uses bilateral hearing aids  Vision? N  Difficulty concentrating or making decisions? N  Walking or climbing stairs?  N  Dressing or bathing? N  Doing errands, shopping? N  Preparing Food and eating ? N  Using the Toilet? N  In the past six months, have you accidently leaked urine? N  Do you have problems with loss of bowel control? N  Managing your Medications? N  Managing your Finances? N  Housekeeping or managing your Housekeeping? N  Some recent data might be hidden    Patient Education/ Literacy How often do you need to have someone help you when you read instructions, pamphlets, or other written materials from your doctor or pharmacy?: 1 - Never What is the last grade level you completed in school?: 12th grade  Exercise Current Exercise Habits: Home exercise routine, Type of exercise: walking, Time (Minutes): 20, Frequency (Times/Week): 3, Weekly Exercise (Minutes/Week): 60, Intensity: Moderate, Exercise limited by: None identified  Diet Patient reports consuming 2 meals a day and 1 snack(s) a day Patient reports that her primary diet is: Regular Patient reports that she does have regular access to food.   Depression Screen PHQ 2/9 Scores 09/11/2020 04/30/2020  12/29/2019 09/11/2019 05/04/2019 09/07/2018 02/21/2018  PHQ - 2 Score 0 0 0 0 0 0 0     Fall Risk Fall Risk  09/11/2020 04/30/2020 09/11/2019 05/04/2019 09/07/2018  Falls in the past year? 0 0 0 0 0  Number falls in past yr: 0 0 - 0 -  Injury with Fall? 0 0 - 0 -  Risk for fall due to : No Fall Risks No Fall Risks No Fall Risks - -  Follow up Falls evaluation completed Falls evaluation completed Falls prevention discussed - Falls prevention discussed     Objective:  Yolanda Fields seemed alert and oriented and she participated appropriately during our telephone visit.  Blood Pressure Weight BMI  BP Readings from Last 3 Encounters:  07/31/20 128/68  04/30/20 (!) 126/49  12/29/19 132/67   Wt Readings from Last 3 Encounters:  07/31/20 149 lb (67.6 kg)  04/30/20 147 lb (66.7 kg)  12/29/19 150 lb (68 kg)   BMI Readings from Last 1 Encounters:  07/31/20 29.10 kg/m    *Unable to obtain current vital signs, weight, and BMI due to telephone visit type  Hearing/Vision  . Yolanda Fields did not seem to have difficulty with hearing/understanding during the telephone conversation . Reports that she has had a formal eye exam by an eye care professional within the past year . Reports that she has had a formal hearing evaluation within the past year *Unable to fully assess hearing and vision during telephone visit type  Cognitive Function: 6CIT Screen 09/11/2020 09/11/2019 09/07/2018 09/24/2016  What Year? 0 points 0 points 0 points 0 points  What month? 0 points 0 points 0 points 0 points  What time? 0 points 0 points 0 points 0 points  Count back from 20 0 points 0 points 0 points 0 points  Months in reverse 0 points 0 points 0 points 0 points  Repeat phrase 0 points 0 points 2 points 0 points  Total Score 0 0 2 0   (Normal:0-7, Significant for Dysfunction: >8)  Normal Cognitive Function Screening: Yes   Immunization & Health Maintenance Record Immunization History  Administered Date(s) Administered   . Fluad Quad(high Dose 65+) 05/04/2019, 04/30/2020  . Influenza, High Dose Seasonal PF 04/13/2017, 05/24/2018  . Influenza, Seasonal, Injecte, Preservative Fre 06/10/2011, 04/21/2012  . Influenza,inj,Quad PF,6+ Mos 04/20/2013, 03/27/2016  . Influenza-Unspecified 04/29/2015  . Pneumococcal Conjugate-13 07/23/2014, 12/26/2015, 04/20/2016  . Pneumococcal Polysaccharide-23  11/28/2009  . Zoster 04/25/2012    Health Maintenance  Topic Date Due  . COVID-19 Vaccine (1) 12/28/2020 (Originally 11/20/1950)  . OPHTHALMOLOGY EXAM  10/22/2020  . FOOT EXAM  12/28/2020  . HEMOGLOBIN A1C  01/28/2021  . DEXA SCAN  10/07/2026  . INFLUENZA VACCINE  Completed  . Hepatitis C Screening  Completed  . PNA vac Low Risk Adult  Completed       Assessment  This is a routine wellness examination for Yolanda Fields.  Health Maintenance: Due or Overdue There are no preventive care reminders to display for this patient.  Yolanda Fields does not need a referral for Community Assistance: Care Management:   no Social Work:    no Prescription Assistance:  no Nutrition/Diabetes Education:  no   Plan:  Personalized Goals Goals Addressed              This Visit's Progress   .  Patient Stated (pt-stated)        09/11/2020 AWV Goal: Diabetes Management  . Patient will maintain an A1C level below 8.0 . Patient will not develop any diabetic foot complications . Patient will not experience any hypoglycemic episodes over the next 3 months . Patient will notify our office of any CBG readings outside of the provider recommended range by calling [phone] . Patient will adhere to provider recommendations for diabetes management  Patient Self Management Activities . take all medications as prescribed and report any negative side effects . monitor and record blood sugar readings as directed . adhere to a low carbohydrate diet that incorporates lean proteins, vegetables, whole grains, low glycemic fruits . check  feet daily noting any sores, cracks, injuries, or callous formations . see PCP or podiatrist if she notices any changes in her legs, feet, or toenails . Patient will visit PCP and have an A1C level checked every 3 to 6 months as directed  . have a yearly eye exam to monitor for vascular changes associated with diabetes and will request that the report be sent to her pcp.  . consult with her PCP regarding any changes in her health or new or worsening symptoms       Personalized Health Maintenance & Screening Recommendations  Shingles vaccine and Covid vaccine  Lung Cancer Screening Recommended: no (Low Dose CT Chest recommended if Age 47-80 years, 30 pack-year currently smoking OR have quit w/in past 15 years) Hepatitis C Screening recommended: no HIV Screening recommended: no  Advanced Directives: Written information was not prepared per patient's request.  Referrals & Orders No orders of the defined types were placed in this encounter.   Follow-up Plan . Follow-up with [name], MD as planned    I have personally reviewed and noted the following in the patient's chart:   . Medical and social history . Use of alcohol, tobacco or illicit drugs  . Current medications and supplements . Functional ability and status . Nutritional status . Physical activity . Advanced directives . List of other physicians . Hospitalizations, surgeries, and ER visits in previous 12 months . Vitals . Screenings to include cognitive, depression, and falls . Referrals and appointments  In addition, I have reviewed and discussed with Yolanda Fields certain preventive protocols, quality metrics, and best practice recommendations. A written personalized care plan for preventive services as well as general preventive health recommendations is available and can be mailed to the patient at her request.      Yolanda Gens, RN  09/11/2020

## 2020-09-26 ENCOUNTER — Encounter: Payer: Self-pay | Admitting: Family Medicine

## 2020-10-16 ENCOUNTER — Encounter: Payer: Self-pay | Admitting: Family Medicine

## 2020-11-05 ENCOUNTER — Other Ambulatory Visit: Payer: Self-pay

## 2020-11-05 ENCOUNTER — Telehealth: Payer: Self-pay | Admitting: Family Medicine

## 2020-11-05 ENCOUNTER — Ambulatory Visit (INDEPENDENT_AMBULATORY_CARE_PROVIDER_SITE_OTHER): Payer: Medicare Other | Admitting: Family Medicine

## 2020-11-05 ENCOUNTER — Encounter: Payer: Self-pay | Admitting: Family Medicine

## 2020-11-05 VITALS — BP 130/42 | HR 87 | Ht 60.0 in | Wt 148.0 lb

## 2020-11-05 DIAGNOSIS — N1832 Chronic kidney disease, stage 3b: Secondary | ICD-10-CM | POA: Diagnosis not present

## 2020-11-05 DIAGNOSIS — E1129 Type 2 diabetes mellitus with other diabetic kidney complication: Secondary | ICD-10-CM

## 2020-11-05 DIAGNOSIS — I10 Essential (primary) hypertension: Secondary | ICD-10-CM

## 2020-11-05 DIAGNOSIS — R809 Proteinuria, unspecified: Secondary | ICD-10-CM

## 2020-11-05 DIAGNOSIS — Z794 Long term (current) use of insulin: Secondary | ICD-10-CM

## 2020-11-05 DIAGNOSIS — I77811 Abdominal aortic ectasia: Secondary | ICD-10-CM

## 2020-11-05 DIAGNOSIS — E559 Vitamin D deficiency, unspecified: Secondary | ICD-10-CM | POA: Diagnosis not present

## 2020-11-05 DIAGNOSIS — I77819 Aortic ectasia, unspecified site: Secondary | ICD-10-CM

## 2020-11-05 LAB — POCT GLYCOSYLATED HEMOGLOBIN (HGB A1C): Hemoglobin A1C: 7.5 % — AB (ref 4.0–5.6)

## 2020-11-05 MED ORDER — SYNJARDY XR 25-1000 MG PO TB24: 1.0000 | ORAL_TABLET | Freq: Every day | ORAL | 1 refills | Status: DC

## 2020-11-05 NOTE — Patient Instructions (Signed)
Please decrease your Levemir to 28 units each day.

## 2020-11-05 NOTE — Assessment & Plan Note (Signed)
Well controlled. Continue current regimen. Follow up in  3-4 mo  

## 2020-11-05 NOTE — Telephone Encounter (Signed)
Please call patient and let her know that [facility] get her scheduled for an ultrasound.  About 6 years ago she had an ultrasound of her aorta which showed a slightly widened area which over time can turn into an aneurysm.  She is due to repeat that Doppler to make sure that it has not expanded over time.  So we will work on getting her scheduled for that.  I just wanted her be aware in case she got a phone call

## 2020-11-05 NOTE — Telephone Encounter (Signed)
Pt.notified

## 2020-11-05 NOTE — Assessment & Plan Note (Addendum)
Fortunately her A1c went up but we have made some changes to her regimen because she was actually taking some medications that were duplicative.  Discussed increasing the Jardiance component of her Synjardy.  Plan to follow-up in 3 months.  Continue to work on healthy diet and regular exercise.  Please decrease your Levemir to 28 units each day.

## 2020-11-05 NOTE — Assessment & Plan Note (Signed)
Plan to recheck today.  If we increase her Synjardy to 25 mg we will definitely need to keep a close eye on her renal function plan to recheck in 3 months next time.

## 2020-11-05 NOTE — Assessment & Plan Note (Signed)
Will schedule repeat Doppler.    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 Ectatic abdominal aorta at risk for aneurysm development. Recommend followup by ultrasound in 5 years. This recommendation follows ACR consensus guidelines: White Paper of the ACR Incidental Findings Committee II on Vascular Findings. J Am Coll Radiol 2013; 10:789-794.   Electronically Signed   By: Marcello Moores  Register   On: 05/10/2015 10:00

## 2020-11-05 NOTE — Progress Notes (Signed)
Established Patient Office Visit  Subjective:  Patient ID: Yolanda Fields, female    DOB: 06-07-1946  Age: 75 y.o. MRN: [id number]  CC:  Chief Complaint  Patient presents with  . Diabetes    HPI Yolanda Fields presents for   Hypertension- Pt denies chest pain, SOB, dizziness, or heart palpitations.  Taking meds as directed w/o problems.  Denies medication side effects.    Diabetes -she has been having some blood sugars in the 70s in the mornings fairly frequently she said she even had one in the 60s.  Most of the time though they are under 100.  He is currently using 30 units of Levemir.  Has been drinking lots of water and trying to stay hydrated.  No wounds or sores that are not healing well. No increased thirst or urination. Checking glucose at home. Taking medications as prescribed without any side effects. She says she has been trying really hard at her diet.   F/U CKD 3 - no recent changes.   Past Medical History:  Diagnosis Date  . Diabetes mellitus without complication ([place])   . Hyperlipidemia   . Hypertension     Past Surgical History:  Procedure Laterality Date  . CESAREAN SECTION      Family History  Problem Relation Age of Onset  . Bladder Cancer Brother   . Cancer Sister   . Heart failure Mother 75  . Diabetes Mother   . Heart failure Brother   . Diabetes Brother   . Pancreatic cancer Brother   . Diabetes Sister   . Coronary artery disease Father 42  . Heart attack Father     Social History   Socioeconomic History  . Marital status: Divorced    Spouse name: Not on file  . Number of children: 4  . Years of education: HS  . Highest education level: 12th grade  Occupational History  . Occupation: Retired from [employer]   Tobacco Use  . Smoking status: Never Smoker  . Smokeless tobacco: Never Used  Vaping Use  . Vaping Use: Never used  Substance and Sexual Activity  . Alcohol use: No    Alcohol/week: 0.0 standard drinks  . Drug use: No  . Sexual  activity: Not Currently  Other Topics Concern  . Not on file  Social History Narrative   Patient is retired from [employer].  consumes 2 caffeine drinks daily and rates her diet as good. Walks 2-3 times a week for 20 minutes. Enjoys watching basketball. She use to enjoy riding her bicycle but she hasn't been able to lately.        Social Determinants of Health   Financial Resource Strain: Low Risk   . Difficulty of Paying Living Expenses: Not hard at all  Food Insecurity: No Food Insecurity  . Worried About Charity fundraiser in the Last Year: Never true  . Ran Out of Food in the Last Year: Never true  Transportation Needs: No Transportation Needs  . Lack of Transportation (Medical): No  . Lack of Transportation (Non-Medical): No  Physical Activity: Insufficiently Active  . Days of Exercise per Week: 3 days  . Minutes of Exercise per Session: 20 min  Stress: No Stress Concern Present  . Feeling of Stress : Not at all  Social Connections: Socially Isolated  . Frequency of Communication with Friends and Family: More than three times a week  . Frequency of Social Gatherings with Friends and Family: Three times a week  . Attends  Religious Services: Never  . Active Member of Clubs or Organizations: No  . Attends Archivist Meetings: Never  . Marital Status: Divorced  Human resources officer Violence: Not At Risk  . Fear of Current or Ex-Partner: No  . Emotionally Abused: No  . Physically Abused: No  . Sexually Abused: No    Outpatient Medications Prior to Visit  Medication Sig Dispense Refill  . albuterol (VENTOLIN HFA) 108 (90 Base) MCG/ACT inhaler Inhale into the lungs.    . AMBULATORY NON FORMULARY MEDICATION Medication Name: glucometer and strips.  Whatever her insrance will cover.  Dx E11.9Test daily. 1 Units 0  . amLODipine (NORVASC) 10 MG tablet TAKE ONE TABLET BY MOUTH EVERY DAY 90 tablet 1  . atorvastatin (LIPITOR) 10 MG tablet TAKE ONE TABLET BY MOUTH AT BEDTIME 90 tablet  3  . Cholecalciferol 2000 UNITS CAPS Take by mouth.    Wallace Cullens POWD by Does not apply route.    . cyanocobalamin 1000 MCG tablet Take 1,000 mcg by mouth daily.    Marland Kitchen esomeprazole (NEXIUM) 40 MG capsule TAKE ONE CAPSULE BY MOUTH TWO TIMES A DAY BEFORE A MEAL. 180 capsule 3  . furosemide (LASIX) 40 MG tablet Take 1 tablet (40 mg total) by mouth daily as needed. 90 tablet 1  . Lancets MISC To be used to check blood sugars up to four (4) times daily. DX: E11.29 200 each 11  . LEVEMIR FLEXTOUCH 100 UNIT/ML FlexPen Inject 30 Units into the skin at bedtime. 45 mL 3  . levothyroxine (SYNTHROID) 100 MCG tablet TAKE ONE TABLET BY MOUTH EVERY DAY 90 tablet 2  . losartan (COZAAR) 100 MG tablet TAKE 1 TABLET BY MOUTH ONCE DAILY 90 tablet 1  . metoprolol succinate (TOPROL-XL) 25 MG 24 hr tablet Take 25 mg by mouth daily.    . Multiple Vitamin (MULTIVITAMIN) tablet Take 1 tablet by mouth daily.    . Travoprost, BAK Free, (TRAVATAN) 0.004 % SOLN ophthalmic solution SMARTSIG:In Eye(s)    . TRUE METRIX BLOOD GLUCOSE TEST test strip TEST DAILY 100 each 11  . TRUEPLUS PEN NEEDLES 32G X 4 MM MISC USE AS DIRECTED 100 each 3  . TRULICITY 1.5 SP/2.3RA SOPN INJECT 1.5MG  INTO THE SKIN ONCE A WEEK 6 mL 3  . Empagliflozin-metFORMIN HCl ER (SYNJARDY XR) 04-999 MG TB24 Take by mouth.    . Empagliflozin-metFORMIN HCl ER 04-999 MG TB24 Take 1 tablet by mouth daily. 30 tablet 5   No facility-administered medications prior to visit.    Allergies  Allergen Reactions  . Tetanus Toxoids Palpitations  . Amoxicillin-Pot Clavulanate Swelling    ROS Review of Systems    Objective:    Physical Exam Constitutional:      Appearance: She is well-developed.  HENT:     Head: Normocephalic and atraumatic.  Cardiovascular:     Rate and Rhythm: Normal rate and regular rhythm.     Heart sounds: Normal heart sounds.  Pulmonary:     Effort: Pulmonary effort is normal.     Breath sounds: Normal breath sounds.   Skin:    General: Skin is warm and dry.  Neurological:     Mental Status: She is alert and oriented to person, place, and time.  Psychiatric:        Behavior: Behavior normal.     BP (!) 130/42   Pulse 87   Ht 5' (1.524 m)   Wt 148 lb (67.1 kg)   SpO2 100%   BMI  28.90 kg/m  Wt Readings from Last 3 Encounters:  11/05/20 148 lb (67.1 kg)  07/31/20 149 lb (67.6 kg)  04/30/20 147 lb (66.7 kg)     Health Maintenance Due  Topic Date Due  . OPHTHALMOLOGY EXAM  10/22/2020    There are no preventive care reminders to display for this patient.  Lab Results  Component Value Date   TSH 3.02 07/31/2020   No results found for: WBC, HGB, HCT, MCV, PLT Lab Results  Component Value Date   NA 140 04/04/2020   K 4.4 04/04/2020   CO2 26 04/04/2020   GLUCOSE 165 (H) 04/04/2020   BUN 22 04/04/2020   CREATININE 1.06 (H) 04/04/2020   BILITOT 1.5 (H) 05/04/2019   ALKPHOS 69 09/24/2016   AST 16 05/04/2019   ALT 10 05/04/2019   PROT 6.5 05/04/2019   ALBUMIN 4.3 09/24/2016   CALCIUM 9.3 04/04/2020   Lab Results  Component Value Date   CHOL 108 05/29/2020   Lab Results  Component Value Date   HDL 36 (L) 05/29/2020   Lab Results  Component Value Date   LDLCALC 50 05/29/2020   Lab Results  Component Value Date   TRIG 136 05/29/2020   Lab Results  Component Value Date   CHOLHDL 3.0 05/29/2020   Lab Results  Component Value Date   HGBA1C 7.5 (A) 11/05/2020      Assessment & Plan:   Problem List Items Addressed This Visit      Cardiovascular and Mediastinum   Essential hypertension    Well controlled. Continue current regimen. Follow up in  3-4 mo      Relevant Orders   COMPLETE METABOLIC PANEL WITH GFR   Aortic ectasia (HCC)    Will schedule repeat Doppler.    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 Ectatic abdominal aorta at risk for aneurysm development. Recommend followup by ultrasound in 5 years. This recommendation follows ACR consensus  guidelines: White Paper of the ACR Incidental Findings Committee II on Vascular Findings. J Am Coll Radiol 2013; 10:789-794.   Electronically Signed   By: Marcello Moores  Register   On: 05/10/2015 10:00      Abdominal aortic ectasia (HCC)   Relevant Orders   VAS [place] AAA DUPLEX     Endocrine   Controlled type 2 diabetes mellitus with microalbuminuria, with long-term current use of insulin ([place]) - Primary    Fortunately her A1c went up but we have made some changes to her regimen because she was actually taking some medications that were duplicative.  Discussed increasing the Jardiance component of her Synjardy.  Plan to follow-up in 3 months.  Continue to work on healthy diet and regular exercise.  Please decrease your Levemir to 28 units each day.      Relevant Medications   Empagliflozin-metFORMIN HCl ER (SYNJARDY XR) 25-1000 MG TB24   Other Relevant Orders   POCT glycosylated hemoglobin (Hb A1C) (Completed)   COMPLETE METABOLIC PANEL WITH GFR     Genitourinary   Chronic kidney disease (CKD) stage G3b/A1, moderately decreased glomerular filtration rate (GFR) between 30-44 mL/min/1.73 square meter and albuminuria creatinine ratio less than 30 mg/g ([place])    Plan to recheck today.  If we increase her Synjardy to 25 mg we will definitely need to keep a close eye on her renal function plan to recheck in 3 months next time.      Relevant Orders   COMPLETE METABOLIC PANEL WITH GFR     Other  Vitamin D deficiency   Relevant Orders   VITAMIN D 25 Hydroxy (Vit-D Deficiency, Fractures)      Meds ordered this encounter  Medications  . Empagliflozin-metFORMIN HCl ER (SYNJARDY XR) 25-1000 MG TB24    Sig: Take 1 tablet by mouth daily.    Dispense:  90 tablet    Refill:  1    Follow-up: Return in about 3 months (around 02/04/2021) for Diabetes follow-up.    [name], MD

## 2020-11-06 LAB — COMPLETE METABOLIC PANEL WITH GFR
AG Ratio: 2.3 (calc) (ref 1.0–2.5)
ALT: 11 U/L (ref 6–29)
AST: 13 U/L (ref 10–35)
Albumin: 4.4 g/dL (ref 3.6–5.1)
Alkaline phosphatase (APISO): 60 U/L (ref 37–153)
BUN/Creatinine Ratio: 26 (calc) — ABNORMAL HIGH (ref 6–22)
BUN: 25 mg/dL (ref 7–25)
CO2: 25 mmol/L (ref 20–32)
Calcium: 9.8 mg/dL (ref 8.6–10.4)
Chloride: 105 mmol/L (ref 98–110)
Creat: 0.96 mg/dL — ABNORMAL HIGH (ref 0.60–0.93)
GFR, Est African American: 67 mL/min/{1.73_m2} (ref >=60)
GFR, Est Non African American: 57 mL/min/{1.73_m2} — ABNORMAL LOW (ref >=60)
Globulin: 1.9 g/dL (calc) (ref 1.9–3.7)
Glucose, Bld: 124 mg/dL — ABNORMAL HIGH (ref 65–99)
Potassium: 4.5 mmol/L (ref 3.5–5.3)
Sodium: 140 mmol/L (ref 135–146)
Total Bilirubin: 1.1 mg/dL (ref 0.2–1.2)
Total Protein: 6.3 g/dL (ref 6.1–8.1)

## 2020-11-06 LAB — VITAMIN D 25 HYDROXY (VIT D DEFICIENCY, FRACTURES): Vit D, 25-Hydroxy: 68 ng/mL (ref 30–100)

## 2020-11-08 ENCOUNTER — Other Ambulatory Visit: Payer: Self-pay | Admitting: *Deleted

## 2020-11-08 DIAGNOSIS — Z8249 Family history of ischemic heart disease and other diseases of the circulatory system: Secondary | ICD-10-CM

## 2020-11-13 ENCOUNTER — Ambulatory Visit (HOSPITAL_BASED_OUTPATIENT_CLINIC_OR_DEPARTMENT_OTHER)
Admission: RE | Admit: 2020-11-13 | Discharge: 2020-11-13 | Disposition: A | Discharge status: Home / Self Care | Payer: Medicare Other | Source: Ambulatory Visit | Attending: Family Medicine | Admitting: Family Medicine

## 2020-11-13 ENCOUNTER — Other Ambulatory Visit: Payer: Self-pay

## 2020-11-13 DIAGNOSIS — I714 Abdominal aortic aneurysm, without rupture: Secondary | ICD-10-CM | POA: Diagnosis not present

## 2020-11-13 DIAGNOSIS — Z8249 Family history of ischemic heart disease and other diseases of the circulatory system: Secondary | ICD-10-CM | POA: Insufficient documentation

## 2020-12-10 ENCOUNTER — Other Ambulatory Visit: Payer: Self-pay | Admitting: Family Medicine

## 2020-12-17 DIAGNOSIS — U071 COVID-19: Secondary | ICD-10-CM | POA: Diagnosis not present

## 2020-12-24 ENCOUNTER — Encounter: Payer: Self-pay | Admitting: Family Medicine

## 2021-01-12 ENCOUNTER — Other Ambulatory Visit: Payer: Self-pay | Admitting: Family Medicine

## 2021-01-16 DIAGNOSIS — U071 COVID-19: Secondary | ICD-10-CM | POA: Diagnosis not present

## 2021-02-04 ENCOUNTER — Other Ambulatory Visit: Payer: Self-pay

## 2021-02-04 ENCOUNTER — Encounter: Payer: Self-pay | Admitting: Family Medicine

## 2021-02-04 ENCOUNTER — Other Ambulatory Visit: Payer: Self-pay | Admitting: *Deleted

## 2021-02-04 ENCOUNTER — Ambulatory Visit (INDEPENDENT_AMBULATORY_CARE_PROVIDER_SITE_OTHER): Payer: Medicare Other | Admitting: Family Medicine

## 2021-02-04 VITALS — BP 143/62 | HR 93 | Ht 60.0 in | Wt 147.0 lb

## 2021-02-04 DIAGNOSIS — E1129 Type 2 diabetes mellitus with other diabetic kidney complication: Secondary | ICD-10-CM

## 2021-02-04 DIAGNOSIS — M549 Dorsalgia, unspecified: Secondary | ICD-10-CM | POA: Diagnosis not present

## 2021-02-04 DIAGNOSIS — E038 Other specified hypothyroidism: Secondary | ICD-10-CM | POA: Diagnosis not present

## 2021-02-04 DIAGNOSIS — L84 Corns and callosities: Secondary | ICD-10-CM

## 2021-02-04 DIAGNOSIS — R809 Proteinuria, unspecified: Secondary | ICD-10-CM

## 2021-02-04 DIAGNOSIS — I10 Essential (primary) hypertension: Secondary | ICD-10-CM | POA: Diagnosis not present

## 2021-02-04 DIAGNOSIS — Z794 Long term (current) use of insulin: Secondary | ICD-10-CM

## 2021-02-04 LAB — BASIC METABOLIC PANEL WITH GFR
BUN/Creatinine Ratio: 23 (calc) — ABNORMAL HIGH (ref 6–22)
BUN: 28 mg/dL — ABNORMAL HIGH (ref 7–25)
CO2: 25 mmol/L (ref 20–32)
Calcium: 9.7 mg/dL (ref 8.6–10.4)
Chloride: 105 mmol/L (ref 98–110)
Creat: 1.23 mg/dL — ABNORMAL HIGH (ref 0.60–1.00)
Glucose, Bld: 144 mg/dL — ABNORMAL HIGH (ref 65–99)
Potassium: 4.4 mmol/L (ref 3.5–5.3)
Sodium: 141 mmol/L (ref 135–146)
eGFR: 45 mL/min/{1.73_m2} — ABNORMAL LOW (ref >=60)

## 2021-02-04 LAB — POCT GLYCOSYLATED HEMOGLOBIN (HGB A1C): Hemoglobin A1C: 7.5 % — AB (ref 4.0–5.6)

## 2021-02-04 LAB — POCT UA - MICROALBUMIN
Albumin/Creatinine Ratio, Urine, POC: <30
Creatinine, POC: 100 mg/dL
Microalbumin Ur, POC: 30 mg/L

## 2021-02-04 LAB — TSH: TSH: 3.88 mIU/L (ref 0.40–4.50)

## 2021-02-04 MED ORDER — AMBULATORY NON FORMULARY MEDICATION: 0 refills | Status: AC

## 2021-02-04 MED ORDER — TRULICITY 3 MG/0.5ML ~~LOC~~ SOAJ: 3.0000 mg | SUBCUTANEOUS | 1 refills | Status: DC

## 2021-02-04 NOTE — Progress Notes (Signed)
Established Patient Office Visit  Subjective:  Patient ID: Lenise Jr, female    DOB: [birth year]  Age: 75 y.o. MRN: [id number]  CC:  Chief Complaint  Patient presents with   Follow-up    3 month follow up DM    HPI Curtisha Bendix presents for   Diabetes - no hypoglycemic events. No wounds or sores that are not healing well. No increased thirst or urination. Checking glucose at home. Taking medications as prescribed without any side effects.  Hypertension- Pt denies chest pain, SOB, dizziness, or heart palpitations.  Taking meds as directed w/o problems.  Denies medication side effects.    She also had some mid- back pain last week and would like to have her kidneys checked.  She says it feels little better this week.  Hypothyroidism - Taking medication regularly in the AM away from food and vitamins, etc. No recent change to skin, hair, or energy levels.  She also has a callus on the bottom of her right foot.  She was previously seeing a podiatrist, Dr. Posey Pronto in [facility] but Dr. Posey Pronto has moved to [facility] and she would like to find a new podiatrist.  Past Medical History:  Diagnosis Date   Diabetes mellitus without complication ([place])    Hyperlipidemia    Hypertension     Past Surgical History:  Procedure Laterality Date   CESAREAN SECTION      Family History  Problem Relation Age of Onset   Bladder Cancer Brother    Cancer Sister    Heart failure Mother 48   Diabetes Mother    Heart failure Brother    Diabetes Brother    Pancreatic cancer Brother    Diabetes Sister    Coronary artery disease Father 40   Heart attack Father     Social History   Socioeconomic History   Marital status: Divorced    Spouse name: Not on file   Number of children: 4   Years of education: HS   Highest education level: 12th grade  Occupational History   Occupation: Retired from [employer]   Tobacco Use   Smoking status: Never   Smokeless tobacco: Never  Vaping Use   Vaping Use:  Never used  Substance and Sexual Activity   Alcohol use: No    Alcohol/week: 0.0 standard drinks   Drug use: No   Sexual activity: Not Currently  Other Topics Concern   Not on file  Social History Narrative   Patient is retired from [employer].  consumes 2 caffeine drinks daily and rates her diet as good. Walks 2-3 times a week for 20 minutes. Enjoys watching basketball. She use to enjoy riding her bicycle but she hasn't been able to lately.        Social Determinants of Health   Financial Resource Strain: Low Risk    Difficulty of Paying Living Expenses: Not hard at all  Food Insecurity: No Food Insecurity   Worried About Charity fundraiser in the Last Year: Never true   [place] in the Last Year: Never true  Transportation Needs: No Transportation Needs   Lack of Transportation (Medical): No   Lack of Transportation (Non-Medical): No  Physical Activity: Insufficiently Active   Days of Exercise per Week: 3 days   Minutes of Exercise per Session: 20 min  Stress: No Stress Concern Present   Feeling of Stress : Not at all  Social Connections: Socially Isolated   Frequency of Communication with Friends  and Family: More than three times a week   Frequency of Social Gatherings with Friends and Family: Three times a week   Attends Religious Services: Never   Active Member of Clubs or Organizations: No   Attends Archivist Meetings: Never   Marital Status: Divorced  Human resources officer Violence: Not At Risk   Fear of Current or Ex-Partner: No   Emotionally Abused: No   Physically Abused: No   Sexually Abused: No    Outpatient Medications Prior to Visit  Medication Sig Dispense Refill   albuterol (VENTOLIN HFA) 108 (90 Base) MCG/ACT inhaler Inhale into the lungs.     AMBULATORY NON FORMULARY MEDICATION Medication Name: glucometer and strips.  Whatever her insrance will cover.  Dx E11.9Test daily. 1 Units 0   amLODipine (NORVASC) 10 MG tablet TAKE ONE TABLET BY MOUTH  EVERY DAY 90 tablet 1   atorvastatin (LIPITOR) 10 MG tablet TAKE ONE TABLET BY MOUTH AT BEDTIME 90 tablet 3   Cholecalciferol 2000 UNITS CAPS Take by mouth.     Cinnamon Bark POWD by Does not apply route.     cyanocobalamin 1000 MCG tablet Take 1,000 mcg by mouth daily.     Empagliflozin-metFORMIN HCl ER (SYNJARDY XR) 25-1000 MG TB24 Take 1 tablet by mouth daily. 90 tablet 1   esomeprazole (NEXIUM) 40 MG capsule TAKE ONE CAPSULE BY MOUTH TWO TIMES A DAY BEFORE A MEAL. 180 capsule 3   furosemide (LASIX) 40 MG tablet Take 1 tablet (40 mg total) by mouth daily as needed. 90 tablet 1   Lancets MISC To be used to check blood sugars up to four (4) times daily. DX: E11.29 200 each 11   LEVEMIR FLEXTOUCH 100 UNIT/ML FlexPen Inject 30 Units into the skin at bedtime. 45 mL 3   levothyroxine (SYNTHROID) 100 MCG tablet TAKE ONE TABLET BY MOUTH EVERY DAY 90 tablet 2   losartan (COZAAR) 100 MG tablet TAKE 1 TABLET BY MOUTH ONCE DAILY 90 tablet 1   metoprolol succinate (TOPROL-XL) 25 MG 24 hr tablet Take 25 mg by mouth daily.     Multiple Vitamin (MULTIVITAMIN) tablet Take 1 tablet by mouth daily.     Travoprost, BAK Free, (TRAVATAN) 0.004 % SOLN ophthalmic solution SMARTSIG:In Eye(s)     TRUE METRIX BLOOD GLUCOSE TEST test strip TEST DAILY 100 each 11   TRUEPLUS PEN NEEDLES 32G X 4 MM MISC USE AS DIRECTED 269 each 3   TRULICITY 1.5 SW/5.4OE SOPN INJECT 1.5MG  INTO THE SKIN ONCE A WEEK 6 mL 3   No facility-administered medications prior to visit.    Allergies  Allergen Reactions   Tetanus Toxoids Palpitations   Amoxicillin-Pot Clavulanate Swelling    ROS Review of Systems    Objective:    Physical Exam Constitutional:      Appearance: Normal appearance. She is well-developed.  HENT:     Head: Normocephalic and atraumatic.  Cardiovascular:     Rate and Rhythm: Normal rate and regular rhythm.     Heart sounds: Normal heart sounds.  Pulmonary:     Effort: Pulmonary effort is normal.      Breath sounds: Normal breath sounds.  Skin:    General: Skin is warm and dry.  Neurological:     Mental Status: She is alert and oriented to person, place, and time.  Psychiatric:        Behavior: Behavior normal.    BP (!) 143/62   Pulse 93   Ht 5' (1.524 m)  Wt 147 lb 0.6 oz (66.7 kg)   SpO2 98%   BMI 28.72 kg/m  Wt Readings from Last 3 Encounters:  02/04/21 147 lb 0.6 oz (66.7 kg)  11/05/20 148 lb (67.1 kg)  07/31/20 149 lb (67.6 kg)     There are no preventive care reminders to display for this patient.   There are no preventive care reminders to display for this patient.  Lab Results  Component Value Date   TSH 3.02 07/31/2020   No results found for: WBC, HGB, HCT, MCV, PLT Lab Results  Component Value Date   NA 140 11/05/2020   K 4.5 11/05/2020   CO2 25 11/05/2020   GLUCOSE 124 (H) 11/05/2020   BUN 25 11/05/2020   CREATININE 0.96 (H) 11/05/2020   BILITOT 1.1 11/05/2020   ALKPHOS 69 09/24/2016   AST 13 11/05/2020   ALT 11 11/05/2020   PROT 6.3 11/05/2020   ALBUMIN 4.3 09/24/2016   CALCIUM 9.8 11/05/2020   Lab Results  Component Value Date   CHOL 108 05/29/2020   Lab Results  Component Value Date   HDL 36 (L) 05/29/2020   Lab Results  Component Value Date   LDLCALC 50 05/29/2020   Lab Results  Component Value Date   TRIG 136 05/29/2020   Lab Results  Component Value Date   CHOLHDL 3.0 05/29/2020   Lab Results  Component Value Date   HGBA1C 7.5 (A) 02/04/2021      Assessment & Plan:   Problem List Items Addressed This Visit       Cardiovascular and Mediastinum   Essential hypertension    Blood pressure is mildly elevated today.  Concerned about the low diastolic pressure.  Think we will consider switching the losartan to valsartan hand.       Relevant Orders   TSH   BASIC METABOLIC PANEL WITH GFR     Endocrine   Hypothyroidism    Recheck TSH.        Controlled type 2 diabetes mellitus with microalbuminuria, with  long-term current use of insulin (HCC) - Primary    A1c is stable at 7.5 though I would like to get it down just a little bit more.  We will try increasing Trulicity dose.  Continue to work on healthy diet and portion control and staying active.  Follow-up in 3 months.  Due for labs today as well.       Relevant Medications   Dulaglutide (TRULICITY) 3 HE/1.7EY SOPN   Other Relevant Orders   TSH   BASIC METABOLIC PANEL WITH GFR   POCT HgB A1C (Completed)   POCT UA - Microalbumin (Completed)   Other Visit Diagnoses     Mid back pain       Pre-ulcerative corn or callous       Relevant Orders   Ambulatory referral to Podiatry      Mid back pain though it seems to have improved this week most likely musculoskeletal but she is very worried about kidney function.  We will do a BMP and urinalysis today.  Callus on the right foot-we will go ahead and place new podiatry referral to Dr. Earleen Newport [name].  Meds ordered this encounter  Medications   Dulaglutide (TRULICITY) 3 CX/4.4YJ SOPN    Sig: Inject 3 mg as directed once a week.    Dispense:  6 mL    Refill:  1    Follow-up: Return in about 2 weeks (around 02/18/2021) for Nurse  visit recheck BP and 3 months with PCP for diabetes.    [name], MD

## 2021-02-04 NOTE — Assessment & Plan Note (Addendum)
A1c is stable at 7.5 though I would like to get it down just a little bit more.  We will try increasing Trulicity dose.  Continue to work on healthy diet and portion control and staying active.  Follow-up in 3 months.  Due for labs today as well.

## 2021-02-04 NOTE — Assessment & Plan Note (Signed)
Blood pressure is mildly elevated today.  Concerned about the low diastolic pressure.  Think we will consider switching the losartan to valsartan hand.

## 2021-02-04 NOTE — Assessment & Plan Note (Signed)
Recheck TSH 

## 2021-02-06 ENCOUNTER — Other Ambulatory Visit: Payer: Self-pay | Admitting: Family Medicine

## 2021-02-14 ENCOUNTER — Ambulatory Visit (INDEPENDENT_AMBULATORY_CARE_PROVIDER_SITE_OTHER): Payer: Medicare Other | Admitting: Podiatry

## 2021-02-14 ENCOUNTER — Other Ambulatory Visit: Payer: Self-pay

## 2021-02-14 ENCOUNTER — Encounter: Payer: Self-pay | Admitting: Podiatry

## 2021-02-14 ENCOUNTER — Ambulatory Visit (INDEPENDENT_AMBULATORY_CARE_PROVIDER_SITE_OTHER): Payer: Medicare Other

## 2021-02-14 DIAGNOSIS — M21612 Bunion of left foot: Secondary | ICD-10-CM

## 2021-02-14 DIAGNOSIS — M2060 Acquired deformities of toe(s), unspecified, unspecified foot: Secondary | ICD-10-CM

## 2021-02-14 DIAGNOSIS — M21619 Bunion of unspecified foot: Secondary | ICD-10-CM | POA: Diagnosis not present

## 2021-02-14 DIAGNOSIS — B351 Tinea unguium: Secondary | ICD-10-CM

## 2021-02-14 DIAGNOSIS — E119 Type 2 diabetes mellitus without complications: Secondary | ICD-10-CM | POA: Diagnosis not present

## 2021-02-14 DIAGNOSIS — M21611 Bunion of right foot: Secondary | ICD-10-CM | POA: Diagnosis not present

## 2021-02-14 DIAGNOSIS — E1129 Type 2 diabetes mellitus with other diabetic kidney complication: Secondary | ICD-10-CM | POA: Diagnosis not present

## 2021-02-14 DIAGNOSIS — Z794 Long term (current) use of insulin: Secondary | ICD-10-CM

## 2021-02-14 DIAGNOSIS — L602 Onychogryphosis: Secondary | ICD-10-CM

## 2021-02-14 DIAGNOSIS — M79674 Pain in right toe(s): Secondary | ICD-10-CM | POA: Diagnosis not present

## 2021-02-14 DIAGNOSIS — R937 Abnormal findings on diagnostic imaging of other parts of musculoskeletal system: Secondary | ICD-10-CM | POA: Diagnosis not present

## 2021-02-14 DIAGNOSIS — M2061 Acquired deformities of toe(s), unspecified, right foot: Secondary | ICD-10-CM | POA: Diagnosis not present

## 2021-02-14 DIAGNOSIS — Q828 Other specified congenital malformations of skin: Secondary | ICD-10-CM

## 2021-02-14 DIAGNOSIS — R809 Proteinuria, unspecified: Secondary | ICD-10-CM

## 2021-02-14 DIAGNOSIS — M79675 Pain in left toe(s): Secondary | ICD-10-CM | POA: Diagnosis not present

## 2021-02-14 MED ORDER — CICLOPIROX 8 % EX SOLN: Freq: Every day | CUTANEOUS | 2 refills | Status: DC

## 2021-02-14 NOTE — Patient Instructions (Signed)
Ciclopirox nail solution What is this medication? CICLOPIROX (sye kloe PEER ox) NAIL SOLUTION is an antifungal medicine. It usedto treat fungal infections of the nails. This medicine may be used for other purposes; ask your health care provider orpharmacist if you have questions. COMMON BRAND NAME(S): [id number], Penlac What should I tell my care team before I take this medication? They need to know if you have any of these conditions: diabetes mellitus history of seizures HIV infection immune system problems or organ transplant large areas of burned or damaged skin peripheral vascular disease or poor circulation taking corticosteroid medication (including steroid inhalers, cream, or lotion) an unusual or allergic reaction to ciclopirox, isopropyl alcohol, other medicines, foods, dyes, or preservatives pregnant or trying to get pregnant breast-feeding How should I use this medication? This medicine is for external use only. Follow the directions that come with this medicine exactly. Wash and dry your hands before use. Avoid contact with the eyes, mouth or nose. If you do get this medicine in your eyes, rinse out with plenty of cool tap water. Contact your doctor or health care professional if eye irritation occurs. Use at regular intervals. Do not use your medicine more often than directed. Finish the full course prescribed by your doctor or health care professional even if you think you are better. Do not stop usingexcept on your doctor's advice. Talk to your pediatrician regarding the use of this medicine in children. While this medicine may be prescribed for children as young as 12 years for selectedconditions, precautions do apply. Overdosage: If you think you have taken too much of this medicine contact apoison control center or emergency room at once. NOTE: This medicine is only for you. Do not share this medicine with others. What if I miss a dose? If you miss a dose, use it as soon as you  can. If it is almost time for yournext dose, use only that dose. Do not use double or extra doses. What may interact with this medication? Interactions are not expected. Do not use any other skin products withouttelling your doctor or health care professional. This list may not describe all possible interactions. Give your health care provider a list of all the medicines, herbs, non-prescription drugs, or dietary supplements you use. Also tell them if you smoke, drink alcohol, or use illegaldrugs. Some items may interact with your medicine. What should I watch for while using this medication? Tell your doctor or health care professional if your symptoms get worse. Four to six months of treatment may be needed for the nail(s) to improve. Somepeople may not achieve a complete cure or clearing of the nails by this time. Tell your doctor or health care professional if you develop sores or blisters that do not heal properly. If your nail infection returns after stopping usingthis product, contact your doctor or health care professional. What side effects may I notice from receiving this medication? Side effects that you should report to your doctor or health care professionalas soon as possible: allergic reactions like skin rash, itching or hives, swelling of the face, lips, or tongue severe irritation, redness, burning, blistering, peeling, swelling, oozing Side effects that usually do not require medical attention (report to yourdoctor or health care professional if they continue or are bothersome): mild reddening of the skin nail discoloration temporary burning or mild stinging at the site of application This list may not describe all possible side effects. Call your doctor for medical advice about side effects. You may report side   effects to FDA at1-800-FDA-1088. Where should I keep my medication? Keep out of the reach of children. Store at room temperature between 15 and 30 degrees C (59 and 86  degrees F). Do not freeze. Protect from light by storing the bottle in the carton after every use. This medicine is flammable. Keep away from heat and flame. Throw away anyunused medicine after the expiration date. NOTE: This sheet is a summary. It may not cover all possible information. If you have questions about this medicine, talk to your doctor, pharmacist, orhealth care provider.  2022 Elsevier/Gold Standard (2007-10-10 16:49:20)  

## 2021-02-16 DIAGNOSIS — U071 COVID-19: Secondary | ICD-10-CM | POA: Diagnosis not present

## 2021-02-16 NOTE — Progress Notes (Signed)
Subjective:   Patient ID: Yolanda Fields, female   DOB: 75 y.o.   MRN: [id number]   HPI 75 year old female presents the office today for concerns of thick, discolored toenails that she cannot trim her self and particularly her right big toenails most thick and discolored.  She also gets calluses to her feet that should trimmed.  No open sores.  No swelling or redness.  She also has a bunion which is been ongoing for greater than 20 years but not causing any pain.  No recent injury. No other concerns.  Last A1c was 7.5 on February 04, 2021 [name], MD last seen on 02/04/2021   Review of Systems  All other systems reviewed and are negative.  Past Medical History:  Diagnosis Date   Diabetes mellitus without complication ([place])    Hyperlipidemia    Hypertension     Past Surgical History:  Procedure Laterality Date   CESAREAN SECTION       Current Outpatient Medications:    ciclopirox (PENLAC) 8 % solution, Apply topically at bedtime. Apply over nail and surrounding skin. Apply daily over previous coat. After seven (7) days, may remove with alcohol and continue cycle., Disp: 6.6 mL, Rfl: 2   albuterol (VENTOLIN HFA) 108 (90 Base) MCG/ACT inhaler, Inhale into the lungs., Disp: , Rfl:    AMBULATORY NON FORMULARY MEDICATION, Medication Name: True metrix glucometer and strips.  Dx E11.9 for Testing daily., Disp: 1 Units, Rfl: 0   amLODipine (NORVASC) 10 MG tablet, TAKE ONE TABLET BY MOUTH EVERY DAY, Disp: 90 tablet, Rfl: 1   atorvastatin (LIPITOR) 10 MG tablet, TAKE ONE TABLET BY MOUTH AT BEDTIME, Disp: 90 tablet, Rfl: 3   Cholecalciferol 2000 UNITS CAPS, Take by mouth., Disp: , Rfl:    Cinnamon Bark POWD, by Does not apply route., Disp: , Rfl:    cyanocobalamin 1000 MCG tablet, Take 1,000 mcg by mouth daily., Disp: , Rfl:    Dulaglutide (TRULICITY) 3 0000000 SOPN, Inject 3 mg as directed once a week., Disp: 6 mL, Rfl: 1   Empagliflozin-metFORMIN HCl ER (SYNJARDY XR) 25-1000 MG TB24,  Take 1 tablet by mouth daily., Disp: 90 tablet, Rfl: 1   esomeprazole (NEXIUM) 40 MG capsule, TAKE ONE CAPSULE BY MOUTH TWO TIMES A DAY BEFORE A MEAL., Disp: 180 capsule, Rfl: 3   furosemide (LASIX) 40 MG tablet, Take 1 tablet (40 mg total) by mouth daily as needed., Disp: 90 tablet, Rfl: 1   Lancets MISC, To be used to check blood sugars up to four (4) times daily. DX: E11.29, Disp: 200 each, Rfl: 11   LEVEMIR FLEXTOUCH 100 UNIT/ML FlexPen, Inject 30 Units into the skin at bedtime., Disp: 45 mL, Rfl: 3   levothyroxine (SYNTHROID) 100 MCG tablet, TAKE ONE TABLET BY MOUTH EVERY DAY, Disp: 90 tablet, Rfl: 2   losartan (COZAAR) 100 MG tablet, TAKE 1 TABLET BY MOUTH ONCE DAILY, Disp: 90 tablet, Rfl: 1   metoprolol succinate (TOPROL-XL) 25 MG 24 hr tablet, Take 25 mg by mouth daily., Disp: , Rfl:    Multiple Vitamin (MULTIVITAMIN) tablet, Take 1 tablet by mouth daily., Disp: , Rfl:    Travoprost, BAK Free, (TRAVATAN) 0.004 % SOLN ophthalmic solution, SMARTSIG:In Eye(s), Disp: , Rfl:    TRUE METRIX BLOOD GLUCOSE TEST test strip, TEST DAILY, Disp: 100 each, Rfl: 11   TRUEPLUS PEN NEEDLES 32G X 4 MM MISC, USE AS DIRECTED, Disp: 100 each, Rfl: 3  Allergies  Allergen Reactions   Tetanus Toxoids  Palpitations   Amoxicillin-Pot Clavulanate Swelling          Objective:  Physical Exam  General: AAO x3, NAD  Dermatological: Nails are hypertrophic, dystrophic, brittle, discolored, elongated 10. No surrounding redness or drainage. Tenderness nails 1-5 bilaterally.  Minimal hyperkeratotic tissue medial first MPJ bilaterally.  No ongoing ulceration drainage or any signs of infection.  No open lesions.   Vascular: Dorsalis Pedis artery and Posterior Tibial artery pedal pulses are 2/4 bilateral with immedate capillary fill time. There is no pain with calf compression, swelling, warmth, erythema.   Neruologic: Grossly intact via light touch bilateral.   Musculoskeletal: Bunions are present.  There is  prominence of the sesamoid complex bilaterally but there is no other areas of discomfort.  No significant pain associate with the bunion or the prominence.  MMT 5/5.  Gait: Unassisted, Nonantalgic.       Assessment:   75 year old female with symptomatic onychomycosis, hyperkeratotic lesions, asymptomatic bunions     Plan:  -Treatment options discussed including all alternatives, risks, and complications -Etiology of symptoms were discussed -Nails debrided 10 without complications or bleeding.  Discussed treatment options and she wants to proceed with treatment for nail fungus.  Prescribed Penlac. -Debrided hyperkeratotic lesions x2 without any complications or bleeding.  Moisturizer and offloading daily. -Bunions are asymptomatic.  Discussed shoe gear to avoid any pressure to avoid any pain. -Daily foot inspection -Follow-up in 3 months or sooner if any problems arise. In the meantime, encouraged to call the office with any questions, concerns, change in symptoms.   Celesta Gentile, DPM

## 2021-02-19 ENCOUNTER — Telehealth: Payer: Self-pay | Admitting: *Deleted

## 2021-02-19 ENCOUNTER — Ambulatory Visit: Payer: Medicare Other

## 2021-02-19 NOTE — Telephone Encounter (Signed)
Called and left a message for the patient to call me back. Yolanda Fields

## 2021-02-19 NOTE — Telephone Encounter (Signed)
-----   Message from Trula Slade, DPM sent at 02/17/2021  5:49 PM EDT ----- Yolanda Fields was normal- please let her know.

## 2021-03-01 ENCOUNTER — Other Ambulatory Visit: Payer: Self-pay | Admitting: Family Medicine

## 2021-03-19 DIAGNOSIS — U071 COVID-19: Secondary | ICD-10-CM | POA: Diagnosis not present

## 2021-03-20 ENCOUNTER — Other Ambulatory Visit: Payer: Self-pay | Admitting: Family Medicine

## 2021-04-18 DIAGNOSIS — U071 COVID-19: Secondary | ICD-10-CM | POA: Diagnosis not present

## 2021-04-21 ENCOUNTER — Other Ambulatory Visit: Payer: Self-pay | Admitting: Family Medicine

## 2021-04-22 DIAGNOSIS — H43813 Vitreous degeneration, bilateral: Secondary | ICD-10-CM | POA: Diagnosis not present

## 2021-04-22 DIAGNOSIS — E119 Type 2 diabetes mellitus without complications: Secondary | ICD-10-CM | POA: Diagnosis not present

## 2021-04-22 DIAGNOSIS — H25813 Combined forms of age-related cataract, bilateral: Secondary | ICD-10-CM | POA: Diagnosis not present

## 2021-04-22 DIAGNOSIS — H5203 Hypermetropia, bilateral: Secondary | ICD-10-CM | POA: Diagnosis not present

## 2021-04-22 DIAGNOSIS — H401132 Primary open-angle glaucoma, bilateral, moderate stage: Secondary | ICD-10-CM | POA: Diagnosis not present

## 2021-05-04 ENCOUNTER — Other Ambulatory Visit: Payer: Self-pay | Admitting: Family Medicine

## 2021-05-07 ENCOUNTER — Encounter: Payer: Self-pay | Admitting: Family Medicine

## 2021-05-07 ENCOUNTER — Ambulatory Visit (INDEPENDENT_AMBULATORY_CARE_PROVIDER_SITE_OTHER): Payer: Medicare Other | Admitting: Family Medicine

## 2021-05-07 VITALS — BP 134/45 | HR 84 | Ht 60.0 in | Wt 147.0 lb

## 2021-05-07 DIAGNOSIS — J301 Allergic rhinitis due to pollen: Secondary | ICD-10-CM | POA: Diagnosis not present

## 2021-05-07 DIAGNOSIS — E1129 Type 2 diabetes mellitus with other diabetic kidney complication: Secondary | ICD-10-CM

## 2021-05-07 DIAGNOSIS — I10 Essential (primary) hypertension: Secondary | ICD-10-CM | POA: Diagnosis not present

## 2021-05-07 DIAGNOSIS — E785 Hyperlipidemia, unspecified: Secondary | ICD-10-CM

## 2021-05-07 DIAGNOSIS — Z794 Long term (current) use of insulin: Secondary | ICD-10-CM | POA: Diagnosis not present

## 2021-05-07 DIAGNOSIS — R809 Proteinuria, unspecified: Secondary | ICD-10-CM | POA: Diagnosis not present

## 2021-05-07 LAB — POCT GLYCOSYLATED HEMOGLOBIN (HGB A1C): Hemoglobin A1C: 7 % — AB (ref 4.0–5.6)

## 2021-05-07 MED ORDER — LANCETS MISC: 11 refills | Status: AC

## 2021-05-07 MED ORDER — TRUE METRIX BLOOD GLUCOSE TEST VI STRP
ORAL_STRIP | 11 refills | Status: DC
Start: 2021-05-07 — End: 2022-05-14

## 2021-05-07 NOTE — Assessment & Plan Note (Signed)
Well controlled. Continue current regimen. Follow up in  6 mo. Drink pleny of water since the diastolic is low.

## 2021-05-07 NOTE — Progress Notes (Signed)
Established Patient Office Visit  Subjective:  Patient ID: Yolanda Fields, female    DOB: 1946-02-19  Age: 75 y.o. MRN: [id number]  CC:  Chief Complaint  Patient presents with   Diabetes    HPI [name] presents for Diabetes - no hypoglycemic events. No wounds or sores that are not healing well. No increased thirst or urination. Checking glucose at home.  Blood sugars mostly running between 80 and 100.  Occasionally a little bit above that especially if she eats later in the day.  Taking medications as prescribed without any side effects.  Been trying to walk more regularly around her apartment.  Hypertension- Pt denies chest pain, SOB, dizziness, or heart palpitations.  Taking meds as directed w/o problems.  Denies medication side effects.  Ports home blood pressures have looked great.  He has had a lot of persistent sinus drainage it always starts in the morning and she has been taken over-the-counter sinus pill.  She thinks it is an antihistamine.  She says by about 10 AM she starts to get some relief and then feels pretty good the rest of the day she has not had any other symptoms such as cough or congestion or fever.   Past Medical History:  Diagnosis Date   Diabetes mellitus without complication ([place])    Hyperlipidemia    Hypertension     Past Surgical History:  Procedure Laterality Date   CESAREAN SECTION      Family History  Problem Relation Age of Onset   Bladder Cancer Brother    Cancer Sister    Heart failure Mother 61   Diabetes Mother    Heart failure Brother    Diabetes Brother    Pancreatic cancer Brother    Diabetes Sister    Coronary artery disease Father 71   Heart attack Father     Social History   Socioeconomic History   Marital status: Divorced    Spouse name: Not on file   Number of children: 4   Years of education: HS   Highest education level: 12th grade  Occupational History   Occupation: Retired from [employer]   Tobacco Use   Smoking  status: Never   Smokeless tobacco: Never  Vaping Use   Vaping Use: Never used  Substance and Sexual Activity   Alcohol use: No    Alcohol/week: 0.0 standard drinks   Drug use: No   Sexual activity: Not Currently  Other Topics Concern   Not on file  Social History Narrative   Patient is retired from [employer].  consumes 2 caffeine drinks daily and rates her diet as good. Walks 2-3 times a week for 20 minutes. Enjoys watching basketball. She use to enjoy riding her bicycle but she hasn't been able to lately.        Social Determinants of Health   Financial Resource Strain: Low Risk    Difficulty of Paying Living Expenses: Not hard at all  Food Insecurity: No Food Insecurity   Worried About Charity fundraiser in the Last Year: Never true   [place] in the Last Year: Never true  Transportation Needs: No Transportation Needs   Lack of Transportation (Medical): No   Lack of Transportation (Non-Medical): No  Physical Activity: Insufficiently Active   Days of Exercise per Week: 3 days   Minutes of Exercise per Session: 20 min  Stress: No Stress Concern Present   Feeling of Stress : Not at all  Social Connections: Socially Isolated   Frequency of Communication with Friends and Family: More than three times a week   Frequency of Social Gatherings with Friends and Family: Three times a week   Attends Religious Services: Never   Active Member of Clubs or Organizations: No   Attends Archivist Meetings: Never   Marital Status: Divorced  Human resources officer Violence: Not At Risk   Fear of Current or Ex-Partner: No   Emotionally Abused: No   Physically Abused: No   Sexually Abused: No    Outpatient Medications Prior to Visit  Medication Sig Dispense Refill   albuterol (VENTOLIN HFA) 108 (90 Base) MCG/ACT inhaler Inhale into the lungs.     AMBULATORY NON FORMULARY MEDICATION Medication Name: True metrix glucometer and strips.  Dx E11.9 for Testing daily. 1 Units 0    amLODipine (NORVASC) 10 MG tablet TAKE ONE TABLET BY MOUTH EVERY DAY 90 tablet 1   atorvastatin (LIPITOR) 10 MG tablet TAKE ONE TABLET BY MOUTH AT BEDTIME 90 tablet 3   Cholecalciferol 2000 UNITS CAPS Take by mouth.     ciclopirox (PENLAC) 8 % solution Apply topically at bedtime. Apply over nail and surrounding skin. Apply daily over previous coat. After seven (7) days, may remove with alcohol and continue cycle. 6.6 mL 2   Cinnamon Bark POWD by Does not apply route.     cyanocobalamin 1000 MCG tablet Take 1,000 mcg by mouth daily.     Dulaglutide (TRULICITY) 3 WI/0.9BD SOPN Inject 3 mg as directed once a week. 6 mL 1   esomeprazole (NEXIUM) 40 MG capsule TAKE ONE CAPSULE BY MOUTH TWO TIMES A DAY BEFORE A MEAL. 180 capsule 3   furosemide (LASIX) 40 MG tablet Take 1 tablet (40 mg total) by mouth daily as needed. 90 tablet 1   LEVEMIR FLEXTOUCH 100 UNIT/ML FlexPen Inject 30 Units into the skin at bedtime. 45 mL 3   levothyroxine (SYNTHROID) 100 MCG tablet TAKE ONE TABLET BY MOUTH EVERY DAY 90 tablet 2   losartan (COZAAR) 100 MG tablet TAKE 1 TABLET BY MOUTH ONCE DAILY 90 tablet 1   metoprolol succinate (TOPROL-XL) 25 MG 24 hr tablet Take 25 mg by mouth daily.     Multiple Vitamin (MULTIVITAMIN) tablet Take 1 tablet by mouth daily.     SYNJARDY XR 25-1000 MG TB24 Take 1 tablet by mouth daily. 90 tablet 1   Travoprost, BAK Free, (TRAVATAN) 0.004 % SOLN ophthalmic solution SMARTSIG:In Eye(s)     TRUEPLUS PEN NEEDLES 32G X 4 MM MISC USE AS DIRECTED 100 each 3   Lancets MISC To be used to check blood sugars up to four (4) times daily. DX: E11.29 200 each 11   TRUE METRIX BLOOD GLUCOSE TEST test strip TEST DAILY 100 each 11   No facility-administered medications prior to visit.    Allergies  Allergen Reactions   Tetanus Toxoids Palpitations   Amoxicillin-Pot Clavulanate Swelling    ROS Review of Systems    Objective:    Physical Exam Constitutional:      Appearance: Normal appearance.  She is well-developed.  HENT:     Head: Normocephalic and atraumatic.  Cardiovascular:     Rate and Rhythm: Normal rate and regular rhythm.     Heart sounds: Normal heart sounds.  Pulmonary:     Effort: Pulmonary effort is normal.     Breath sounds: Normal breath sounds.  Skin:    General: Skin is warm and dry.  Neurological:  Mental Status: She is alert and oriented to person, place, and time.  Psychiatric:        Behavior: Behavior normal.    BP (!) 134/45   Pulse 84  Wt Readings from Last 3 Encounters:  02/04/21 147 lb 0.6 oz (66.7 kg)  11/05/20 148 lb (67.1 kg)  07/31/20 149 lb (67.6 kg)     There are no preventive care reminders to display for this patient.   There are no preventive care reminders to display for this patient.  Lab Results  Component Value Date   TSH 3.88 02/04/2021   No results found for: WBC, HGB, HCT, MCV, PLT Lab Results  Component Value Date   NA 141 02/04/2021   K 4.4 02/04/2021   CO2 25 02/04/2021   GLUCOSE 144 (H) 02/04/2021   BUN 28 (H) 02/04/2021   CREATININE 1.23 (H) 02/04/2021   BILITOT 1.1 11/05/2020   ALKPHOS 69 09/24/2016   AST 13 11/05/2020   ALT 11 11/05/2020   PROT 6.3 11/05/2020   ALBUMIN 4.3 09/24/2016   CALCIUM 9.7 02/04/2021   EGFR 45 (L) 02/04/2021   Lab Results  Component Value Date   CHOL 108 05/29/2020   Lab Results  Component Value Date   HDL 36 (L) 05/29/2020   Lab Results  Component Value Date   LDLCALC 50 05/29/2020   Lab Results  Component Value Date   TRIG 136 05/29/2020   Lab Results  Component Value Date   CHOLHDL 3.0 05/29/2020   Lab Results  Component Value Date   HGBA1C 7.0 (A) 05/07/2021      Assessment & Plan:   Problem List Items Addressed This Visit       Cardiovascular and Mediastinum   Essential hypertension    Well controlled. Continue current regimen. Follow up in  6 mo. Drink pleny of water since the diastolic is low.         Endocrine   Controlled type 2  diabetes mellitus with microalbuminuria, with long-term current use of insulin ([facility]) - Primary    great work in reducing A1C!  Great job!!! Continue to walk for exercise and work on diet.  Consider inc there Trulicity at next OV if still doing well and maybe dec the insulin dose.   Lab Results  Component Value Date   HGBA1C 7.0 (A) 05/07/2021         Relevant Medications   glucose blood (TRUE METRIX BLOOD GLUCOSE TEST) test strip   Lancets MISC   Other Relevant Orders   POCT glycosylated hemoglobin (Hb A1C) (Completed)     Other   Hyperlipidemia   Relevant Orders   Lipid panel   Other Visit Diagnoses     Seasonal allergic rhinitis due to pollen          Allergic rhinitis-continue with over-the-counter pill.  If she feels like symptoms are progressing or getting worse then please let us know.  Meds ordered this encounter  Medications   glucose blood (TRUE METRIX BLOOD GLUCOSE TEST) test strip    Sig: TEST DAILY. Dx E11.29([facility])    Dispense:  100 each    Refill:  11   Lancets MISC    Sig: To be used to check blood sugars up to four (4) times daily. DX: E11.29 ([facility])    Dispense:  200 each    Refill:  11    Dispense UNILET LANCETS     Follow-up: Return in about 3 months (around 08/11/2021) for   Diabetes follow-up.    [name], MD

## 2021-05-07 NOTE — Assessment & Plan Note (Signed)
great work in reducing A1C!  Great job!!! Continue to walk for exercise and work on diet.  Consider inc there Trulicity at next OV if still doing well and maybe dec the insulin dose.   Lab Results  Component Value Date   HGBA1C 7.0 (A) 05/07/2021

## 2021-05-19 DIAGNOSIS — U071 COVID-19: Secondary | ICD-10-CM | POA: Diagnosis not present

## 2021-05-23 ENCOUNTER — Ambulatory Visit (INDEPENDENT_AMBULATORY_CARE_PROVIDER_SITE_OTHER): Payer: Medicare Other | Admitting: Podiatry

## 2021-05-23 ENCOUNTER — Other Ambulatory Visit: Payer: Self-pay

## 2021-05-23 ENCOUNTER — Encounter: Payer: Self-pay | Admitting: Podiatry

## 2021-05-23 DIAGNOSIS — R809 Proteinuria, unspecified: Secondary | ICD-10-CM | POA: Diagnosis not present

## 2021-05-23 DIAGNOSIS — M79674 Pain in right toe(s): Secondary | ICD-10-CM

## 2021-05-23 DIAGNOSIS — Z794 Long term (current) use of insulin: Secondary | ICD-10-CM | POA: Diagnosis not present

## 2021-05-23 DIAGNOSIS — E1129 Type 2 diabetes mellitus with other diabetic kidney complication: Secondary | ICD-10-CM

## 2021-05-23 DIAGNOSIS — B351 Tinea unguium: Secondary | ICD-10-CM

## 2021-05-23 DIAGNOSIS — M79675 Pain in left toe(s): Secondary | ICD-10-CM

## 2021-05-23 NOTE — Progress Notes (Signed)
Subjective:   Patient ID: Yolanda Fields, female   DOB: 75 y.o.   MRN: [id number]   HPI 75 year old female presents the office today for concerns of thick, discolored toenails that she cannot trim her self and particularly her right big toenails most thick and discolored.  Has been using Penlac.  No open sores.  No swelling or redness. No recent injury. No other concerns.  Last A1c was 7.0 on 05/07/21 [name], MD last seen on 02/04/2021   Review of Systems  All other systems reviewed and are negative.  Past Medical History:  Diagnosis Date   Diabetes mellitus without complication ([place])    Hyperlipidemia    Hypertension     Past Surgical History:  Procedure Laterality Date   CESAREAN SECTION       Current Outpatient Medications:    albuterol (VENTOLIN HFA) 108 (90 Base) MCG/ACT inhaler, Inhale into the lungs., Disp: , Rfl:    AMBULATORY NON FORMULARY MEDICATION, Medication Name: True metrix glucometer and strips.  Dx E11.9 for Testing daily., Disp: 1 Units, Rfl: 0   amLODipine (NORVASC) 10 MG tablet, TAKE ONE TABLET BY MOUTH EVERY DAY, Disp: 90 tablet, Rfl: 1   atorvastatin (LIPITOR) 10 MG tablet, TAKE ONE TABLET BY MOUTH AT BEDTIME, Disp: 90 tablet, Rfl: 3   Cholecalciferol 2000 UNITS CAPS, Take by mouth., Disp: , Rfl:    ciclopirox (PENLAC) 8 % solution, Apply topically at bedtime. Apply over nail and surrounding skin. Apply daily over previous coat. After seven (7) days, may remove with alcohol and continue cycle., Disp: 6.6 mL, Rfl: 2   Cinnamon Bark POWD, by Does not apply route., Disp: , Rfl:    cyanocobalamin 1000 MCG tablet, Take 1,000 mcg by mouth daily., Disp: , Rfl:    Dulaglutide (TRULICITY) 3 GY/6.9SW SOPN, Inject 3 mg as directed once a week., Disp: 6 mL, Rfl: 1   esomeprazole (NEXIUM) 40 MG capsule, TAKE ONE CAPSULE BY MOUTH TWO TIMES A DAY BEFORE A MEAL., Disp: 180 capsule, Rfl: 3   furosemide (LASIX) 40 MG tablet, Take 1 tablet (40 mg total) by mouth  daily as needed., Disp: 90 tablet, Rfl: 1   glucose blood (TRUE METRIX BLOOD GLUCOSE TEST) test strip, TEST DAILY. Dx E11.29([facility]), Disp: 100 each, Rfl: 11   Lancets MISC, To be used to check blood sugars up to four (4) times daily. DX: E11.29 ([facility]), Disp: 200 each, Rfl: 11   LEVEMIR FLEXTOUCH 100 UNIT/ML FlexPen, Inject 30 Units into the skin at bedtime., Disp: 45 mL, Rfl: 3   levothyroxine (SYNTHROID) 100 MCG tablet, TAKE ONE TABLET BY MOUTH EVERY DAY, Disp: 90 tablet, Rfl: 2   losartan (COZAAR) 100 MG tablet, TAKE 1 TABLET BY MOUTH ONCE DAILY, Disp: 90 tablet, Rfl: 1   metoprolol succinate (TOPROL-XL) 25 MG 24 hr tablet, Take 25 mg by mouth daily., Disp: , Rfl:    Multiple Vitamin (MULTIVITAMIN) tablet, Take 1 tablet by mouth daily., Disp: , Rfl:    SYNJARDY XR 25-1000 MG TB24, Take 1 tablet by mouth daily., Disp: 90 tablet, Rfl: 1   Travoprost, BAK Free, (TRAVATAN) 0.004 % SOLN ophthalmic solution, SMARTSIG:In Eye(s), Disp: , Rfl:    TRUEPLUS PEN NEEDLES 32G X 4 MM MISC, USE AS DIRECTED, Disp: 100 each, Rfl: 3  Allergies  Allergen Reactions   Tetanus Toxoids Palpitations   Amoxicillin-Pot Clavulanate Swelling          Objective:  Physical Exam  General: AAO x3, NAD  Dermatological: Nails are hypertrophic, dystrophic, brittle, discolored, elongated 10. No surrounding redness or drainage. Tenderness nails 1-5 bilaterally.  Minimal hyperkeratotic tissue medial first MPJ bilaterally.  No ongoing ulceration drainage or any signs of infection.  No open lesions.   Vascular: Dorsalis Pedis artery and Posterior Tibial artery pedal pulses are 2/4 bilateral with immedate capillary fill time. There is no pain with calf compression, swelling, warmth, erythema.   Neruologic: Grossly intact via light touch bilateral.   Musculoskeletal: Bunions are present.  There is prominence of the sesamoid complex bilaterally but there is no other areas of discomfort.  No  significant pain associate with the bunion or the prominence.  MMT 5/5.  Gait: Unassisted, Nonantalgic.       Assessment:   75 year old female with symptomatic onychomycosis, hyperkeratotic lesions, asymptomatic bunions     Plan:  -Treatment options discussed including all alternatives, risks, and complications -Etiology of symptoms were discussed -Nails debrided 10 without complications or bleeding.  Discussed treatment options and she wants to proceed with treatment for nail fungus.  Continue with penlac.  -Moisturizer and offloading daily. -Daily foot inspection -Follow-up in 3 months or sooner if any problems arise. In the meantime, encouraged to call the office with any questions, concerns, change in symptoms.   [name] , DPM

## 2021-06-04 ENCOUNTER — Other Ambulatory Visit: Payer: Self-pay | Admitting: Family Medicine

## 2021-06-05 DIAGNOSIS — E785 Hyperlipidemia, unspecified: Secondary | ICD-10-CM | POA: Diagnosis not present

## 2021-06-06 ENCOUNTER — Other Ambulatory Visit: Payer: Self-pay | Admitting: *Deleted

## 2021-06-06 LAB — LIPID PANEL
Cholesterol: 132 mg/dL (ref <200)
HDL: 41 mg/dL — ABNORMAL LOW (ref >=50)
LDL Cholesterol (Calc): 68 mg/dL (calc)
Non-HDL Cholesterol (Calc): 91 mg/dL (calc) (ref <130)
Total CHOL/HDL Ratio: 3.2 (calc) (ref <5.0)
Triglycerides: 156 mg/dL — ABNORMAL HIGH (ref <150)

## 2021-06-06 NOTE — Progress Notes (Signed)
Call patient: LDL cholesterol looks great.  Triglycerides are up just a little bit.  Just continue work on [employer] and regular exercise.

## 2021-06-18 DIAGNOSIS — U071 COVID-19: Secondary | ICD-10-CM | POA: Diagnosis not present

## 2021-06-27 ENCOUNTER — Other Ambulatory Visit: Payer: Self-pay | Admitting: Family Medicine

## 2021-06-27 DIAGNOSIS — E1129 Type 2 diabetes mellitus with other diabetic kidney complication: Secondary | ICD-10-CM

## 2021-07-19 DIAGNOSIS — U071 COVID-19: Secondary | ICD-10-CM | POA: Diagnosis not present

## 2021-07-20 HISTORY — PX: TYMPANOSTOMY TUBE PLACEMENT: SHX32

## 2021-07-28 ENCOUNTER — Other Ambulatory Visit: Payer: Self-pay | Admitting: *Deleted

## 2021-07-28 ENCOUNTER — Other Ambulatory Visit: Payer: Self-pay | Admitting: Family Medicine

## 2021-07-28 MED ORDER — LEVEMIR FLEXTOUCH 100 UNIT/ML ~~LOC~~ SOPN: PEN_INJECTOR | SUBCUTANEOUS | 3 refills | Status: DC

## 2021-08-07 ENCOUNTER — Encounter: Payer: Self-pay | Admitting: Family Medicine

## 2021-08-07 ENCOUNTER — Other Ambulatory Visit: Payer: Self-pay

## 2021-08-07 ENCOUNTER — Ambulatory Visit (INDEPENDENT_AMBULATORY_CARE_PROVIDER_SITE_OTHER): Payer: Medicare Other | Admitting: Family Medicine

## 2021-08-07 VITALS — BP 145/46 | HR 98 | Ht 60.0 in | Wt 150.0 lb

## 2021-08-07 DIAGNOSIS — E1129 Type 2 diabetes mellitus with other diabetic kidney complication: Secondary | ICD-10-CM | POA: Diagnosis not present

## 2021-08-07 DIAGNOSIS — E038 Other specified hypothyroidism: Secondary | ICD-10-CM | POA: Diagnosis not present

## 2021-08-07 DIAGNOSIS — Z794 Long term (current) use of insulin: Secondary | ICD-10-CM | POA: Diagnosis not present

## 2021-08-07 DIAGNOSIS — R809 Proteinuria, unspecified: Secondary | ICD-10-CM

## 2021-08-07 DIAGNOSIS — I10 Essential (primary) hypertension: Secondary | ICD-10-CM | POA: Diagnosis not present

## 2021-08-07 DIAGNOSIS — N1832 Chronic kidney disease, stage 3b: Secondary | ICD-10-CM | POA: Diagnosis not present

## 2021-08-07 LAB — POCT GLYCOSYLATED HEMOGLOBIN (HGB A1C): Hemoglobin A1C: 7.6 % — AB (ref 4.0–5.6)

## 2021-08-07 MED ORDER — SYNJARDY XR 25-1000 MG PO TB24: 1.0000 | ORAL_TABLET | Freq: Every day | ORAL | 1 refills | Status: DC

## 2021-08-07 MED ORDER — TRULICITY 4.5 MG/0.5ML ~~LOC~~ SOAJ
4.5000 mg | SUBCUTANEOUS | 1 refills | Status: DC
Start: 2021-08-07 — End: 2021-08-11

## 2021-08-07 MED ORDER — LEVOTHYROXINE SODIUM 100 MCG PO TABS: 100.0000 ug | ORAL_TABLET | Freq: Every day | ORAL | 1 refills | Status: DC

## 2021-08-07 MED ORDER — ATORVASTATIN CALCIUM 10 MG PO TABS: 10.0000 mg | ORAL_TABLET | Freq: Every day | ORAL | 3 refills | Status: DC

## 2021-08-07 NOTE — Assessment & Plan Note (Signed)
Due to recheck TSH. 

## 2021-08-07 NOTE — Progress Notes (Signed)
Eye exam done 05/2021 at [facility] Celine Ahr. Record release form sent.

## 2021-08-07 NOTE — Patient Instructions (Signed)
Okay to cut amlodipine in half and take a half a tab daily for the next 2 weeks.  We can see if the bottom blood pressure is a little bit better. Also sent over a new prescription for the 4.5 mg Trulicity.

## 2021-08-07 NOTE — Assessment & Plan Note (Signed)
A1c went up significantly to 7.6 but she reported she had some old Trulicity left over she thinks it is the 1.5 mg and when she ran out of the 3 mg she went back to using the old 1.  We discussed that she could double up on the shot to equal 3 mg and then also can increase it to 4.5 mg I will send over new prescription she feels like she has been trying pretty hard with her diet though she admits she is not exercising as much as she would like to.

## 2021-08-07 NOTE — Progress Notes (Signed)
Established Patient Office Visit  Subjective:  Patient ID: Yolanda Fields, female    DOB: August 12, 1945  Age: 76 y.o. MRN: [id number]  CC:  Chief Complaint  Patient presents with   Diabetes    Follow up     HPI Yolanda Fields presents for   Diabetes - no hypoglycemic events. No wounds or sores that are not healing well. No increased thirst or urination. Checking glucose at home. Taking medications as prescribed without any side effects.  Hypothyroidism - Taking medication regularly in the AM away from food and vitamins, etc. No recent change to skin, hair, or energy levels.   Past Medical History:  Diagnosis Date   Diabetes mellitus without complication ([place])    Hyperlipidemia    Hypertension     Past Surgical History:  Procedure Laterality Date   CESAREAN SECTION      Family History  Problem Relation Age of Onset   Bladder Cancer Brother    Cancer Sister    Heart failure Mother 34   Diabetes Mother    Heart failure Brother    Diabetes Brother    Pancreatic cancer Brother    Diabetes Sister    Coronary artery disease Father 43   Heart attack Father     Social History   Socioeconomic History   Marital status: Divorced    Spouse name: Not on file   Number of children: 4   Years of education: HS   Highest education level: 12th grade  Occupational History   Occupation: Retired from [employer]   Tobacco Use   Smoking status: Never   Smokeless tobacco: Never  Vaping Use   Vaping Use: Never used  Substance and Sexual Activity   Alcohol use: No    Alcohol/week: 0.0 standard drinks   Drug use: No   Sexual activity: Not Currently  Other Topics Concern   Not on file  Social History Narrative   Patient is retired from [employer].  consumes 2 caffeine drinks daily and rates her diet as good. Walks 2-3 times a week for 20 minutes. Enjoys watching basketball. She use to enjoy riding her bicycle but she hasn't been able to lately.        Social Determinants of Health   Financial  Resource Strain: Low Risk    Difficulty of Paying Living Expenses: Not hard at all  Food Insecurity: No Food Insecurity   Worried About Charity fundraiser in the Last Year: Never true   [place] in the Last Year: Never true  Transportation Needs: No Transportation Needs   Lack of Transportation (Medical): No   Lack of Transportation (Non-Medical): No  Physical Activity: Insufficiently Active   Days of Exercise per Week: 3 days   Minutes of Exercise per Session: 20 min  Stress: No Stress Concern Present   Feeling of Stress : Not at all  Social Connections: Socially Isolated   Frequency of Communication with Friends and Family: More than three times a week   Frequency of Social Gatherings with Friends and Family: Three times a week   Attends Religious Services: Never   Active Member of Clubs or Organizations: No   Attends Archivist Meetings: Never   Marital Status: Divorced  Human resources officer Violence: Not At Risk   Fear of Current or Ex-Partner: No   Emotionally Abused: No   Physically Abused: No   Sexually Abused: No    Outpatient Medications Prior to Visit  Medication Sig Dispense Refill  AMBULATORY NON FORMULARY MEDICATION Medication Name: True metrix glucometer and strips.  Dx E11.9 for Testing daily. 1 Units 0   amLODipine (NORVASC) 10 MG tablet TAKE ONE TABLET BY MOUTH EVERY DAY 90 tablet 1   Cholecalciferol 2000 UNITS CAPS Take by mouth.     ciclopirox (PENLAC) 8 % solution Apply topically at bedtime. Apply over nail and surrounding skin. Apply daily over previous coat. After seven (7) days, may remove with alcohol and continue cycle. 6.6 mL 2   Cinnamon Bark POWD by Does not apply route.     cyanocobalamin 1000 MCG tablet Take 1,000 mcg by mouth daily.     esomeprazole (NEXIUM) 40 MG capsule TAKE ONE CAPSULE BY MOUTH TWO TIMES A DAY BEFORE A MEAL. 180 capsule 1   furosemide (LASIX) 40 MG tablet Take 1 tablet (40 mg total) by mouth daily as needed. 90  tablet 1   glucose blood (TRUE METRIX BLOOD GLUCOSE TEST) test strip TEST DAILY. Dx E11.29([facility]) 100 each 11   insulin detemir (LEVEMIR FLEXTOUCH) 100 UNIT/ML FlexPen Inject 30 Units into the skin at bedtime. 45 mL 3   Lancets MISC To be used to check blood sugars up to four (4) times daily. DX: E11.29 ([facility]) 200 each 11   losartan (COZAAR) 100 MG tablet TAKE 1 TABLET BY MOUTH ONCE DAILY 90 tablet 1   Multiple Vitamin (MULTIVITAMIN) tablet Take 1 tablet by mouth daily.     Travoprost, BAK Free, (TRAVATAN) 0.004 % SOLN ophthalmic solution SMARTSIG:In Eye(s)     TRUEPLUS PEN NEEDLES 32G X 4 MM MISC USE AS DIRECTED 100 each 3   albuterol (VENTOLIN HFA) 108 (90 Base) MCG/ACT inhaler Inhale into the lungs.     atorvastatin (LIPITOR) 10 MG tablet TAKE ONE TABLET BY MOUTH AT BEDTIME 90 tablet 3   levothyroxine (SYNTHROID) 100 MCG tablet TAKE ONE TABLET BY MOUTH EVERY DAY 90 tablet 2   SYNJARDY XR 25-1000 MG TB24 Take 1 tablet by mouth daily. 90 tablet 1   TRULICITY 3 BS/9.6GE SOPN Inject 3 mg as directed once a week. 6 mL 1   metoprolol succinate (TOPROL-XL) 25 MG 24 hr tablet Take 25 mg by mouth daily.     No facility-administered medications prior to visit.    Allergies  Allergen Reactions   Tetanus Toxoids Palpitations   Amoxicillin-Pot Clavulanate Swelling    ROS Review of Systems    Objective:    Physical Exam Constitutional:      Appearance: Normal appearance. She is well-developed.  HENT:     Head: Normocephalic and atraumatic.  Cardiovascular:     Rate and Rhythm: Normal rate and regular rhythm.     Heart sounds: Normal heart sounds.  Pulmonary:     Effort: Pulmonary effort is normal.     Breath sounds: Normal breath sounds.  Skin:    General: Skin is warm and dry.  Neurological:     Mental Status: She is alert and oriented to person, place, and time.  Psychiatric:        Behavior: Behavior normal.    BP (!) 145/46    Pulse 98    Ht 5'  (1.524 m)    Wt 150 lb (68 kg)    SpO2 100%    BMI 29.29 kg/m  Wt Readings from Last 3 Encounters:  08/07/21 150 lb (68 kg)  05/07/21 147 lb (66.7 kg)  02/04/21 147 lb 0.6 oz (66.7 kg)     Health Maintenance Due  Topic Date Due   OPHTHALMOLOGY EXAM  12/04/2020    There are no preventive care reminders to display for this patient.  Lab Results  Component Value Date   TSH 3.88 02/04/2021   No results found for: WBC, HGB, HCT, MCV, PLT Lab Results  Component Value Date   NA 141 02/04/2021   K 4.4 02/04/2021   CO2 25 02/04/2021   GLUCOSE 144 (H) 02/04/2021   BUN 28 (H) 02/04/2021   CREATININE 1.23 (H) 02/04/2021   BILITOT 1.1 11/05/2020   ALKPHOS 69 09/24/2016   AST 13 11/05/2020   ALT 11 11/05/2020   PROT 6.3 11/05/2020   ALBUMIN 4.3 09/24/2016   CALCIUM 9.7 02/04/2021   EGFR 45 (L) 02/04/2021   Lab Results  Component Value Date   CHOL 132 06/05/2021   Lab Results  Component Value Date   HDL 41 (L) 06/05/2021   Lab Results  Component Value Date   LDLCALC 68 06/05/2021   Lab Results  Component Value Date   TRIG 156 (H) 06/05/2021   Lab Results  Component Value Date   CHOLHDL 3.2 06/05/2021   Lab Results  Component Value Date   HGBA1C 7.6 (A) 08/07/2021      Assessment & Plan:   Problem List Items Addressed This Visit       Cardiovascular and Mediastinum   Essential hypertension    Plan to recheck BP today. She is very worried about her diastoli cpressure being low.  We will have her cut her amlodipine in half and take a half a tab daily for the next 2 weeks and see how her diastolic pressures are doing.      Relevant Medications   atorvastatin (LIPITOR) 10 MG tablet   Other Relevant Orders   BASIC METABOLIC PANEL WITH GFR     Endocrine   Hypothyroidism    Due to recheck TSH.      Relevant Medications   levothyroxine (SYNTHROID) 100 MCG tablet   Other Relevant Orders   TSH   Controlled type 2 diabetes mellitus with microalbuminuria,  with long-term current use of insulin (HCC) - Primary    A1c went up significantly to 7.6 but she reported she had some old Trulicity left over she thinks it is the 1.5 mg and when she ran out of the 3 mg she went back to using the old 1.  We discussed that she could double up on the shot to equal 3 mg and then also can increase it to 4.5 mg I will send over new prescription she feels like she has been trying pretty hard with her diet though she admits she is not exercising as much as she would like to.      Relevant Medications   Empagliflozin-metFORMIN HCl ER (SYNJARDY XR) 25-1000 MG TB24   atorvastatin (LIPITOR) 10 MG tablet   Dulaglutide (TRULICITY) 4.5 HM/0.9OB SOPN   Other Relevant Orders   POCT glycosylated hemoglobin (Hb A1C) (Completed)   BASIC METABOLIC PANEL WITH GFR     Genitourinary   Chronic kidney disease (CKD) stage G3b/A1, moderately decreased glomerular filtration rate (GFR) between 30-44 mL/min/1.73 square meter and albuminuria creatinine ratio less than 30 mg/g (HCC)    Due to recheck renal function.  Following Q 6 mo        Meds ordered this encounter  Medications   Empagliflozin-metFORMIN HCl ER (SYNJARDY XR) 25-1000 MG TB24    Sig: Take 1 tablet by mouth daily.    Dispense:  90 tablet    Refill:  1    This prescription was filled on 04/14/2021. Any refills authorized will be placed on file.   levothyroxine (SYNTHROID) 100 MCG tablet    Sig: Take 1 tablet (100 mcg total) by mouth daily.    Dispense:  90 tablet    Refill:  1    This prescription was filled on 01/31/2021. Any refills authorized will be placed on file.   atorvastatin (LIPITOR) 10 MG tablet    Sig: Take 1 tablet (10 mg total) by mouth at bedtime.    Dispense:  90 tablet    Refill:  3   Dulaglutide (TRULICITY) 4.5 VZ/4.8OL SOPN    Sig: Inject 4.5 mg as directed once a week.    Dispense:  6 mL    Refill:  1    Follow-up: Return in about 3 months (around 11/05/2021) for Diabetes follow-up.     [name], MD

## 2021-08-07 NOTE — Assessment & Plan Note (Signed)
Plan to recheck BP today. She is very worried about her diastoli cpressure being low.  We will have her cut her amlodipine in half and take a half a tab daily for the next 2 weeks and see how her diastolic pressures are doing.

## 2021-08-07 NOTE — Assessment & Plan Note (Signed)
Due to recheck renal function.  Following Q 6 mo

## 2021-08-08 ENCOUNTER — Telehealth: Payer: Self-pay

## 2021-08-08 LAB — BASIC METABOLIC PANEL WITH GFR
BUN/Creatinine Ratio: 18 (calc) (ref 6–22)
BUN: 21 mg/dL (ref 7–25)
CO2: 28 mmol/L (ref 20–32)
Calcium: 9.9 mg/dL (ref 8.6–10.4)
Chloride: 103 mmol/L (ref 98–110)
Creat: 1.18 mg/dL — ABNORMAL HIGH (ref 0.60–1.00)
Glucose, Bld: 140 mg/dL — ABNORMAL HIGH (ref 65–99)
Potassium: 4.7 mmol/L (ref 3.5–5.3)
Sodium: 139 mmol/L (ref 135–146)
eGFR: 48 mL/min/{1.73_m2} — ABNORMAL LOW (ref >=60)

## 2021-08-08 LAB — TSH: TSH: 5.13 mIU/L — ABNORMAL HIGH (ref 0.40–4.50)

## 2021-08-08 NOTE — Telephone Encounter (Signed)
Yolanda Fields states the pharmacy says they are not making Trulicity at the moment. She needs a different medication.

## 2021-08-08 NOTE — Progress Notes (Signed)
Okay, have her increase to 1-1/2 tabs on Sundays and a whole tab the other 6 days a week.  I want her to repeat this pattern each week.  I recommend that she use a pillbox so that she can make sure she is consistent.  Then plan to recheck the thyroid again in about 6 weeks

## 2021-08-08 NOTE — Progress Notes (Signed)
Hi Yolanda Fields, your thyroid is off a little bit again similar to last year it went up to 5.  Please just verify your dosing and let me know if you had missed any doses recently that may have caused a slight change.  Otherwise we may need to make an adjustment.  Kidney function is stable at 1.1.  It actually looks a little bit better than it did 6 months ago when you look better hydrated.

## 2021-08-11 MED ORDER — TIRZEPATIDE 5 MG/0.5ML ~~LOC~~ SOAJ: 5.0000 mg | SUBCUTANEOUS | 0 refills | Status: DC

## 2021-08-11 NOTE — Telephone Encounter (Signed)
Please call patient and let her know that organ to switch to a different weekly injection called Mounjaro.  We will start with 5 mg and then after a month to go up to the next dose

## 2021-08-12 NOTE — Telephone Encounter (Signed)
Patient advised.

## 2021-08-19 DIAGNOSIS — U071 COVID-19: Secondary | ICD-10-CM | POA: Diagnosis not present

## 2021-08-23 ENCOUNTER — Other Ambulatory Visit: Payer: Self-pay | Admitting: Family Medicine

## 2021-08-29 ENCOUNTER — Encounter: Payer: Self-pay | Admitting: Podiatry

## 2021-08-29 ENCOUNTER — Other Ambulatory Visit: Payer: Self-pay

## 2021-08-29 ENCOUNTER — Ambulatory Visit (INDEPENDENT_AMBULATORY_CARE_PROVIDER_SITE_OTHER): Payer: Medicare Other | Admitting: Podiatry

## 2021-08-29 DIAGNOSIS — E1129 Type 2 diabetes mellitus with other diabetic kidney complication: Secondary | ICD-10-CM

## 2021-08-29 DIAGNOSIS — B351 Tinea unguium: Secondary | ICD-10-CM

## 2021-08-29 DIAGNOSIS — M79675 Pain in left toe(s): Secondary | ICD-10-CM

## 2021-08-29 DIAGNOSIS — M79674 Pain in right toe(s): Secondary | ICD-10-CM | POA: Diagnosis not present

## 2021-08-29 DIAGNOSIS — R809 Proteinuria, unspecified: Secondary | ICD-10-CM

## 2021-08-29 DIAGNOSIS — Z794 Long term (current) use of insulin: Secondary | ICD-10-CM

## 2021-08-29 NOTE — Progress Notes (Signed)
Subjective:   Patient ID: Yolanda Fields, female   DOB: 76 y.o.   MRN: [id number]   HPI 76 year old female presents the office today for concerns of thick, discolored toenails that she cannot trim her self and particularly her right big toenails most thick and discolored.  Has been using Penlac.  No open sores.  No swelling or redness. No recent injury. No other concerns.  Last A1c was 7.0 on 05/07/21 Yolanda Marry, MD last seen on 02/04/2021   Review of Systems  All other systems reviewed and are negative.  Past Medical History:  Diagnosis Date   Diabetes mellitus without complication ([place])    Hyperlipidemia    Hypertension     Past Surgical History:  Procedure Laterality Date   CESAREAN SECTION       Current Outpatient Medications:    AMBULATORY NON FORMULARY MEDICATION, Medication Name: True metrix glucometer and strips.  Dx E11.9 for Testing daily., Disp: 1 Units, Rfl: 0   amLODipine (NORVASC) 10 MG tablet, TAKE ONE TABLET BY MOUTH EVERY DAY, Disp: 90 tablet, Rfl: 1   atorvastatin (LIPITOR) 10 MG tablet, Take 1 tablet (10 mg total) by mouth at bedtime., Disp: 90 tablet, Rfl: 3   Cholecalciferol 2000 UNITS CAPS, Take by mouth., Disp: , Rfl:    ciclopirox (PENLAC) 8 % solution, Apply topically at bedtime. Apply over nail and surrounding skin. Apply daily over previous coat. After seven (7) days, may remove with alcohol and continue cycle., Disp: 6.6 mL, Rfl: 2   Cinnamon Bark POWD, by Does not apply route., Disp: , Rfl:    cyanocobalamin 1000 MCG tablet, Take 1,000 mcg by mouth daily., Disp: , Rfl:    Empagliflozin-metFORMIN HCl ER (SYNJARDY XR) 25-1000 MG TB24, Take 1 tablet by mouth daily., Disp: 90 tablet, Rfl: 1   esomeprazole (NEXIUM) 40 MG capsule, TAKE ONE CAPSULE BY MOUTH TWO TIMES A DAY BEFORE A MEAL., Disp: 180 capsule, Rfl: 1   furosemide (LASIX) 40 MG tablet, Take 1 tablet (40 mg total) by mouth daily as needed., Disp: 90 tablet, Rfl: 1   glucose blood (TRUE  METRIX BLOOD GLUCOSE TEST) test strip, TEST DAILY. Dx E11.29([facility]), Disp: 100 each, Rfl: 11   insulin detemir (LEVEMIR FLEXTOUCH) 100 UNIT/ML FlexPen, Inject 30 Units into the skin at bedtime., Disp: 45 mL, Rfl: 3   Lancets MISC, To be used to check blood sugars up to four (4) times daily. DX: E11.29 ([facility]), Disp: 200 each, Rfl: 11   levothyroxine (SYNTHROID) 100 MCG tablet, Take 1 tablet (100 mcg total) by mouth daily., Disp: 90 tablet, Rfl: 1   losartan (COZAAR) 100 MG tablet, TAKE 1 TABLET BY MOUTH ONCE DAILY, Disp: 90 tablet, Rfl: 1   Multiple Vitamin (MULTIVITAMIN) tablet, Take 1 tablet by mouth daily., Disp: , Rfl:    tirzepatide (MOUNJARO) 5 MG/0.5ML Pen, Inject 5 mg into the skin once a week., Disp: 2 mL, Rfl: 0   Travoprost, BAK Free, (TRAVATAN) 0.004 % SOLN ophthalmic solution, SMARTSIG:In Eye(s), Disp: , Rfl:    TRUEPLUS PEN NEEDLES 32G X 4 MM MISC, USE AS DIRECTED, Disp: 100 each, Rfl: 3  Allergies  Allergen Reactions   Tetanus Toxoids Palpitations   Amoxicillin-Pot Clavulanate Swelling          Objective:  Physical Exam  General: AAO x3, NAD  Dermatological: Nails are hypertrophic, dystrophic, brittle, discolored, elongated 10. No surrounding redness or drainage. Tenderness nails 1-5 bilaterally.  Minimal hyperkeratotic tissue medial first MPJ bilaterally.  No ongoing ulceration drainage or any signs of infection.  No open lesions.   Vascular: Dorsalis Pedis artery and Posterior Tibial artery pedal pulses are 2/4 bilateral with immedate capillary fill time. There is no pain with calf compression, swelling, warmth, erythema.   Neruologic: Grossly intact via light touch bilateral.   Musculoskeletal: Bunions are present.  There is prominence of the sesamoid complex bilaterally but there is no other areas of discomfort.  No significant pain associate with the bunion or the prominence.  MMT 5/5.  Gait: Unassisted, Nonantalgic.       Assessment:    76 year old female with symptomatic onychomycosis, hyperkeratotic lesions, asymptomatic bunions     Plan:  -Treatment options discussed including all alternatives, risks, and complications -Etiology of symptoms were discussed -Nails debrided 10 without complications or bleeding.  Discussed treatment options and she wants to proceed with treatment for nail fungus.  Continue with penlac.  -Moisturizer and offloading daily. -Daily foot inspection -Follow-up in 3 months or sooner if any problems arise. In the meantime, encouraged to call the office with any questions, concerns, change in symptoms.   [name] , DPM

## 2021-08-29 NOTE — Progress Notes (Deleted)
No show

## 2021-09-02 DIAGNOSIS — H401132 Primary open-angle glaucoma, bilateral, moderate stage: Secondary | ICD-10-CM | POA: Diagnosis not present

## 2021-09-16 DIAGNOSIS — U071 COVID-19: Secondary | ICD-10-CM | POA: Diagnosis not present

## 2021-10-08 ENCOUNTER — Other Ambulatory Visit: Payer: Self-pay | Admitting: Family Medicine

## 2021-10-17 DIAGNOSIS — U071 COVID-19: Secondary | ICD-10-CM | POA: Diagnosis not present

## 2021-10-24 DIAGNOSIS — Z1231 Encounter for screening mammogram for malignant neoplasm of breast: Secondary | ICD-10-CM | POA: Diagnosis not present

## 2021-10-24 LAB — HM MAMMOGRAPHY

## 2021-10-28 ENCOUNTER — Other Ambulatory Visit: Payer: Self-pay | Admitting: Family Medicine

## 2021-10-28 ENCOUNTER — Telehealth: Payer: Self-pay | Admitting: *Deleted

## 2021-10-28 DIAGNOSIS — E1129 Type 2 diabetes mellitus with other diabetic kidney complication: Secondary | ICD-10-CM

## 2021-10-28 MED ORDER — LEVEMIR FLEXPEN 100 UNIT/ML ~~LOC~~ SOPN: 30.0000 [IU] | PEN_INJECTOR | Freq: Every day | SUBCUTANEOUS | 1 refills | Status: DC

## 2021-10-28 NOTE — Telephone Encounter (Signed)
Yolanda Fields called from [facility] stating that the Levemir flextouch is no longer manufactured ?New med sent. ?

## 2021-11-05 DIAGNOSIS — E1159 Type 2 diabetes mellitus with other circulatory complications: Secondary | ICD-10-CM | POA: Insufficient documentation

## 2021-11-05 NOTE — Progress Notes (Signed)
? ?Established Patient Office Visit ? ?Subjective   ?Patient ID: Yolanda Fields, female    DOB: 03-12-1946  Age: 76 y.o. MRN: [id number] ? ?Chief Complaint  ?Patient presents with  ? Diabetes  ?  Follow up   ? Back Pain  ?  Off and on for several months. Patient would like urine test today.   ? ? ?HPI ?Hypertension- Pt denies chest pain, SOB, dizziness, or heart palpitations.  Taking meds as directed w/o problems.  Denies medication side effects.   ? ?Hypothyroidism - Taking medication regularly in the AM away from food and vitamins, etc. No recent change to skin, hair, or energy levels. ? ?F/U CKD 3  - no recent changes.  ? ?Diabetes - no hypoglycemic events. No wounds or sores that are not healing well. No increased thirst or urination. Checking glucose at home. Taking medications as prescribed without any side effects. ? ?He is also had some back pain recently and would like to have her urine checked just to make sure its not a UTI. ? ? ? ? ?ROS ? ?  ?Objective:  ?  ? ?BP (!) 124/56   Pulse 87   Resp 16   Ht 5' (1.524 m)   Wt 144 lb (65.3 kg)   SpO2 98%   BMI 28.12 kg/m?  ? ? ?Physical Exam ?Vitals and nursing note reviewed.  ?Constitutional:   ?   Appearance: She is well-developed.  ?HENT:  ?   Head: Normocephalic and atraumatic.  ?Cardiovascular:  ?   Rate and Rhythm: Normal rate and regular rhythm.  ?   Heart sounds: Normal heart sounds.  ?Pulmonary:  ?   Effort: Pulmonary effort is normal.  ?   Breath sounds: Normal breath sounds.  ?Skin: ?   General: Skin is warm and dry.  ?Neurological:  ?   Mental Status: She is alert and oriented to person, place, and time.  ?Psychiatric:     ?   Behavior: Behavior normal.  ? ? ?Results for orders placed or performed in visit on 11/06/21  ?POCT HgB A1C  ?Result Value Ref Range  ? Hemoglobin A1C 7.2 (A) 4.0 - 5.6 %  ? HbA1c POC (<> result, manual entry)    ? HbA1c, POC (prediabetic range)    ? HbA1c, POC (controlled diabetic range)    ?POCT URINALYSIS DIP ([facility])   ?Result Value Ref Range  ? Color, UA yellow yellow  ? Clarity, UA clear clear  ? Glucose, UA =500 (A) negative mg/dL  ? Bilirubin, UA negative negative  ? Ketones, POC UA negative negative mg/dL  ? Spec Grav, UA 1.010 1.010 - 1.025  ? Blood, UA negative negative  ? pH, UA 5.5 5.0 - 8.0  ? POC PROTEIN,UA negative negative, trace  ? Urobilinogen, UA 0.2 0.2 or 1.0 E.U./dL  ? Nitrite, UA Negative Negative  ? Leukocytes, UA Negative Negative  ? ? ? ? ?The 10-year ASCVD risk score (Arnett DK, et al., 2019) is: 40.6% ? ?  ?Assessment & Plan:  ? ?Problem List Items Addressed This Visit   ? ?  ? Cardiovascular and Mediastinum  ? Type 2 diabetes mellitus with vascular disease ([place])  ?  Stable.  ? ?  ?  ? Relevant Medications  ? amLODipine (NORVASC) 10 MG tablet  ? tirzepatide (MOUNJARO) 5 MG/0.5ML Pen  ? Essential hypertension  ?  Well controlled. Continue current regimen. Follow up in  3-4 mo ? ?  ?  ? Relevant Medications  ?  amLODipine (NORVASC) 10 MG tablet  ?  ? Digestive  ? GERD (gastroesophageal reflux disease)  ?  RF her PPI today.  ? ?  ?  ? Relevant Medications  ? esomeprazole (NEXIUM) 40 MG capsule  ?  ? Endocrine  ? Hypothyroidism  ? Controlled type 2 diabetes mellitus with microalbuminuria, with long-term current use of insulin ([place]) - Primary  ?  A1c improved today from 7.6 down to 7.2.  Continue to work on healthy diet and regular exercise I am okay with her goal being less than 7.5 based on age.  We will continue with [facility] which seems to be working well.  We discussed that we can always go up on the dose at next office visit my goal for her would be to eventually get her completely off of insulin.  But for now we will go down to 28 units daily and if it is on the lower and she can just give 24 encouraged her to keep track of that she is also planning on starting to walk again so that should help as well.  That is why I am holding off on making any major changes except for slightly decreasing her daily  insulin dose. ? ?Lab Results  ?Component Value Date  ? HGBA1C 7.2 (A) 11/06/2021  ? ? ?  ?  ? Relevant Medications  ? tirzepatide [facility]) 5 MG/0.5ML Pen  ? Other Relevant Orders  ? POCT HgB A1C (Completed)  ?  ? Genitourinary  ? Chronic kidney disease (CKD) stage G3b/A1, moderately decreased glomerular filtration rate (GFR) between 30-44 mL/min/1.73 square meter and albuminuria creatinine ratio less than 30 mg/g (HCC)  ?  Stable. Recheck renal function 3 months.  ? ?  ?  ? ?Other Visit Diagnoses   ? ? Dysuria      ? Relevant Orders  ? POCT URINALYSIS DIP ([facility]) (Completed)  ? Type 2 diabetes mellitus with other diabetic kidney complication (HCC)      ? Relevant Medications  ? tirzepatide [facility]) 5 MG/0.5ML Pen  ? Acute midline low back pain without sciatica      ? ?  ? ? ?Low back pain-urinalysis was normal and reassuring.  She has been working out in her yard a lot more in the last couple of weeks.  For now recommend treating as a musculoskeletal strain.  But if not improving or worsening then please return. ? ?Return in about 3 months (around 02/09/2022) for Diabetes follow-up.  ? ? ?[name], MD ? ?

## 2021-11-06 ENCOUNTER — Encounter: Payer: Self-pay | Admitting: Family Medicine

## 2021-11-06 ENCOUNTER — Ambulatory Visit (INDEPENDENT_AMBULATORY_CARE_PROVIDER_SITE_OTHER): Payer: Medicare Other | Admitting: Family Medicine

## 2021-11-06 VITALS — BP 124/56 | HR 87 | Resp 16 | Ht 60.0 in | Wt 144.0 lb

## 2021-11-06 DIAGNOSIS — M545 Low back pain, unspecified: Secondary | ICD-10-CM

## 2021-11-06 DIAGNOSIS — R300 Dysuria: Secondary | ICD-10-CM | POA: Diagnosis not present

## 2021-11-06 DIAGNOSIS — E038 Other specified hypothyroidism: Secondary | ICD-10-CM | POA: Diagnosis not present

## 2021-11-06 DIAGNOSIS — K2100 Gastro-esophageal reflux disease with esophagitis, without bleeding: Secondary | ICD-10-CM | POA: Diagnosis not present

## 2021-11-06 DIAGNOSIS — Z794 Long term (current) use of insulin: Secondary | ICD-10-CM

## 2021-11-06 DIAGNOSIS — N1832 Chronic kidney disease, stage 3b: Secondary | ICD-10-CM

## 2021-11-06 DIAGNOSIS — R809 Proteinuria, unspecified: Secondary | ICD-10-CM

## 2021-11-06 DIAGNOSIS — E1129 Type 2 diabetes mellitus with other diabetic kidney complication: Secondary | ICD-10-CM | POA: Diagnosis not present

## 2021-11-06 DIAGNOSIS — E1159 Type 2 diabetes mellitus with other circulatory complications: Secondary | ICD-10-CM | POA: Diagnosis not present

## 2021-11-06 DIAGNOSIS — I10 Essential (primary) hypertension: Secondary | ICD-10-CM | POA: Diagnosis not present

## 2021-11-06 LAB — POCT GLYCOSYLATED HEMOGLOBIN (HGB A1C): Hemoglobin A1C: 7.2 % — AB (ref 4.0–5.6)

## 2021-11-06 LAB — POCT URINALYSIS DIP (CLINITEK)
Bilirubin, UA: NEGATIVE
Blood, UA: NEGATIVE
Glucose, UA: 500 mg/dL — AB
Ketones, POC UA: NEGATIVE mg/dL
Leukocytes, UA: NEGATIVE
Nitrite, UA: NEGATIVE
POC PROTEIN,UA: NEGATIVE
Spec Grav, UA: 1.01 (ref 1.010–1.025)
Urobilinogen, UA: 0.2 E.U./dL
pH, UA: 5.5 (ref 5.0–8.0)

## 2021-11-06 MED ORDER — ESOMEPRAZOLE MAGNESIUM 40 MG PO CPDR: DELAYED_RELEASE_CAPSULE | ORAL | 1 refills | Status: DC

## 2021-11-06 MED ORDER — MOUNJARO 5 MG/0.5ML ~~LOC~~ SOAJ: SUBCUTANEOUS | 1 refills | Status: DC

## 2021-11-06 MED ORDER — AMLODIPINE BESYLATE 10 MG PO TABS: 10.0000 mg | ORAL_TABLET | Freq: Every day | ORAL | 1 refills | Status: DC

## 2021-11-06 NOTE — Assessment & Plan Note (Signed)
Stable

## 2021-11-06 NOTE — Assessment & Plan Note (Addendum)
A1c improved today from 7.6 down to 7.2.  Continue to work on healthy diet and regular exercise I am okay with her goal being less than 7.5 based on age.  We will continue with [facility] which seems to be working well.  We discussed that we can always go up on the dose at next office visit my goal for her would be to eventually get her completely off of insulin.  But for now we will go down to 28 units daily and if it is on the lower and she can just give 24 encouraged her to keep track of that she is also planning on starting to walk again so that should help as well.  That is why I am holding off on making any major changes except for slightly decreasing her daily insulin dose. ? ?Lab Results  ?Component Value Date  ? HGBA1C 7.2 (A) 11/06/2021  ? ? ?

## 2021-11-06 NOTE — Assessment & Plan Note (Signed)
Stable. Recheck renal function 3 months.  ?

## 2021-11-06 NOTE — Assessment & Plan Note (Signed)
RF her PPI today.  ?

## 2021-11-06 NOTE — Assessment & Plan Note (Signed)
Well controlled. Continue current regimen. Follow up in  3-4 mo  

## 2021-11-06 NOTE — Patient Instructions (Signed)
Okay to decrease your daily insulin shot to 28 units.  If your blood sugar is on the lower and then okay to give just 24 units instead. ? ?If you find your blood sugars are dipping under 100 then please let me know and we can continue to work on decreasing your insulin dose. ?

## 2021-11-10 DIAGNOSIS — H6592 Unspecified nonsuppurative otitis media, left ear: Secondary | ICD-10-CM | POA: Diagnosis not present

## 2021-11-10 DIAGNOSIS — H7292 Unspecified perforation of tympanic membrane, left ear: Secondary | ICD-10-CM | POA: Diagnosis not present

## 2021-11-11 DIAGNOSIS — H6592 Unspecified nonsuppurative otitis media, left ear: Secondary | ICD-10-CM | POA: Diagnosis not present

## 2021-11-11 DIAGNOSIS — H7292 Unspecified perforation of tympanic membrane, left ear: Secondary | ICD-10-CM | POA: Diagnosis not present

## 2021-11-11 DIAGNOSIS — H73011 Bullous myringitis, right ear: Secondary | ICD-10-CM | POA: Diagnosis not present

## 2021-11-16 DIAGNOSIS — U071 COVID-19: Secondary | ICD-10-CM | POA: Diagnosis not present

## 2021-11-17 DIAGNOSIS — H6591 Unspecified nonsuppurative otitis media, right ear: Secondary | ICD-10-CM | POA: Diagnosis not present

## 2021-11-17 DIAGNOSIS — J019 Acute sinusitis, unspecified: Secondary | ICD-10-CM | POA: Diagnosis not present

## 2021-11-17 DIAGNOSIS — H6593 Unspecified nonsuppurative otitis media, bilateral: Secondary | ICD-10-CM | POA: Diagnosis not present

## 2021-11-20 ENCOUNTER — Encounter: Payer: Self-pay | Admitting: Podiatry

## 2021-11-20 ENCOUNTER — Ambulatory Visit (INDEPENDENT_AMBULATORY_CARE_PROVIDER_SITE_OTHER): Payer: Medicare Other | Admitting: Podiatry

## 2021-11-20 DIAGNOSIS — B351 Tinea unguium: Secondary | ICD-10-CM | POA: Diagnosis not present

## 2021-11-20 DIAGNOSIS — M79675 Pain in left toe(s): Secondary | ICD-10-CM | POA: Diagnosis not present

## 2021-11-20 DIAGNOSIS — Z794 Long term (current) use of insulin: Secondary | ICD-10-CM

## 2021-11-20 DIAGNOSIS — E1129 Type 2 diabetes mellitus with other diabetic kidney complication: Secondary | ICD-10-CM

## 2021-11-20 DIAGNOSIS — R809 Proteinuria, unspecified: Secondary | ICD-10-CM

## 2021-11-20 DIAGNOSIS — M79674 Pain in right toe(s): Secondary | ICD-10-CM | POA: Diagnosis not present

## 2021-11-20 NOTE — Progress Notes (Signed)
Subjective:  ? ?Patient ID: Yolanda Fields, female   DOB: 76 y.o.   MRN: [id number]  ? ?HPI ?76 year old female presents the office today for concerns of thick, discolored toenails that she cannot trim her self and particularly her right big toenails most thick and discolored.  Has been using Penlac.  No open sores.  No swelling or redness. No recent injury. No other concerns. ? ?Last A1c was 7.2 on 11/06/21 ?Yolanda Marry, MD last seen on 11/06/21 ? ? ?Review of Systems  ?All other systems reviewed and are negative. ? ?Past Medical History:  ?Diagnosis Date  ? Diabetes mellitus without complication ([place])   ? Hyperlipidemia   ? Hypertension   ? ? ?Past Surgical History:  ?Procedure Laterality Date  ? CESAREAN SECTION    ? ? ? ?Current Outpatient Medications:  ?  AMBULATORY NON FORMULARY MEDICATION, Medication Name: True metrix glucometer and strips.  Dx E11.9 for Testing daily., Disp: 1 Units, Rfl: 0 ?  amLODipine (NORVASC) 10 MG tablet, Take 1 tablet (10 mg total) by mouth daily., Disp: 90 tablet, Rfl: 1 ?  atorvastatin (LIPITOR) 10 MG tablet, Take 1 tablet (10 mg total) by mouth at bedtime., Disp: 90 tablet, Rfl: 3 ?  Cholecalciferol 2000 UNITS CAPS, Take by mouth., Disp: , Rfl:  ?  ciclopirox (PENLAC) 8 % solution, Apply topically at bedtime. Apply over nail and surrounding skin. Apply daily over previous coat. After seven (7) days, may remove with alcohol and continue cycle., Disp: 6.6 mL, Rfl: 2 ?  Cinnamon Bark POWD, by Does not apply route., Disp: , Rfl:  ?  cyanocobalamin 1000 MCG tablet, Take 1,000 mcg by mouth daily., Disp: , Rfl:  ?  Empagliflozin-metFORMIN HCl ER (SYNJARDY XR) 25-1000 MG TB24, Take 1 tablet by mouth daily., Disp: 90 tablet, Rfl: 1 ?  esomeprazole (NEXIUM) 40 MG capsule, TAKE ONE CAPSULE BY MOUTH TWO TIMES A DAY BEFORE A MEAL., Disp: 180 capsule, Rfl: 1 ?  furosemide (LASIX) 40 MG tablet, Take 1 tablet (40 mg total) by mouth daily as needed., Disp: 90 tablet, Rfl: 1 ?  glucose blood  (TRUE METRIX BLOOD GLUCOSE TEST) test strip, TEST DAILY. Dx E11.29([facility]), Disp: 100 each, Rfl: 11 ?  insulin detemir (LEVEMIR FLEXPEN) 100 UNIT/ML FlexPen, Inject 30 Units into the skin at bedtime., Disp: 15 mL, Rfl: 1 ?  Lancets MISC, To be used to check blood sugars up to four (4) times daily. DX: E11.29 ([facility]), Disp: 200 each, Rfl: 11 ?  levothyroxine (SYNTHROID) 100 MCG tablet, Take 1 tablet (100 mcg total) by mouth daily., Disp: 90 tablet, Rfl: 1 ?  losartan (COZAAR) 100 MG tablet, TAKE 1 TABLET BY MOUTH ONCE DAILY, Disp: 90 tablet, Rfl: 1 ?  Multiple Vitamin (MULTIVITAMIN) tablet, Take 1 tablet by mouth daily., Disp: , Rfl:  ?  tirzepatide (MOUNJARO) 5 MG/0.5ML Pen, Inject 5 mg into the skin once a week., Disp: 2 mL, Rfl: 1 ?  Travoprost, BAK Free, (TRAVATAN) 0.004 % SOLN ophthalmic solution, SMARTSIG:In Eye(s), Disp: , Rfl:  ?  TRUEPLUS PEN NEEDLES 32G X 4 MM MISC, USE AS DIRECTED, Disp: 100 each, Rfl: 3 ? ?Allergies  ?Allergen Reactions  ? Tetanus Toxoids Palpitations  ? Amoxicillin-Pot Clavulanate Swelling  ? ? ? ? ? ?   ?Objective:  ?Physical Exam  ?General: AAO x3, NAD ? ?Dermatological: Nails are hypertrophic, dystrophic, brittle, discolored, elongated ?10. No surrounding redness or drainage. Tenderness nails 1-5 bilaterally.  Minimal hyperkeratotic tissue medial first MPJ  bilaterally.  No ongoing ulceration drainage or any signs of infection.  No open lesions. ? ? ?Vascular: Dorsalis Pedis artery and Posterior Tibial artery pedal pulses are 2/4 bilateral with immedate capillary fill time. There is no pain with calf compression, swelling, warmth, erythema.  ? ?Neruologic: Grossly intact via light touch bilateral.  ? ?Musculoskeletal: Bunions are present.  There is prominence of the sesamoid complex bilaterally but there is no other areas of discomfort.  No significant pain associate with the bunion or the prominence.  MMT 5/5. ? ?Gait: Unassisted, Nonantalgic.  ? ? ?    ?Assessment:  ? ?76 year old female with symptomatic onychomycosis, hyperkeratotic lesions, asymptomatic bunions ? ?   ?Plan:  ?-Treatment options discussed including all alternatives, risks, and complications ?-Etiology of symptoms were discussed ?-Nails debrided ?10 without complications or bleeding.  Discussed treatment options and she wants to proceed with treatment for nail fungus.  Continue with penlac.  ?-Moisturizer and offloading daily. ?-Daily foot inspection ?-Follow-up in 3 months or sooner if any problems arise. In the meantime, encouraged to call the office with any questions, concerns, change in symptoms.  ? ?Yolanda Fields , DPM ? ?   ? ? ?

## 2021-11-24 DIAGNOSIS — H6592 Unspecified nonsuppurative otitis media, left ear: Secondary | ICD-10-CM | POA: Diagnosis not present

## 2021-11-24 DIAGNOSIS — H6591 Unspecified nonsuppurative otitis media, right ear: Secondary | ICD-10-CM | POA: Diagnosis not present

## 2021-11-27 ENCOUNTER — Ambulatory Visit: Payer: Medicare Other | Admitting: Podiatry

## 2021-11-28 ENCOUNTER — Encounter: Payer: Self-pay | Admitting: Family Medicine

## 2021-12-09 DIAGNOSIS — H6591 Unspecified nonsuppurative otitis media, right ear: Secondary | ICD-10-CM | POA: Diagnosis not present

## 2021-12-17 DIAGNOSIS — U071 COVID-19: Secondary | ICD-10-CM | POA: Diagnosis not present

## 2021-12-23 DIAGNOSIS — H6591 Unspecified nonsuppurative otitis media, right ear: Secondary | ICD-10-CM | POA: Diagnosis not present

## 2021-12-31 DIAGNOSIS — H401132 Primary open-angle glaucoma, bilateral, moderate stage: Secondary | ICD-10-CM | POA: Diagnosis not present

## 2022-01-09 ENCOUNTER — Other Ambulatory Visit: Payer: Self-pay | Admitting: Family Medicine

## 2022-01-16 ENCOUNTER — Other Ambulatory Visit: Payer: Self-pay | Admitting: Family Medicine

## 2022-01-16 DIAGNOSIS — E038 Other specified hypothyroidism: Secondary | ICD-10-CM

## 2022-01-16 DIAGNOSIS — E1159 Type 2 diabetes mellitus with other circulatory complications: Secondary | ICD-10-CM

## 2022-01-16 DIAGNOSIS — E1129 Type 2 diabetes mellitus with other diabetic kidney complication: Secondary | ICD-10-CM

## 2022-01-16 DIAGNOSIS — R809 Proteinuria, unspecified: Secondary | ICD-10-CM

## 2022-01-16 DIAGNOSIS — U071 COVID-19: Secondary | ICD-10-CM | POA: Diagnosis not present

## 2022-01-23 ENCOUNTER — Emergency Department (INDEPENDENT_AMBULATORY_CARE_PROVIDER_SITE_OTHER)
Admission: EM | Admit: 2022-01-23 | Discharge: 2022-01-23 | Disposition: A | Discharge status: Home / Self Care | Payer: Medicare Other | Source: Home / Self Care

## 2022-01-23 ENCOUNTER — Encounter: Payer: Self-pay | Admitting: Emergency Medicine

## 2022-01-23 DIAGNOSIS — B029 Zoster without complications: Secondary | ICD-10-CM

## 2022-01-23 DIAGNOSIS — H6982 Other specified disorders of Eustachian tube, left ear: Secondary | ICD-10-CM

## 2022-01-23 NOTE — Discharge Instructions (Signed)
Take medications as directed with food to completion. Continue Zyrtec and Flonase. Follow up with ENT in one week if no improvement

## 2022-01-23 NOTE — ED Triage Notes (Signed)
Bilateral ear fullness & pain w/ blisters  Steroid treatment in past Ongoing problem per pt  Hard of hearing  Hoarse voice - denies cold symptoms Also has a rash to left side of back - started 4 days ago (c/o itching) Hx of Shingles (had 1 vaccine)

## 2022-01-25 NOTE — ED Provider Notes (Signed)
[facility]    CSN: [id number] Arrival date & time: 01/23/22  1110      History   Chief Complaint Chief Complaint  Patient presents with   Rash   Ear Fullness    HPI Yolanda Fields is a 76 y.o. female.   Patient presents with two complaints: 1)  She states that two months ago her left ear "popped" resulting in a constant sensation of left ear fullness.  She states that she has a right T-tube in place. 2)  Three days ago she developed a pruritic rash on her left back that has persisted.  She feels well otherwise.  The history is provided by the patient.    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[place]) 11/05/2020   History of COVID-19 PNA 03/29/2020   B12 deficiency 12/29/2019   Vitamin D deficiency 02/21/2018   Family history of colon cancer 02/21/2018   History of squamous cell carcinoma in situ 04/14/2017   Controlled type 2 diabetes mellitus with microalbuminuria, with long-term current use of insulin ([place]) 04/13/2017   Aortic ectasia ([place]) 03/27/2016   Chronic kidney disease (CKD) stage G3b/A1, moderately decreased glomerular filtration rate (GFR) between 30-44 mL/min/1.73 square meter and albuminuria creatinine ratio less than 30 mg/g ([place]) 11/06/2014   Essential hypertension 09/25/2014   GERD (gastroesophageal reflux disease) 09/24/2014   Hypothyroidism 09/24/2014   Glaucoma 09/24/2014   Hearing loss 09/24/2014   Hyperlipidemia 09/24/2014    Past Surgical History:  Procedure Laterality Date   CESAREAN SECTION     TYMPANOSTOMY TUBE PLACEMENT Right 2023    OB History   No obstetric history on file.      Home Medications    Prior to Admission medications   Medication Sig Start Date End Date Taking? Authorizing  Provider  AMBULATORY NON FORMULARY MEDICATION Medication Name: True metrix glucometer and strips.  Dx E11.9 for Testing daily. 02/04/21   [name], MD  amLODipine (NORVASC) 10 MG tablet Take 1 tablet (10 mg total) by mouth daily. 11/06/21   [name], MD  atorvastatin (LIPITOR) 10 MG tablet Take 1 tablet (10 mg total) by mouth at bedtime. 08/07/21   [name], MD  Cholecalciferol 2000 UNITS CAPS Take by mouth.    [provider]  ciclopirox (PENLAC) 8 % solution Apply topically at bedtime. Apply over nail and surrounding skin. Apply daily over previous coat. After seven (7) days, may remove with alcohol and continue cycle. 02/14/21   Trula Slade, DPM  Cinnamon Bark POWD by Does not apply route.    [provider]  cyanocobalamin 1000 MCG tablet Take 1,000 mcg by mouth daily.    [provider]  esomeprazole (NEXIUM) 40 MG capsule TAKE ONE CAPSULE BY MOUTH TWO TIMES A DAY BEFORE A MEAL. 11/06/21   [name], MD  furosemide (LASIX) 40 MG tablet Take 1 tablet (40 mg total) by mouth daily as needed. 08/25/21   [name], MD  glucose blood (TRUE METRIX BLOOD GLUCOSE TEST) test strip TEST DAILY. Dx E11.29([facility]) 05/07/21   [name], MD  Lancets MISC To be used to check blood sugars up to four (4) times daily. DX: E11.29 [facility]) 05/07/21  [name], MD  LEVEMIR FLEXPEN 100 UNIT/ML FlexPen Inject 30 Units into the skin at bedtime. 01/09/22   [name], MD  levothyroxine (SYNTHROID) 100 MCG tablet Take 1 tablet (100 mcg total) by mouth daily. 01/16/22   [name], MD  losartan (COZAAR) 100 MG tablet TAKE 1 TABLET BY MOUTH ONCE DAILY 08/25/21   [name], MD  [facility] 5 MG/0.5ML Pen Inject 5 mg into the skin once a week. 01/16/22   [name], MD  Multiple Vitamin (MULTIVITAMIN) tablet Take 1 tablet by mouth daily.    [provider]  SYNJARDY XR 25-1000 MG TB24 Take 1 tablet by mouth daily. 01/16/22   [name], MD  Travoprost, BAK Free, (TRAVATAN) 0.004 % SOLN ophthalmic solution SMARTSIG:In Eye(s) 06/20/20   [provider]  TRUEPLUS PEN NEEDLES 32G X 4 MM MISC USE AS DIRECTED 03/20/21   [name], MD    Family History Family History  Problem Relation Age of Onset   Bladder Cancer Brother    Cancer Sister    Heart failure Mother 24   Diabetes Mother    Heart failure Brother    Diabetes Brother    Pancreatic cancer Brother    Diabetes Sister    Coronary artery disease Father 80   Heart attack Father     Social History Social History   Tobacco Use   Smoking status: Never   Smokeless tobacco: Never  Vaping Use   Vaping Use: Never used  Substance Use Topics   Alcohol use: No    Alcohol/week: 0.0 standard drinks of alcohol   Drug use: No     Allergies   Tetanus toxoids and Amoxicillin-pot clavulanate   Review of Systems Review of Systems No sore throat No cough No pleuritic pain No wheezing No nasal congestion No post-nasal drainage No sinus pain/pressure + left ear fullness No hemoptysis No SOB No fever/chills No nausea No vomiting No abdominal pain No diarrhea No urinary symptoms + skin rash No fatigue No myalgias No headache   Physical Exam Triage Vital Signs ED Triage Vitals  Enc Vitals Group     BP 01/23/22 1139 (!) 169/77     Pulse Rate 01/23/22 1139 (!) 105     Resp 01/23/22 1139 18     Temp 01/23/22 1139 98.5 F (36.9 C)     Temp src --      SpO2 01/23/22 1139 98 %     Weight 01/23/22 1142 140 lb (63.5 kg)     Height 01/23/22 1142 5' (1.524 m)     Head Circumference --      Peak Flow --      Pain Score 01/23/22 1142 0     Pain Loc --      Pain Edu? --      Excl. in [place]? --    No data found.  Updated Vital Signs BP (!) 169/77 (BP Location: Left Arm)   Pulse (!) 105 Comment: pt talking  Temp 98.5 F (36.9 C)    Resp 18   Ht 5' (1.524 m)   Wt 63.5 kg   SpO2 98%   BMI 27.34 kg/m   Visual Acuity Right Eye Distance:   Left Eye Distance:   Bilateral Distance:    Right Eye Near:   Left Eye Near:    Bilateral Near:     Physical Exam Pulmonary:       Comments: Left posterior chest has  an erythematous herpetiform eruption present as noted on diagram.     Nursing notes and Vital Signs reviewed. Appearance:  Patient appears stated age, and in no acute distress Eyes:  Pupils are equal, round, and reactive to light and accomodation.  Extraocular movement is intact.  Conjunctivae are not inflamed  Ears:  Canals normal.  Right tympanic membrane has T-tube in place that appears patent.  Left tympanic membrane appears normal. Nose:  Mildly congested turbinates.  No sinus tenderness.   Pharynx:  Normal Neck:  Supple. No adenopathy Lungs:  Clear to auscultation.  Breath sounds are equal.  Moving air well. Heart:  Regular rate and rhythm without murmurs, rubs, or gallops.  Abdomen:  Nontender without masses or hepatosplenomegaly.  Bowel sounds are present.  No CVA or flank tenderness.  Extremities:  No edema.  Skin:   Herpetiform rash present left back   UC Treatments / Results  Labs (all labs ordered are listed, but only abnormal results are displayed) Tympanometry:  Right ear tympanogram not performed because of presence of T-tube; Left ear tympanogram low peak height, normal ear volume  EKG   Radiology No results found.  Procedures Procedures (including critical care time)  Medications Ordered in UC Medications - No data to display  Initial Impression / Assessment and Plan / UC Course  I have reviewed the triage vital signs and the nursing notes.  Pertinent labs & imaging results that were available during my care of the patient were reviewed by me and considered in my medical decision making (see chart for details).    Rx written for Valtrex '1000mg'$  TID for one week. Rx written  for prednisone '20mg'$  burst/taper (#11). Followup with ENT if not improving.  Final Clinical Impressions(s) / UC Diagnoses   Final diagnoses:  Eustachian tube dysfunction, left  Herpes zoster without complication     Discharge Instructions      Take medications as directed with food to completion. Continue Zyrtec and Flonase. Follow up with ENT in one week if no improvement         Kandra Nicolas, MD 01/25/22 2156

## 2022-01-28 ENCOUNTER — Telehealth: Payer: Self-pay | Admitting: Emergency Medicine

## 2022-01-28 DIAGNOSIS — H6982 Other specified disorders of Eustachian tube, left ear: Secondary | ICD-10-CM

## 2022-01-28 NOTE — Telephone Encounter (Signed)
Tympanogram does on 01/23/22 on left ear only by Dr Assunta Found. Reading not scanned into Epic. Reordered today for 01/23/22.

## 2022-01-30 DIAGNOSIS — H905 Unspecified sensorineural hearing loss: Secondary | ICD-10-CM | POA: Diagnosis not present

## 2022-01-30 DIAGNOSIS — H6590 Unspecified nonsuppurative otitis media, unspecified ear: Secondary | ICD-10-CM | POA: Diagnosis not present

## 2022-02-08 DIAGNOSIS — M5134 Other intervertebral disc degeneration, thoracic region: Secondary | ICD-10-CM | POA: Diagnosis not present

## 2022-02-08 DIAGNOSIS — Z7984 Long term (current) use of oral hypoglycemic drugs: Secondary | ICD-10-CM | POA: Diagnosis not present

## 2022-02-08 DIAGNOSIS — R7989 Other specified abnormal findings of blood chemistry: Secondary | ICD-10-CM | POA: Diagnosis not present

## 2022-02-08 DIAGNOSIS — Z79899 Other long term (current) drug therapy: Secondary | ICD-10-CM | POA: Diagnosis not present

## 2022-02-08 DIAGNOSIS — Z794 Long term (current) use of insulin: Secondary | ICD-10-CM | POA: Diagnosis not present

## 2022-02-08 DIAGNOSIS — Z888 Allergy status to other drugs, medicaments and biological substances status: Secondary | ICD-10-CM | POA: Diagnosis not present

## 2022-02-08 DIAGNOSIS — R079 Chest pain, unspecified: Secondary | ICD-10-CM | POA: Diagnosis not present

## 2022-02-08 DIAGNOSIS — Z887 Allergy status to serum and vaccine status: Secondary | ICD-10-CM | POA: Diagnosis not present

## 2022-02-08 DIAGNOSIS — R0789 Other chest pain: Secondary | ICD-10-CM | POA: Diagnosis not present

## 2022-02-09 ENCOUNTER — Ambulatory Visit (INDEPENDENT_AMBULATORY_CARE_PROVIDER_SITE_OTHER): Payer: Medicare Other | Admitting: Family Medicine

## 2022-02-09 ENCOUNTER — Encounter: Payer: Self-pay | Admitting: Family Medicine

## 2022-02-09 VITALS — BP 136/53 | HR 75 | Ht 60.0 in | Wt 149.0 lb

## 2022-02-09 DIAGNOSIS — Z794 Long term (current) use of insulin: Secondary | ICD-10-CM | POA: Diagnosis not present

## 2022-02-09 DIAGNOSIS — E1129 Type 2 diabetes mellitus with other diabetic kidney complication: Secondary | ICD-10-CM

## 2022-02-09 DIAGNOSIS — I77819 Aortic ectasia, unspecified site: Secondary | ICD-10-CM

## 2022-02-09 DIAGNOSIS — R809 Proteinuria, unspecified: Secondary | ICD-10-CM

## 2022-02-09 DIAGNOSIS — R0789 Other chest pain: Secondary | ICD-10-CM

## 2022-02-09 DIAGNOSIS — J301 Allergic rhinitis due to pollen: Secondary | ICD-10-CM | POA: Diagnosis not present

## 2022-02-09 DIAGNOSIS — I10 Essential (primary) hypertension: Secondary | ICD-10-CM | POA: Diagnosis not present

## 2022-02-09 DIAGNOSIS — E1159 Type 2 diabetes mellitus with other circulatory complications: Secondary | ICD-10-CM | POA: Diagnosis not present

## 2022-02-09 LAB — POCT UA - MICROALBUMIN
Albumin/Creatinine Ratio, Urine, POC: >300
Creatinine, POC: 10 mg/dL
Microalbumin Ur, POC: 10 mg/L

## 2022-02-09 LAB — POCT GLYCOSYLATED HEMOGLOBIN (HGB A1C): Hemoglobin A1C: 7.7 % — AB (ref 4.0–5.6)

## 2022-02-09 MED ORDER — TIRZEPATIDE 7.5 MG/0.5ML ~~LOC~~ SOAJ: 7.5000 mg | SUBCUTANEOUS | 0 refills | Status: DC

## 2022-02-09 MED ORDER — CETIRIZINE HCL 10 MG PO TABS: 10.0000 mg | ORAL_TABLET | Freq: Every day | ORAL | 3 refills | Status: AC

## 2022-02-09 MED ORDER — FLUTICASONE PROPIONATE 50 MCG/ACT NA SUSP: 1.0000 | Freq: Every day | NASAL | 6 refills | Status: DC

## 2022-02-09 NOTE — Patient Instructions (Signed)
I am going to increase your Mounjaro dose.  If you have some left of your current regimen then please use that up and when she run out then please start the 7.5 mg dose.

## 2022-02-09 NOTE — Assessment & Plan Note (Signed)
A1c done did jump up to 7.7 today but she has been on a couple of rounds of prednisone lately.  We will go ahead and increase Mounjaro to 7.5 mg when she uses up her 5 mg dose.  I still like to see her back in 3 months to make sure she is doing well.

## 2022-02-09 NOTE — Progress Notes (Unsigned)
Established Patient Office Visit  Subjective   Patient ID: Yolanda Fields, female    DOB: 1945-12-07  Age: 76 y.o. MRN: [id number]  Chief Complaint  Patient presents with   Diabetes    HPI  Diabetes - no hypoglycemic events. No wounds or sores that are not healing well. No increased thirst or urination. Checking glucose at home. Taking medications as prescribed without any side effects.  Hypertension- Pt denies chest pain, SOB, dizziness, or heart palpitations.  Taking meds as directed w/o problems.  Denies medication side effects.    She would also like a prescription for Zyrtec and Flonase.  [facility] started her on these medications unfortunately she has been dealing with bilateral ear infections for the last 3 months.  It finally seems to be cleared up after several rounds of antibiotics and steroids.  Also wanted to let me know that over the weekend she woke up with a cramping sensation in the left side of her chest.  She said it was on and off till about lunchtime.  Her daughter encouraged her to go to the ED so she was evaluated there.  She says the pain finally eased off by the end of the day.  He has not had any chest discomfort or pain today.  Cardiac enzymes were negative.  Cardiac function was stable at 1.1.  Chest x-ray was negative for acute findings just showing some degenerative changes of the spine.  EKG was normal with low voltage QRS and slightly increased QT.  {History (Optional):23778}  ROS    Objective:     BP (!) 136/53   Pulse 75   Ht 5' (1.524 m)   Wt 149 lb (67.6 kg)   SpO2 100%   BMI 29.10 kg/m  {Vitals History (Optional):23777}  Physical Exam Vitals and nursing note reviewed.  Constitutional:      Appearance: She is well-developed.  HENT:     Head: Normocephalic and atraumatic.  Cardiovascular:     Rate and Rhythm: Normal rate and regular rhythm.     Heart sounds: Normal heart sounds.  Pulmonary:     Effort: Pulmonary effort is normal.     Breath  sounds: Normal breath sounds.  Skin:    General: Skin is warm and dry.  Neurological:     Mental Status: She is alert and oriented to person, place, and time.  Psychiatric:        Behavior: Behavior normal.      Results for orders placed or performed in visit on 02/09/22  POCT glycosylated hemoglobin (Hb A1C)  Result Value Ref Range   Hemoglobin A1C 7.7 (A) 4.0 - 5.6 %   HbA1c POC (<> result, manual entry)     HbA1c, POC (prediabetic range)     HbA1c, POC (controlled diabetic range)    POCT UA - Microalbumin  Result Value Ref Range   Microalbumin Ur, POC 10 mg/L   Creatinine, POC 10 mg/dL   Albumin/Creatinine Ratio, Urine, POC >300     {Labs (Optional):23779}  The 10-year ASCVD risk score (Arnett DK, et al., 2019) is: 50.6%    Assessment & Plan:   Problem List Items Addressed This Visit       Cardiovascular and Mediastinum   Type 2 diabetes mellitus with vascular disease ([place])    A1c done did jump up to 7.7 today but she has been on a couple of rounds of prednisone lately.  We will go ahead and increase Mounjaro to 7.5 mg when she  uses up her 5 mg dose.  I still like to see her back in 3 months to make sure she is doing well.      Relevant Medications   tirzepatide (MOUNJARO) 7.5 MG/0.5ML Pen   Essential hypertension    .Well controlled. Continue current regimen. Follow up in  6 mo       Aortic ectasia (HCC)    Updated echocardiogram to follow-up on the ectasia which was previously measuring 2.5 cm back in 2016.      Relevant Orders   US AORTA     Endocrine   Controlled type 2 diabetes mellitus with microalbuminuria, with long-term current use of insulin ([facility]CC) - Primary   Relevant Medications   tirzepatide (MOUNJARO) 7.5 MG/0.5ML Pen   Other Relevant Orders   POCT glycosylated hemoglobin (Hb A1C) (Completed)   POCT UA - Microalbumin (Completed)   Other Visit Diagnoses     Allergic rhinitis due to pollen, unspecified seasonality       Relevant  Medications   cetirizine (ZYRTEC) 10 MG tablet   fluticasone (FLONASE) 50 MCG/ACT nasal spray   Atypical chest pain       Relevant Orders   US AORTA   Ambulatory referral to Cardiology       Return in about 3 months (around 05/12/2022) for Diabetes follow-up.    [name], MD

## 2022-02-09 NOTE — Assessment & Plan Note (Addendum)
Well controlled. Continue current regimen. Follow up in  6 mo  

## 2022-02-09 NOTE — Assessment & Plan Note (Signed)
Updated echocardiogram to follow-up on the ectasia which was previously measuring 2.5 cm back in 2016.

## 2022-02-10 DIAGNOSIS — J301 Allergic rhinitis due to pollen: Secondary | ICD-10-CM | POA: Insufficient documentation

## 2022-02-10 NOTE — Assessment & Plan Note (Signed)
See note above

## 2022-02-16 ENCOUNTER — Ambulatory Visit (HOSPITAL_BASED_OUTPATIENT_CLINIC_OR_DEPARTMENT_OTHER)
Admission: RE | Admit: 2022-02-16 | Discharge: 2022-02-16 | Disposition: A | Discharge status: Home / Self Care | Payer: Medicare Other | Source: Ambulatory Visit | Attending: Family Medicine | Admitting: Family Medicine

## 2022-02-16 DIAGNOSIS — U071 COVID-19: Secondary | ICD-10-CM | POA: Diagnosis not present

## 2022-02-16 DIAGNOSIS — I77819 Aortic ectasia, unspecified site: Secondary | ICD-10-CM | POA: Diagnosis not present

## 2022-02-16 DIAGNOSIS — Z136 Encounter for screening for cardiovascular disorders: Secondary | ICD-10-CM | POA: Diagnosis not present

## 2022-02-16 DIAGNOSIS — R0789 Other chest pain: Secondary | ICD-10-CM | POA: Diagnosis not present

## 2022-02-16 NOTE — Progress Notes (Signed)
Great news. They didn't see any aneurysm or widening of the aorta. No further work up needed

## 2022-02-20 ENCOUNTER — Other Ambulatory Visit: Payer: Self-pay | Admitting: Family Medicine

## 2022-02-24 DIAGNOSIS — R079 Chest pain, unspecified: Secondary | ICD-10-CM | POA: Diagnosis not present

## 2022-02-24 DIAGNOSIS — H905 Unspecified sensorineural hearing loss: Secondary | ICD-10-CM | POA: Diagnosis not present

## 2022-02-24 DIAGNOSIS — H6690 Otitis media, unspecified, unspecified ear: Secondary | ICD-10-CM | POA: Diagnosis not present

## 2022-02-24 DIAGNOSIS — M25512 Pain in left shoulder: Secondary | ICD-10-CM | POA: Diagnosis not present

## 2022-02-25 ENCOUNTER — Telehealth: Payer: Self-pay

## 2022-02-25 DIAGNOSIS — R0602 Shortness of breath: Secondary | ICD-10-CM | POA: Diagnosis not present

## 2022-02-25 DIAGNOSIS — Z794 Long term (current) use of insulin: Secondary | ICD-10-CM | POA: Diagnosis not present

## 2022-02-25 DIAGNOSIS — U071 COVID-19: Secondary | ICD-10-CM | POA: Diagnosis not present

## 2022-02-25 DIAGNOSIS — I10 Essential (primary) hypertension: Secondary | ICD-10-CM | POA: Diagnosis not present

## 2022-02-25 DIAGNOSIS — R000 Tachycardia, unspecified: Secondary | ICD-10-CM | POA: Diagnosis not present

## 2022-02-25 DIAGNOSIS — E119 Type 2 diabetes mellitus without complications: Secondary | ICD-10-CM | POA: Diagnosis not present

## 2022-02-25 DIAGNOSIS — Z85828 Personal history of other malignant neoplasm of skin: Secondary | ICD-10-CM | POA: Diagnosis not present

## 2022-02-25 NOTE — Telephone Encounter (Signed)
Agree with documentation as above.   Baruch Lewers, MD  

## 2022-02-25 NOTE — Telephone Encounter (Signed)
[name], Yolanda Fields, called and states Dr Madilyn Fireman needs to send a prescription for a service call for her concentrator. Or a new concentrator.   I spoke [employer] and they need recent testing to show she needs oxygen. Recent office note stating the need for oxygen. We do not have either. I advised [name] to take her to the ED to be evaluated for the need of oxygen. She agreed to take her to the ED.

## 2022-02-25 NOTE — Telephone Encounter (Signed)
Pt's granddaughter called in regards to needing  new script for oxygen, pt states she needs oxygen for testing positive for COVID today.   New script should include the following concentrator  new tanks  long tubing   [facility]; Fax:[phone]

## 2022-02-26 ENCOUNTER — Ambulatory Visit: Payer: Medicare Other | Admitting: Podiatry

## 2022-03-06 ENCOUNTER — Ambulatory Visit (INDEPENDENT_AMBULATORY_CARE_PROVIDER_SITE_OTHER): Payer: Self-pay | Admitting: Podiatry

## 2022-03-06 DIAGNOSIS — Z91199 Patient's noncompliance with other medical treatment and regimen due to unspecified reason: Secondary | ICD-10-CM

## 2022-03-06 NOTE — Progress Notes (Signed)
No show

## 2022-03-09 DIAGNOSIS — L570 Actinic keratosis: Secondary | ICD-10-CM | POA: Diagnosis not present

## 2022-03-09 DIAGNOSIS — L728 Other follicular cysts of the skin and subcutaneous tissue: Secondary | ICD-10-CM | POA: Diagnosis not present

## 2022-03-09 DIAGNOSIS — L821 Other seborrheic keratosis: Secondary | ICD-10-CM | POA: Diagnosis not present

## 2022-03-09 DIAGNOSIS — C4492 Squamous cell carcinoma of skin, unspecified: Secondary | ICD-10-CM | POA: Diagnosis not present

## 2022-03-09 DIAGNOSIS — L814 Other melanin hyperpigmentation: Secondary | ICD-10-CM | POA: Diagnosis not present

## 2022-03-09 DIAGNOSIS — Z85828 Personal history of other malignant neoplasm of skin: Secondary | ICD-10-CM | POA: Diagnosis not present

## 2022-03-12 ENCOUNTER — Ambulatory Visit: Payer: Self-pay | Admitting: Podiatry

## 2022-03-16 DIAGNOSIS — M79662 Pain in left lower leg: Secondary | ICD-10-CM | POA: Diagnosis not present

## 2022-03-16 DIAGNOSIS — R0989 Other specified symptoms and signs involving the circulatory and respiratory systems: Secondary | ICD-10-CM | POA: Diagnosis not present

## 2022-03-16 DIAGNOSIS — R079 Chest pain, unspecified: Secondary | ICD-10-CM | POA: Diagnosis not present

## 2022-03-16 DIAGNOSIS — M79661 Pain in right lower leg: Secondary | ICD-10-CM | POA: Diagnosis not present

## 2022-03-16 DIAGNOSIS — I10 Essential (primary) hypertension: Secondary | ICD-10-CM | POA: Diagnosis not present

## 2022-03-17 DIAGNOSIS — H6590 Unspecified nonsuppurative otitis media, unspecified ear: Secondary | ICD-10-CM | POA: Diagnosis not present

## 2022-03-17 DIAGNOSIS — H903 Sensorineural hearing loss, bilateral: Secondary | ICD-10-CM | POA: Diagnosis not present

## 2022-03-17 DIAGNOSIS — H7292 Unspecified perforation of tympanic membrane, left ear: Secondary | ICD-10-CM | POA: Diagnosis not present

## 2022-03-19 DIAGNOSIS — U071 COVID-19: Secondary | ICD-10-CM | POA: Diagnosis not present

## 2022-03-25 NOTE — Progress Notes (Signed)
I am not sure what else I need to clarify it is in the office visit note and the diagnosis code is attached to the order.  Am I missing something?  It is a follow-up on a previous abnormality.  I am happy to update the note I am just not sure what else is needed that is not already there.  Thank you,   CM

## 2022-03-27 ENCOUNTER — Encounter: Payer: Self-pay | Admitting: Podiatry

## 2022-03-27 ENCOUNTER — Ambulatory Visit (INDEPENDENT_AMBULATORY_CARE_PROVIDER_SITE_OTHER): Payer: Medicare Other | Admitting: Podiatry

## 2022-03-27 DIAGNOSIS — M79674 Pain in right toe(s): Secondary | ICD-10-CM

## 2022-03-27 DIAGNOSIS — E1129 Type 2 diabetes mellitus with other diabetic kidney complication: Secondary | ICD-10-CM

## 2022-03-27 DIAGNOSIS — B351 Tinea unguium: Secondary | ICD-10-CM

## 2022-03-27 DIAGNOSIS — M79675 Pain in left toe(s): Secondary | ICD-10-CM

## 2022-03-27 DIAGNOSIS — R809 Proteinuria, unspecified: Secondary | ICD-10-CM

## 2022-03-27 DIAGNOSIS — Z794 Long term (current) use of insulin: Secondary | ICD-10-CM

## 2022-03-27 NOTE — Progress Notes (Signed)
Subjective:   Patient ID: Yolanda Fields, female   DOB: 76 y.o.   MRN: [id number]   HPI 76 year old female presents the office today for concerns of thick, discolored toenails that she cannot trim her self and particularly her right big toenails most thick and discolored.  Has been using Penlac.  No open sores.  No swelling or redness. No recent injury. No other concerns.  Last A1c was 7.2 on 11/06/21 [name], MD last seen on 11/06/21   Review of Systems  All other systems reviewed and are negative.   Past Medical History:  Diagnosis Date   Diabetes mellitus without complication ([place])    Hyperlipidemia    Hypertension     Past Surgical History:  Procedure Laterality Date   CESAREAN SECTION     TYMPANOSTOMY TUBE PLACEMENT Right 2023     Current Outpatient Medications:    AMBULATORY NON FORMULARY MEDICATION, Medication Name: True metrix glucometer and strips.  Dx E11.9 for Testing daily., Disp: 1 Units, Rfl: 0   amLODipine (NORVASC) 10 MG tablet, Take 1 tablet (10 mg total) by mouth daily., Disp: 90 tablet, Rfl: 1   atorvastatin (LIPITOR) 10 MG tablet, Take 1 tablet (10 mg total) by mouth at bedtime., Disp: 90 tablet, Rfl: 3   cetirizine (ZYRTEC) 10 MG tablet, Take 1 tablet (10 mg total) by mouth daily., Disp: 90 tablet, Rfl: 3   Cholecalciferol 2000 UNITS CAPS, Take by mouth., Disp: , Rfl:    Cinnamon Bark POWD, by Does not apply route., Disp: , Rfl:    cyanocobalamin 1000 MCG tablet, Take 1,000 mcg by mouth daily., Disp: , Rfl:    esomeprazole (NEXIUM) 40 MG capsule, TAKE ONE CAPSULE BY MOUTH TWO TIMES A DAY BEFORE A MEAL., Disp: 180 capsule, Rfl: 1   fluticasone (FLONASE) 50 MCG/ACT nasal spray, Place 1-2 sprays into both nostrils daily., Disp: 16 g, Rfl: 6   furosemide (LASIX) 40 MG tablet, Take 1 tablet (40 mg total) by mouth daily as needed., Disp: 90 tablet, Rfl: 1   glucose blood (TRUE METRIX BLOOD GLUCOSE TEST) test strip, TEST DAILY. Dx E11.29([facility]), Disp: 100 each, Rfl: 11   Lancets MISC, To be used to check blood sugars up to four (4) times daily. DX: E11.29 ([facility]), Disp: 200 each, Rfl: 11   LEVEMIR FLEXPEN 100 UNIT/ML FlexPen, Inject 30 Units into the skin at bedtime., Disp: 15 mL, Rfl: 1   levothyroxine (SYNTHROID) 100 MCG tablet, Take 1 tablet (100 mcg total) by mouth daily., Disp: 90 tablet, Rfl: 1   losartan (COZAAR) 100 MG tablet, TAKE 1 TABLET BY MOUTH ONCE DAILY, Disp: 90 tablet, Rfl: 1   Multiple Vitamin (MULTIVITAMIN) tablet, Take 1 tablet by mouth daily., Disp: , Rfl:    SYNJARDY XR 25-1000 MG TB24, Take 1 tablet by mouth daily., Disp: 90 tablet, Rfl: 1   tirzepatide (MOUNJARO) 7.5 MG/0.5ML Pen, Inject 7.5 mg into the skin once a week., Disp: 6 mL, Rfl: 0   Travoprost, BAK Free, (TRAVATAN) 0.004 % SOLN ophthalmic solution, SMARTSIG:In Eye(s), Disp: , Rfl:    TRUEPLUS PEN NEEDLES 32G X 4 MM MISC, USE AS DIRECTED, Disp: 100 each, Rfl: 3  Allergies  Allergen Reactions   Tetanus Toxoids Palpitations   Amoxicillin-Pot Clavulanate Swelling          Objective:  Physical Exam  General: AAO x3, NAD  Dermatological: Nails are hypertrophic, dystrophic, brittle, discolored, elongated 10. No surrounding redness or drainage. Tenderness nails 1-5 bilaterally.  Minimal hyperkeratotic tissue medial first MPJ bilaterally.  No ongoing ulceration drainage or any signs of infection.  No open lesions.   Vascular: Dorsalis Pedis artery and Posterior Tibial artery pedal pulses are 2/4 bilateral with immedate capillary fill time. There is no pain with calf compression, swelling, warmth, erythema.   Neruologic: Grossly intact via light touch bilateral.   Musculoskeletal: Bunions are present.  There is prominence of the sesamoid complex bilaterally but there is no other areas of discomfort.  No significant pain associate with the bunion or the prominence.  MMT 5/5.  Gait: Unassisted, Nonantalgic.       Assessment:    76 year old female with symptomatic onychomycosis, hyperkeratotic lesions, asymptomatic bunions, Type 2 DM.      Plan:  -Treatment options discussed including all alternatives, risks, and complications -Etiology of symptoms were discussed -Nails debrided 10 without complications or bleeding.  Discussed treatment options and she wants to proceed with treatment for nail fungus.  Continue with penlac.  -Moisturizer and offloading daily. -Daily foot inspection -Follow-up in 3 months or sooner if any problems arise. In the meantime, encouraged to call the office with any questions, concerns, change in symptoms.   [name] , DPM

## 2022-03-30 ENCOUNTER — Other Ambulatory Visit: Payer: Self-pay | Admitting: Family Medicine

## 2022-04-18 DIAGNOSIS — U071 COVID-19: Secondary | ICD-10-CM | POA: Diagnosis not present

## 2022-04-21 DIAGNOSIS — M79662 Pain in left lower leg: Secondary | ICD-10-CM | POA: Diagnosis not present

## 2022-04-21 DIAGNOSIS — M79661 Pain in right lower leg: Secondary | ICD-10-CM | POA: Diagnosis not present

## 2022-04-21 DIAGNOSIS — I10 Essential (primary) hypertension: Secondary | ICD-10-CM | POA: Diagnosis not present

## 2022-04-21 DIAGNOSIS — R0989 Other specified symptoms and signs involving the circulatory and respiratory systems: Secondary | ICD-10-CM | POA: Diagnosis not present

## 2022-04-21 DIAGNOSIS — R079 Chest pain, unspecified: Secondary | ICD-10-CM | POA: Diagnosis not present

## 2022-04-26 ENCOUNTER — Other Ambulatory Visit: Payer: Self-pay | Admitting: Family Medicine

## 2022-05-11 LAB — HM DIABETES EYE EXAM

## 2022-05-12 ENCOUNTER — Ambulatory Visit: Payer: Medicare Other | Admitting: Family Medicine

## 2022-05-14 ENCOUNTER — Ambulatory Visit (INDEPENDENT_AMBULATORY_CARE_PROVIDER_SITE_OTHER): Payer: Medicare Other | Admitting: Family Medicine

## 2022-05-14 ENCOUNTER — Encounter: Payer: Self-pay | Admitting: Family Medicine

## 2022-05-14 VITALS — BP 139/61 | HR 98 | Ht 60.0 in | Wt 146.0 lb

## 2022-05-14 DIAGNOSIS — R809 Proteinuria, unspecified: Secondary | ICD-10-CM

## 2022-05-14 DIAGNOSIS — N1832 Chronic kidney disease, stage 3b: Secondary | ICD-10-CM

## 2022-05-14 DIAGNOSIS — Z794 Long term (current) use of insulin: Secondary | ICD-10-CM

## 2022-05-14 DIAGNOSIS — E1159 Type 2 diabetes mellitus with other circulatory complications: Secondary | ICD-10-CM

## 2022-05-14 DIAGNOSIS — I10 Essential (primary) hypertension: Secondary | ICD-10-CM | POA: Diagnosis not present

## 2022-05-14 DIAGNOSIS — E1129 Type 2 diabetes mellitus with other diabetic kidney complication: Secondary | ICD-10-CM

## 2022-05-14 LAB — POCT GLYCOSYLATED HEMOGLOBIN (HGB A1C): Hemoglobin A1C: 6.9 % — AB (ref 4.0–5.6)

## 2022-05-14 MED ORDER — TRUE METRIX BLOOD GLUCOSE TEST VI STRP: ORAL_STRIP | 11 refills | Status: AC

## 2022-05-14 NOTE — Assessment & Plan Note (Deleted)
Uses 28-20 units of Levemir.

## 2022-05-14 NOTE — Assessment & Plan Note (Signed)
Initial blood pressure quite elevated repeat was improved but still a little borderline.  We will keep an eye on it.  She felt a little frustrated with her check-in process today here in our office.

## 2022-05-14 NOTE — Assessment & Plan Note (Signed)
A1c looks much better today at 6.9.  Which is down from 7.7 she is really doing fantastic.  She had been on some steroids recently so was worried about her A1c.  She is tolerating the [facility] well.  We discussed going up on the medication so we can work on getting her off of her insulin but she says she is very hesitant to adjust it right now.  She would prefer to stay on the same dose and we can follow-up again in 3 months and address it then.

## 2022-05-14 NOTE — Assessment & Plan Note (Signed)
Continue to follow renal function every 6 months. 

## 2022-05-14 NOTE — Progress Notes (Signed)
Established Patient Office Visit  Subjective   Patient ID: Yolanda Fields, female    DOB: 26-Oct-1945  Age: 76 y.o. MRN: [id number]  Chief Complaint  Patient presents with   Diabetes    HPI  Diabetes - no hypoglycemic events. No wounds or sores that are not healing well. No increased thirst or urination. Checking glucose at home. Taking medications as prescribed without any side effects. Still Uses 28-20 units of Levemir.  Also on moujaro  Hypertension- Pt denies chest pain, SOB, dizziness, or heart palpitations.  Taking meds as directed w/o problems.  Denies medication side effects.      ROS    Objective:     BP 139/61   Pulse 98   Ht 5' (1.524 m)   Wt 146 lb (66.2 kg)   SpO2 99%   BMI 28.51 kg/m    Physical Exam Vitals and nursing note reviewed.  Constitutional:      Appearance: She is well-developed.  HENT:     Head: Normocephalic and atraumatic.  Cardiovascular:     Rate and Rhythm: Normal rate and regular rhythm.     Heart sounds: Normal heart sounds.  Pulmonary:     Effort: Pulmonary effort is normal.     Breath sounds: Normal breath sounds.  Skin:    General: Skin is warm and dry.  Neurological:     Mental Status: She is alert and oriented to person, place, and time.  Psychiatric:        Behavior: Behavior normal.      Results for orders placed or performed in visit on 05/14/22  POCT glycosylated hemoglobin (Hb A1C)  Result Value Ref Range   Hemoglobin A1C 6.9 (A) 4.0 - 5.6 %   HbA1c POC (<> result, manual entry)     HbA1c, POC (prediabetic range)     HbA1c, POC (controlled diabetic range)        The 10-year ASCVD risk score (Arnett DK, et al., 2019) is: 52.2%    Assessment & Plan:   Problem List Items Addressed This Visit       Cardiovascular and Mediastinum   Type 2 diabetes mellitus with vascular disease ([place]) - Primary   Relevant Orders   Lipid Panel w/reflex Direct LDL   COMPLETE METABOLIC PANEL WITH GFR   CBC   POCT  glycosylated hemoglobin (Hb A1C) (Completed)   Essential hypertension    Initial blood pressure quite elevated repeat was improved but still a little borderline.  We will keep an eye on it.  She felt a little frustrated with her check-in process today here in our office.      Relevant Orders   Lipid Panel w/reflex Direct LDL   COMPLETE METABOLIC PANEL WITH GFR   CBC     Endocrine   Controlled type 2 diabetes mellitus with microalbuminuria, with long-term current use of insulin (HCC)    A1c looks much better today at 6.9.  Which is down from 7.7 she is really doing fantastic.  She had been on some steroids recently so was worried about her A1c.  She is tolerating the [facility] well.  We discussed going up on the medication so we can work on getting her off of her insulin but she says she is very hesitant to adjust it right now.  She would prefer to stay on the same dose and we can follow-up again in 3 months and address it then.      Relevant Medications   glucose blood (  TRUE METRIX BLOOD GLUCOSE TEST) test strip   Other Relevant Orders   Lipid Panel w/reflex Direct LDL   COMPLETE METABOLIC PANEL WITH GFR   CBC     Genitourinary   Chronic kidney disease (CKD) stage G3b/A1, moderately decreased glomerular filtration rate (GFR) between 30-44 mL/min/1.73 square meter and albuminuria creatinine ratio less than 30 mg/g (HCC)    Continue to follow renal function every 6 months.      Encouraged her to consider getting the flu vaccine especially with her underlying respiratory issues.  Return in about 3 months (around 08/14/2022) for Diabetes follow-up.    [name], MD

## 2022-05-18 DIAGNOSIS — E1159 Type 2 diabetes mellitus with other circulatory complications: Secondary | ICD-10-CM | POA: Diagnosis not present

## 2022-05-18 DIAGNOSIS — R809 Proteinuria, unspecified: Secondary | ICD-10-CM | POA: Diagnosis not present

## 2022-05-18 DIAGNOSIS — I10 Essential (primary) hypertension: Secondary | ICD-10-CM | POA: Diagnosis not present

## 2022-05-18 DIAGNOSIS — E1129 Type 2 diabetes mellitus with other diabetic kidney complication: Secondary | ICD-10-CM | POA: Diagnosis not present

## 2022-05-18 DIAGNOSIS — Z794 Long term (current) use of insulin: Secondary | ICD-10-CM | POA: Diagnosis not present

## 2022-05-19 DIAGNOSIS — U071 COVID-19: Secondary | ICD-10-CM | POA: Diagnosis not present

## 2022-05-19 LAB — COMPLETE METABOLIC PANEL WITH GFR
AG Ratio: 2 (calc) (ref 1.0–2.5)
ALT: 11 U/L (ref 6–29)
AST: 13 U/L (ref 10–35)
Albumin: 4 g/dL (ref 3.6–5.1)
Alkaline phosphatase (APISO): 69 U/L (ref 37–153)
BUN/Creatinine Ratio: 24 (calc) — ABNORMAL HIGH (ref 6–22)
BUN: 26 mg/dL — ABNORMAL HIGH (ref 7–25)
CO2: 24 mmol/L (ref 20–32)
Calcium: 9.7 mg/dL (ref 8.6–10.4)
Chloride: 107 mmol/L (ref 98–110)
Creat: 1.07 mg/dL — ABNORMAL HIGH (ref 0.60–1.00)
Globulin: 2 g/dL (calc) (ref 1.9–3.7)
Glucose, Bld: 132 mg/dL — ABNORMAL HIGH (ref 65–99)
Potassium: 4.6 mmol/L (ref 3.5–5.3)
Sodium: 142 mmol/L (ref 135–146)
Total Bilirubin: 1 mg/dL (ref 0.2–1.2)
Total Protein: 6 g/dL — ABNORMAL LOW (ref 6.1–8.1)
eGFR: 53 mL/min/{1.73_m2} — ABNORMAL LOW (ref >=60)

## 2022-05-19 LAB — CBC
HCT: 44.3 % (ref 35.0–45.0)
Hemoglobin: 14.5 g/dL (ref 11.7–15.5)
MCH: 28.5 pg (ref 27.0–33.0)
MCHC: 32.7 g/dL (ref 32.0–36.0)
MCV: 87 fL (ref 80.0–100.0)
MPV: 8.8 fL (ref 7.5–12.5)
Platelets: 157 10*3/uL (ref 140–400)
RBC: 5.09 10*6/uL (ref 3.80–5.10)
RDW: 12.9 % (ref 11.0–15.0)
WBC: 4.6 10*3/uL (ref 3.8–10.8)

## 2022-05-19 LAB — LIPID PANEL W/REFLEX DIRECT LDL
Cholesterol: 108 mg/dL (ref <200)
HDL: 40 mg/dL — ABNORMAL LOW (ref >=50)
LDL Cholesterol (Calc): 48 mg/dL (calc)
Non-HDL Cholesterol (Calc): 68 mg/dL (calc) (ref <130)
Total CHOL/HDL Ratio: 2.7 (calc) (ref <5.0)
Triglycerides: 115 mg/dL (ref <150)

## 2022-05-19 NOTE — Progress Notes (Signed)
Hi Yolanda Fields, your cholesterol looks great!! Kidney function is stable. All other labs at goal.

## 2022-05-23 ENCOUNTER — Other Ambulatory Visit: Payer: Self-pay | Admitting: Family Medicine

## 2022-05-23 DIAGNOSIS — I10 Essential (primary) hypertension: Secondary | ICD-10-CM

## 2022-05-29 ENCOUNTER — Other Ambulatory Visit: Payer: Self-pay | Admitting: Family Medicine

## 2022-05-29 DIAGNOSIS — E1159 Type 2 diabetes mellitus with other circulatory complications: Secondary | ICD-10-CM

## 2022-06-08 ENCOUNTER — Telehealth: Payer: Self-pay | Admitting: General Practice

## 2022-06-08 NOTE — Telephone Encounter (Signed)
Left message for patient to schedule medicare wellness visit.  

## 2022-06-16 DIAGNOSIS — H903 Sensorineural hearing loss, bilateral: Secondary | ICD-10-CM | POA: Diagnosis not present

## 2022-06-16 DIAGNOSIS — H6591 Unspecified nonsuppurative otitis media, right ear: Secondary | ICD-10-CM | POA: Diagnosis not present

## 2022-06-24 ENCOUNTER — Encounter: Payer: Self-pay | Admitting: Family Medicine

## 2022-06-26 ENCOUNTER — Ambulatory Visit (INDEPENDENT_AMBULATORY_CARE_PROVIDER_SITE_OTHER): Payer: Medicare Other | Admitting: Podiatrist

## 2022-06-26 ENCOUNTER — Encounter: Payer: Self-pay | Admitting: Podiatrist

## 2022-06-26 DIAGNOSIS — M79675 Pain in left toe(s): Secondary | ICD-10-CM

## 2022-06-26 DIAGNOSIS — M79674 Pain in right toe(s): Secondary | ICD-10-CM

## 2022-06-26 DIAGNOSIS — B351 Tinea unguium: Secondary | ICD-10-CM

## 2022-06-26 MED ORDER — CICLOPIROX 8 % EX SOLN: Freq: Every day | CUTANEOUS | 4 refills | Status: DC

## 2022-06-26 NOTE — Progress Notes (Unsigned)
Subjective:   Patient ID: Yolanda Fields, female   DOB: 76 y.o.   MRN: [id number]   HPI 76 year old female presents the office today for concerns of thick, discolored toenails that she cannot trim her self and particularly her right big toenails most thick and discolored.  Has been using Penlac.  No open sores.  No swelling or redness. No recent injury. No other concerns.  Last A1c was 7.2 on 11/06/21 [name], MD last seen on 11/06/21   Review of Systems  All other systems reviewed and are negative.   Past Medical History:  Diagnosis Date   Diabetes mellitus without complication ([place])    Hyperlipidemia    Hypertension     Past Surgical History:  Procedure Laterality Date   CESAREAN SECTION     TYMPANOSTOMY TUBE PLACEMENT Right 2023     Current Outpatient Medications:    AMBULATORY NON FORMULARY MEDICATION, Medication Name: True metrix glucometer and strips.  Dx E11.9 for Testing daily., Disp: 1 Units, Rfl: 0   amLODipine (NORVASC) 10 MG tablet, Take 1 tablet (10 mg total) by mouth daily., Disp: 90 tablet, Rfl: 1   atorvastatin (LIPITOR) 10 MG tablet, Take 1 tablet (10 mg total) by mouth at bedtime., Disp: 90 tablet, Rfl: 3   cetirizine (ZYRTEC) 10 MG tablet, Take 1 tablet (10 mg total) by mouth daily., Disp: 90 tablet, Rfl: 3   Cholecalciferol 2000 UNITS CAPS, Take by mouth., Disp: , Rfl:    Cinnamon Bark POWD, by Does not apply route., Disp: , Rfl:    cyanocobalamin 1000 MCG tablet, Take 1,000 mcg by mouth daily., Disp: , Rfl:    esomeprazole (NEXIUM) 40 MG capsule, TAKE ONE CAPSULE BY MOUTH TWO TIMES A DAY BEFORE A MEAL., Disp: 180 capsule, Rfl: 1   fluticasone (FLONASE) 50 MCG/ACT nasal spray, Place 1-2 sprays into both nostrils daily., Disp: 16 g, Rfl: 6   furosemide (LASIX) 40 MG tablet, Take 1 tablet (40 mg total) by mouth daily as needed., Disp: 90 tablet, Rfl: 1   glucose blood (TRUE METRIX BLOOD GLUCOSE TEST) test strip, TEST DAILY. Dx E11.29([facility]), Disp: 100 each, Rfl: 11   Lancets MISC, To be used to check blood sugars up to four (4) times daily. DX: E11.29 ([facility]), Disp: 200 each, Rfl: 11   LEVEMIR FLEXPEN 100 UNIT/ML FlexPen, Inject 30 Units into the skin at bedtime., Disp: 15 mL, Rfl: 1   levothyroxine (SYNTHROID) 100 MCG tablet, Take 1 tablet (100 mcg total) by mouth daily., Disp: 90 tablet, Rfl: 1   losartan (COZAAR) 100 MG tablet, TAKE 1 TABLET BY MOUTH ONCE DAILY, Disp: 90 tablet, Rfl: 1   MOUNJARO 7.5 MG/0.5ML Pen, Inject 7.5 mg into the skin once a week., Disp: 6 mL, Rfl: 0   Multiple Vitamin (MULTIVITAMIN) tablet, Take 1 tablet by mouth daily., Disp: , Rfl:    SURE COMFORT PEN NEEDLES 32G X 4 MM MISC, USE AS DIRECTED, Disp: 100 each, Rfl: 3   SYNJARDY XR 25-1000 MG TB24, Take 1 tablet by mouth daily., Disp: 90 tablet, Rfl: 1   Travoprost, BAK Free, (TRAVATAN) 0.004 % SOLN ophthalmic solution, SMARTSIG:In Eye(s), Disp: , Rfl:   Allergies  Allergen Reactions   Tetanus Toxoids Palpitations   Amoxicillin-Pot Clavulanate Swelling          Objective:  Physical Exam  General: AAO x3, NAD  Dermatological: Nails are hypertrophic, dystrophic, brittle, discolored, elongated 10. No surrounding redness or drainage. Tenderness nails 1-5 bilaterally.  Minimal hyperkeratotic tissue medial first MPJ bilaterally.  No ongoing ulceration drainage or any signs of infection.  No open lesions.   Vascular: Dorsalis Pedis artery and Posterior Tibial artery pedal pulses are 2/4 bilateral with immedate capillary fill time. There is no pain with calf compression, swelling, warmth, erythema.   Neruologic: Grossly intact via light touch bilateral.   Musculoskeletal: Bunions are present.  There is prominence of the sesamoid complex bilaterally but there is no other areas of discomfort.  No significant pain associate with the bunion or the prominence.  MMT 5/5.  Gait: Unassisted, Nonantalgic.       Assessment:    76 year old female with symptomatic onychomycosis, hyperkeratotic lesions, asymptomatic bunions, Type 2 DM.      Plan:  -Treatment options discussed including all alternatives, risks, and complications -Etiology of symptoms were discussed -Nails debrided 10 without complications or bleeding.  Discussed treatment options and she wants to proceed with treatment for nail fungus.  Continue with penlac.  -Moisturizer and offloading daily. -Daily foot inspection -Follow-up in 3 months or sooner if any problems arise. In the meantime, encouraged to call the office with any questions, concerns, change in symptoms.   [name] , DPM

## 2022-07-10 ENCOUNTER — Telehealth: Payer: Self-pay | Admitting: Family Medicine

## 2022-07-10 NOTE — Telephone Encounter (Signed)
This call patient and let her know that we received notification that Levemir is going to be discontinued.  I encouraged her to call her pharmacy to find out what substitutes will be covered under her current insurance plan and let us know and we are happy to send a new prescription to her pharmacy.  I am not sure when she is due for refill.  Also encouraged her to schedule her Medicare wellness exam with [place].

## 2022-07-18 ENCOUNTER — Other Ambulatory Visit: Payer: Self-pay | Admitting: Family Medicine

## 2022-07-18 DIAGNOSIS — E1129 Type 2 diabetes mellitus with other diabetic kidney complication: Secondary | ICD-10-CM

## 2022-07-21 NOTE — Progress Notes (Deleted)
Referring-Yolanda Metheney MD Reason for referral-chest pain  HPI: 77 year old female for evaluation of chest pain at request of Yolanda Lecher MD.  Echocardiogram 2016 showed normal LV function and grade 1 diastolic dysfunction.  ABIs May 2020 normal bilaterally but abnormal toe-brachial index bilaterally.  Abdominal ultrasound July 2023 showed no abdominal aortic aneurysm.  Current Outpatient Medications  Medication Sig Dispense Refill   AMBULATORY NON FORMULARY MEDICATION Medication Name: True metrix glucometer and strips.  Dx E11.9 for Testing daily. 1 Units 0   amLODipine (NORVASC) 10 MG tablet Take 1 tablet (10 mg total) by mouth daily. 90 tablet 1   atorvastatin (LIPITOR) 10 MG tablet Take 1 tablet (10 mg total) by mouth at bedtime. 90 tablet 3   cetirizine (ZYRTEC) 10 MG tablet Take 1 tablet (10 mg total) by mouth daily. 90 tablet 3   Cholecalciferol 2000 UNITS CAPS Take by mouth.     ciclopirox (PENLAC) 8 % solution Apply topically at bedtime. Apply over nail and surrounding skin. Apply daily over previous coat. After seven (7) days, may remove with alcohol and continue cycle. 6.6 mL 4   Cinnamon Bark POWD by Does not apply route.     cyanocobalamin 1000 MCG tablet Take 1,000 mcg by mouth daily.     esomeprazole (NEXIUM) 40 MG capsule TAKE ONE CAPSULE BY MOUTH TWO TIMES A DAY BEFORE A MEAL. 180 capsule 1   fluticasone (FLONASE) 50 MCG/ACT nasal spray Place 1-2 sprays into both nostrils daily. 16 g 6   furosemide (LASIX) 40 MG tablet Take 1 tablet (40 mg total) by mouth daily as needed. 90 tablet 1   glucose blood (TRUE METRIX BLOOD GLUCOSE TEST) test strip TEST DAILY. Dx E11.29([facility]) 100 each 11   Lancets MISC To be used to check blood sugars up to four (4) times daily. DX: E11.29 ([facility]) 200 each 11   LEVEMIR FLEXPEN 100 UNIT/ML FlexPen Inject 30 Units into the skin at bedtime. 15 mL 1   levothyroxine (SYNTHROID) 100 MCG tablet Take 1 tablet (100  mcg total) by mouth daily. 90 tablet 1   losartan (COZAAR) 100 MG tablet TAKE 1 TABLET BY MOUTH ONCE DAILY 90 tablet 1   MOUNJARO 7.5 MG/0.5ML Pen Inject 7.5 mg into the skin once a week. 6 mL 0   Multiple Vitamin (MULTIVITAMIN) tablet Take 1 tablet by mouth daily.     SURE COMFORT PEN NEEDLES 32G X 4 MM MISC USE AS DIRECTED 100 each 3   SYNJARDY XR 25-1000 MG TB24 Take 1 tablet by mouth daily. 90 tablet 1   Travoprost, BAK Free, (TRAVATAN) 0.004 % SOLN ophthalmic solution SMARTSIG:In Eye(s)     No current facility-administered medications for this visit.    Allergies  Allergen Reactions   Tetanus Toxoids Palpitations   Amoxicillin-Pot Clavulanate Swelling     Past Medical History:  Diagnosis Date   Diabetes mellitus without complication ([place])    Hyperlipidemia    Hypertension     Past Surgical History:  Procedure Laterality Date   CESAREAN SECTION     TYMPANOSTOMY TUBE PLACEMENT Right 2023    Social History   Socioeconomic History   Marital status: Divorced    Spouse name: Not on file   Number of children: 4   Years of education: HS   Highest education level: 12th grade  Occupational History   Occupation: Retired from [employer]   Tobacco Use   Smoking status: Never   Smokeless tobacco: Never  Vaping  Use   Vaping Use: Never used  Substance and Sexual Activity   Alcohol use: No    Alcohol/week: 0.0 standard drinks of alcohol   Drug use: No   Sexual activity: Not Currently  Other Topics Concern   Not on file  Social History Narrative   Patient is retired from [employer].  consumes 2 caffeine drinks daily and rates her diet as good. Walks 2-3 times a week for 20 minutes. Enjoys watching basketball. She use to enjoy riding her bicycle but she hasn't been able to lately.        Social Determinants of Health   Financial Resource Strain: Low Risk  (09/11/2020)   Overall Financial Resource Strain (CARDIA)    Difficulty of Paying Living Expenses: Not hard at all  Food  Insecurity: No Food Insecurity (09/11/2020)   Hunger Vital Sign    Worried About Running Out of Food in the Last Year: Never true    Ran Out of Food in the Last Year: Never true  Transportation Needs: No Transportation Needs (09/11/2020)   PRAPARE - Hydrologist (Medical): No    Lack of Transportation (Non-Medical): No  Physical Activity: Insufficiently Active (09/11/2020)   Exercise Vital Sign    Days of Exercise per Week: 3 days    Minutes of Exercise per Session: 20 min  Stress: No Stress Concern Present (09/11/2020)   [place]    Feeling of Stress : Not at all  Social Connections: Socially Isolated (09/11/2020)   Social Connection and Isolation Panel [NHANES]    Frequency of Communication with Friends and Family: More than three times a week    Frequency of Social Gatherings with Friends and Family: Three times a week    Attends Religious Services: Never    Active Member of Clubs or Organizations: No    Attends Archivist Meetings: Never    Marital Status: Divorced  Human resources officer Violence: Not At Risk (09/11/2020)   Humiliation, Afraid, Rape, and Kick questionnaire    Fear of Current or Ex-Partner: No    Emotionally Abused: No    Physically Abused: No    Sexually Abused: No    Family History  Problem Relation Age of Onset   Bladder Cancer Brother    Cancer Sister    Heart failure Mother 58   Diabetes Mother    Heart failure Brother    Diabetes Brother    Pancreatic cancer Brother    Diabetes Sister    Coronary artery disease Father 19   Heart attack Father     ROS: no fevers or chills, productive cough, hemoptysis, dysphasia, odynophagia, melena, hematochezia, dysuria, hematuria, rash, seizure activity, orthopnea, PND, pedal edema, claudication. Remaining systems are negative.  Physical Exam:   There were no vitals taken for this visit.  General:  Well  developed/well nourished in NAD Skin warm/dry Patient not depressed No peripheral clubbing Back-normal HEENT-normal/normal eyelids Neck supple/normal carotid upstroke bilaterally; no bruits; no JVD; no thyromegaly chest - CTA/ normal expansion CV - RRR/normal S1 and S2; no murmurs, rubs or gallops;  PMI nondisplaced Abdomen -NT/ND, no HSM, no mass, + bowel sounds, no bruit 2+ femoral pulses, no bruits Ext-no edema, chords, 2+ DP Neuro-grossly nonfocal  ECG - personally reviewed  A/P  1 chest pain-  2 hypertension-  3 hyperlipidemia-  Kirk Ruths, MD

## 2022-07-27 ENCOUNTER — Other Ambulatory Visit: Payer: Self-pay | Admitting: Family Medicine

## 2022-07-29 NOTE — Telephone Encounter (Signed)
Patient has AWV scheduled for 08/07/2022  [facility] pharmacy as a prescription for Levemir flex pen was just sent over on 07/27/22. Pharmacy tech states - he does not think that the Levemir is being discontinued but generic Lantus pens has been discontinued.

## 2022-08-03 ENCOUNTER — Ambulatory Visit: Payer: Medicare Other | Admitting: Cardiology

## 2022-08-07 ENCOUNTER — Ambulatory Visit (INDEPENDENT_AMBULATORY_CARE_PROVIDER_SITE_OTHER): Payer: 59 | Admitting: Family Medicine

## 2022-08-07 DIAGNOSIS — Z0000 Encounter for general adult medical examination without abnormal findings: Secondary | ICD-10-CM | POA: Diagnosis not present

## 2022-08-07 NOTE — Patient Instructions (Signed)
[place] Maintenance Summary and Written Plan of Care  Ms. Jamerson ,  Thank you for allowing me to perform your Medicare Annual Wellness Visit and for your ongoing commitment to your health.   Health Maintenance & Immunization History Health Maintenance  Topic Date Due   INFLUENZA VACCINE  10/18/2022 (Originally 02/17/2022)   COVID-19 Vaccine (1) 02/21/2023 (Originally 11/20/1950)   Zoster Vaccines- Shingrix (1 of 2) 08/15/2023 (Originally 11/21/1964)   MAMMOGRAM  10/25/2022   HEMOGLOBIN A1C  11/13/2022   Diabetic kidney evaluation - Urine ACR  02/10/2023   OPHTHALMOLOGY EXAM  05/14/2023   FOOT EXAM  05/15/2023   Diabetic kidney evaluation - eGFR measurement  05/19/2023   Medicare Annual Wellness (AWV)  08/08/2023   DEXA SCAN  10/07/2026   Pneumonia Vaccine 41+ Years old  Completed   Hepatitis C Screening  Completed   HPV VACCINES  Aged Out   DTaP/Tdap/Td  Discontinued   COLONOSCOPY (Pts 45-64yr Insurance coverage will need to be confirmed)  Discontinued   Immunization History  Administered Date(s) Administered   Fluad Quad(high Dose 65+) 05/04/2019, 04/30/2020   Influenza, High Dose Seasonal PF 04/13/2017, 05/24/2018   Influenza, Seasonal, Injecte, Preservative Fre 06/10/2011, 04/21/2012   Influenza,inj,Quad PF,6+ Mos 04/20/2013, 03/27/2016   Influenza-Unspecified 04/29/2015   Pneumococcal Conjugate-13 07/23/2014, 12/26/2015, 04/20/2016   Pneumococcal Polysaccharide-23 11/28/2009   Zoster, Live 04/25/2012    These are the patient goals that we discussed:  Goals Addressed               This Visit's Progress     Patient Stated (pt-stated)        Patient stated that she would like to work on getting off of some of the medications she is on.         This is a list of Health Maintenance Items that are overdue or due now: Influenza vaccine Shingles vaccine  Patient declined the vaccines at this vaccine.    Orders/Referrals Placed  Today: No orders of the defined types were placed in this encounter.  (Contact our referral department at [phone] you have not spoken with someone about your referral appointment within the next 5 days)    Follow-up Plan Follow-up with MHali Marry MD as planned Medicare wellness visit in one year. AVS printed and mailed to the patient.      Health Maintenance, Female Adopting a healthy lifestyle and getting preventive care are important in promoting health and wellness. Ask your health care provider about: The right schedule for you to have regular tests and exams. Things you can do on your own to prevent diseases and keep yourself healthy. What should I know about diet, weight, and exercise? Eat a healthy diet  Eat a diet that includes plenty of vegetables, fruits, low-fat dairy products, and lean protein. Do not eat a lot of foods that are high in solid fats, added sugars, or sodium. Maintain a healthy weight Body mass index (BMI) is used to identify weight problems. It estimates body fat based on height and weight. Your health care provider can help determine your BMI and help you achieve or maintain a healthy weight. Get regular exercise Get regular exercise. This is one of the most important things you can do for your health. Most adults should: Exercise for at least 150 minutes each week. The exercise should increase your heart rate and make you sweat (moderate-intensity exercise). Do strengthening exercises at least twice a week. This is in addition  to the moderate-intensity exercise. Spend less time sitting. Even light physical activity can be beneficial. Watch cholesterol and blood lipids Have your blood tested for lipids and cholesterol at 79 years of age, then have this test every 5 years. Have your cholesterol levels checked more often if: Your lipid or cholesterol levels are high. You are older than 79 years of age. You are at high risk for heart  disease. What should I know about cancer screening? Depending on your health history and family history, you may need to have cancer screening at various ages. This may include screening for: Breast cancer. Cervical cancer. Colorectal cancer. Skin cancer. Lung cancer. What should I know about heart disease, diabetes, and high blood pressure? Blood pressure and heart disease High blood pressure causes heart disease and increases the risk of stroke. This is more likely to develop in people who have high blood pressure readings or are overweight. Have your blood pressure checked: Every 3-5 years if you are 60-72 years of age. Every year if you are 74 years old or older. Diabetes Have regular diabetes screenings. This checks your fasting blood sugar level. Have the screening done: Once every three years after age 35 if you are at a normal weight and have a low risk for diabetes. More often and at a younger age if you are overweight or have a high risk for diabetes. What should I know about preventing infection? Hepatitis B If you have a higher risk for hepatitis B, you should be screened for this virus. Talk with your health care provider to find out if you are at risk for hepatitis B infection. Hepatitis C Testing is recommended for: Everyone born from [birth year] through 1967. Anyone with known risk factors for hepatitis C. Sexually transmitted infections (STIs) Get screened for STIs, including gonorrhea and chlamydia, if: You are sexually active and are younger than 79 years of age. You are older than 79 years of age and your health care provider tells you that you are at risk for this type of infection. Your sexual activity has changed since you were last screened, and you are at increased risk for chlamydia or gonorrhea. Ask your health care provider if you are at risk. Ask your health care provider about whether you are at high risk for HIV. Your health care provider may recommend a  prescription medicine to help prevent HIV infection. If you choose to take medicine to prevent HIV, you should first get tested for HIV. You should then be tested every 3 months for as long as you are taking the medicine. Pregnancy If you are about to stop having your period (premenopausal) and you may become pregnant, seek counseling before you get pregnant. Take 400 to 800 micrograms (mcg) of folic acid every day if you become pregnant. Ask for birth control (contraception) if you want to prevent pregnancy. Osteoporosis and menopause Osteoporosis is a disease in which the bones lose minerals and strength with aging. This can result in bone fractures. If you are 90 years old or older, or if you are at risk for osteoporosis and fractures, ask your health care provider if you should: Be screened for bone loss. Take a calcium or vitamin D supplement to lower your risk of fractures. Be given hormone replacement therapy (HRT) to treat symptoms of menopause. Follow these instructions at home: Alcohol use Do not drink alcohol if: Your health care provider tells you not to drink. You are pregnant, may be pregnant, or are planning to  become pregnant. If you drink alcohol: Limit how much you have to: 0-1 drink a day. Know how much alcohol is in your drink. In the [place] one drink equals one 12 oz bottle of beer (355 mL), one 5 oz glass of wine (148 mL), or one 1 oz glass of hard liquor (44 mL). Lifestyle Do not use any products that contain nicotine or tobacco. These products include cigarettes, chewing tobacco, and vaping devices, such as e-cigarettes. If you need help quitting, ask your health care provider. Do not use street drugs. Do not share needles. Ask your health care provider for help if you need support or information about quitting drugs. General instructions Schedule regular health, dental, and eye exams. Stay current with your vaccines. Tell your health care provider if: You often  feel depressed. You have ever been abused or do not feel safe at home. Summary Adopting a healthy lifestyle and getting preventive care are important in promoting health and wellness. Follow your health care provider's instructions about healthy diet, exercising, and getting tested or screened for diseases. Follow your health care provider's instructions on monitoring your cholesterol and blood pressure. This information is not intended to replace advice given to you by your health care provider. Make sure you discuss any questions you have with your health care provider. Document Revised: 11/25/2020 Document Reviewed: 11/25/2020 Elsevier Patient Education  [place].

## 2022-08-07 NOTE — Progress Notes (Signed)
MEDICARE ANNUAL WELLNESS VISIT  08/07/2022  Telephone Visit Disclaimer This Medicare AWV was conducted by telephone due to national recommendations for restrictions regarding the COVID-19 Pandemic (e.g. social distancing).  I verified, using two identifiers, that I am speaking with Yolanda Fields or their authorized healthcare agent. I discussed the limitations, risks, security, and privacy concerns of performing an evaluation and management service by telephone and the potential availability of an in-person appointment in the future. The patient expressed understanding and agreed to proceed.  Location of Patient: Home Location of Provider (nurse):  In the office.  Subjective:    Yolanda Fields is a 77 y.o. female patient of Metheney, Rene Kocher, MD who had a Medicare Annual Wellness Visit today via telephone. Yolanda Fields is Retired and lives alone. she has 4 children. she reports that she is socially active and does interact with friends/family regularly. she is minimally physically active and enjoys watching basketball.  Patient Care Team: [name], MD as PCP - General (Family Medicine) Francia Greaves, DO as Referring Physician (Ophthalmology)     08/07/2022    8:12 AM 09/11/2020    8:20 AM 09/11/2019   11:15 AM 09/07/2018    9:06 AM 09/24/2016   10:23 AM  Advanced Directives  Does Patient Have a Medical Advance Directive? No No No No No  Would patient like information on creating a medical advance directive? No - Patient declined No - Patient declined Yes (MAU/Ambulatory/Procedural Areas - Information given) No - Patient declined No - Patient declined    Hospital Utilization Over the Past 12 Months: # of hospitalizations or ER visits: 2 # of surgeries: 1  Review of Systems    Patient reports that her overall health is unchanged compared to last year.  History obtained from chart review and the patient  Patient Reported Readings (BP, Pulse, CBG, Weight, etc) none  Pain  Assessment Pain : No/denies pain     Current Medications & Allergies (verified) Allergies as of 08/07/2022       Reactions   Tetanus Toxoids Palpitations   Amoxicillin-pot Clavulanate Swelling        Medication List        Accurate as of August 07, 2022  8:24 AM. If you have any questions, ask your nurse or doctor.          AMBULATORY NON FORMULARY MEDICATION Medication Name: True metrix glucometer and strips.  Dx E11.9 for Testing daily.   amLODipine 10 MG tablet Commonly known as: NORVASC Take 1 tablet (10 mg total) by mouth daily.   atorvastatin 10 MG tablet Commonly known as: LIPITOR Take 1 tablet (10 mg total) by mouth at bedtime.   cetirizine 10 MG tablet Commonly known as: ZYRTEC Take 1 tablet (10 mg total) by mouth daily.   Cholecalciferol 50 MCG (2000 UT) Caps Take by mouth.   ciclopirox 8 % solution Commonly known as: Penlac Apply topically at bedtime. Apply over nail and surrounding skin. Apply daily over previous coat. After seven (7) days, may remove with alcohol and continue cycle.   Cinnamon Bark Powd by Does not apply route.   cyanocobalamin 1000 MCG tablet Take 1,000 mcg by mouth daily.   esomeprazole 40 MG capsule Commonly known as: NEXIUM TAKE ONE CAPSULE BY MOUTH TWO TIMES A DAY BEFORE A MEAL.   fluticasone 50 MCG/ACT nasal spray Commonly known as: FLONASE Place 1-2 sprays into both nostrils daily.   furosemide 40 MG tablet Commonly known as: LASIX Take 1 tablet (  40 mg total) by mouth daily as needed.   Lancets Misc To be used to check blood sugars up to four (4) times daily. DX: E11.29 ([facility])   Levemir FlexPen 100 UNIT/ML FlexPen Generic drug: insulin detemir Inject 30 Units into the skin at bedtime.   levothyroxine 100 MCG tablet Commonly known as: SYNTHROID Take 1 tablet (100 mcg total) by mouth daily.   losartan 100 MG tablet Commonly known as: COZAAR TAKE 1 TABLET BY MOUTH ONCE DAILY   Mounjaro 7.5  MG/0.5ML Pen Generic drug: tirzepatide Inject 7.5 mg into the skin once a week.   multivitamin tablet Take 1 tablet by mouth daily.   Sure Comfort Pen Needles 32G X 4 MM Misc Generic drug: Insulin Pen Needle USE AS DIRECTED   Synjardy XR 25-1000 MG Tb24 Generic drug: Empagliflozin-metFORMIN HCl ER Take 1 tablet by mouth daily.   Travoprost (BAK Free) 0.004 % Soln ophthalmic solution Commonly known as: TRAVATAN SMARTSIG:In Eye(s)   True Metrix Blood Glucose Test test strip Generic drug: glucose blood TEST DAILY. Dx E11.29([facility])        History (reviewed): Past Medical History:  Diagnosis Date   Diabetes mellitus without complication ([place])    Hyperlipidemia    Hypertension    Past Surgical History:  Procedure Laterality Date   CESAREAN SECTION     TYMPANOSTOMY TUBE PLACEMENT Right 2023   Family History  Problem Relation Age of Onset   Bladder Cancer Brother    Cancer Sister    Heart failure Mother 60   Diabetes Mother    Heart failure Brother    Diabetes Brother    Pancreatic cancer Brother    Diabetes Sister    Coronary artery disease Father 20   Heart attack Father    Social History   Socioeconomic History   Marital status: Divorced    Spouse name: Not on file   Number of children: 4   Years of education: HS   Highest education level: 12th grade  Occupational History   Occupation: Retired from [employer]   Tobacco Use   Smoking status: Never   Smokeless tobacco: Never  Vaping Use   Vaping Use: Never used  Substance and Sexual Activity   Alcohol use: No    Alcohol/week: 0.0 standard drinks of alcohol   Drug use: No   Sexual activity: Not Currently  Other Topics Concern   Not on file  Social History Narrative   Patient is retired from [employer].  consumes 2 caffeine drinks daily and rates her diet as good. Walks 2-3 times a week for 20 minutes. Enjoys watching basketball. She use to enjoy riding her bicycle but she hasn't been able to lately.         Social Determinants of Health   Financial Resource Strain: Low Risk  (08/07/2022)   Overall Financial Resource Strain (CARDIA)    Difficulty of Paying Living Expenses: Not hard at all  Food Insecurity: No Food Insecurity (08/07/2022)   Hunger Vital Sign    Worried About Running Out of Food in the Last Year: Never true    Ran Out of Food in the Last Year: Never true  Transportation Needs: No Transportation Needs (08/07/2022)   PRAPARE - Hydrologist (Medical): No    Lack of Transportation (Non-Medical): No  Physical Activity: Inactive (08/07/2022)   Exercise Vital Sign    Days of Exercise per Week: 0 days    Minutes of Exercise per Session: 0  min  Stress: No Stress Concern Present (08/07/2022)   [place]    Feeling of Stress : Not at all  Social Connections: Moderately Isolated (08/07/2022)   Social Connection and Isolation Panel [NHANES]    Frequency of Communication with Friends and Family: More than three times a week    Frequency of Social Gatherings with Friends and Family: Three times a week    Attends Religious Services: Never    Active Member of Clubs or Organizations: Yes    Attends Archivist Meetings: More than 4 times per year    Marital Status: Divorced    Activities of Daily Living    08/07/2022    8:18 AM  In your present state of health, do you have any difficulty performing the following activities:  Hearing? 1  Vision? 0  Difficulty concentrating or making decisions? 0  Walking or climbing stairs? 0  Dressing or bathing? 0  Doing errands, shopping? 0  Preparing Food and eating ? N  Using the Toilet? N  In the past six months, have you accidently leaked urine? N  Do you have problems with loss of bowel control? N  Managing your Medications? N  Managing your Finances? N  Housekeeping or managing your Housekeeping? N    Patient Education/  Literacy How often do you need to have someone help you when you read instructions, pamphlets, or other written materials from your doctor or pharmacy?: 1 - Never What is the last grade level you completed in school?: some college  Exercise Current Exercise Habits: The patient does not participate in regular exercise at present, Exercise limited by: None identified  Diet Patient reports consuming 3 meals a day and 0-1 snack(s) a day Patient reports that her primary diet is: Regular Patient reports that she does have regular access to food.   Depression Screen    08/07/2022    8:12 AM 05/14/2022    8:53 AM 11/06/2021   10:26 AM 08/07/2021   10:30 AM 02/04/2021    9:19 AM 11/05/2020    9:22 AM 09/11/2020    8:20 AM  PHQ 2/9 Scores  PHQ - 2 Score 0 0 0 0 0 0 0  PHQ- 9 Score       0     Fall Risk    08/07/2022    8:12 AM 05/14/2022    8:53 AM 11/06/2021   10:26 AM 08/07/2021   10:30 AM 02/04/2021    9:19 AM  Fall Risk   Falls in the past year? 0 0 0 0 0  Number falls in past yr: 0 0 0 0 0  Injury with Fall? 0 0 0 0 0  Risk for fall due to : No Fall Risks No Fall Risks No Fall Risks No Fall Risks   Follow up Falls evaluation completed Follow up appointment Falls prevention discussed;Falls evaluation completed Falls prevention discussed;Falls evaluation completed      Objective:  Yolanda Fields seemed alert and oriented and she participated appropriately during our telephone visit.  Blood Pressure Weight BMI  BP Readings from Last 3 Encounters:  05/14/22 139/61  02/09/22 (!) 136/53  01/23/22 (!) 169/77   Wt Readings from Last 3 Encounters:  05/14/22 146 lb (66.2 kg)  02/09/22 149 lb (67.6 kg)  01/23/22 140 lb (63.5 kg)   BMI Readings from Last 1 Encounters:  05/14/22 28.51 kg/m    *Unable to obtain current vital signs, weight, and  BMI due to telephone visit type  Hearing/Vision  Yolanda Fields did not seem to have difficulty with hearing/understanding during the telephone  conversation Reports that she has had a formal eye exam by an eye care professional within the past year Reports that she has had a formal hearing evaluation within the past year *Unable to fully assess hearing and vision during telephone visit type  Cognitive Function:    08/07/2022    8:20 AM 09/11/2020    8:28 AM 09/11/2019   11:22 AM 09/07/2018    9:10 AM 09/24/2016   10:22 AM  6CIT Screen  What Year? 0 points 0 points 0 points 0 points 0 points  What month? 0 points 0 points 0 points 0 points 0 points  What time? 0 points 0 points 0 points 0 points 0 points  Count back from 20 0 points 0 points 0 points 0 points 0 points  Months in reverse 0 points 0 points 0 points 0 points 0 points  Repeat phrase 0 points 0 points 0 points 2 points 0 points  Total Score 0 points 0 points 0 points 2 points 0 points   (Normal:0-7, Significant for Dysfunction: >8)  Normal Cognitive Function Screening: Yes   Immunization & Health Maintenance Record Immunization History  Administered Date(s) Administered   Fluad Quad(high Dose 65+) 05/04/2019, 04/30/2020   Influenza, High Dose Seasonal PF 04/13/2017, 05/24/2018   Influenza, Seasonal, Injecte, Preservative Fre 06/10/2011, 04/21/2012   Influenza,inj,Quad PF,6+ Mos 04/20/2013, 03/27/2016   Influenza-Unspecified 04/29/2015   Pneumococcal Conjugate-13 07/23/2014, 12/26/2015, 04/20/2016   Pneumococcal Polysaccharide-23 11/28/2009   Zoster, Live 04/25/2012    Health Maintenance  Topic Date Due   INFLUENZA VACCINE  10/18/2022 (Originally 02/17/2022)   COVID-19 Vaccine (1) 02/21/2023 (Originally 11/20/1950)   Zoster Vaccines- Shingrix (1 of 2) 08/15/2023 (Originally 11/21/1964)   MAMMOGRAM  10/25/2022   HEMOGLOBIN A1C  11/13/2022   Diabetic kidney evaluation - Urine ACR  02/10/2023   OPHTHALMOLOGY EXAM  05/14/2023   FOOT EXAM  05/15/2023   Diabetic kidney evaluation - eGFR measurement  05/19/2023   Medicare Annual Wellness (AWV)  08/08/2023   DEXA  SCAN  10/07/2026   Pneumonia Vaccine 58+ Years old  Completed   Hepatitis C Screening  Completed   HPV VACCINES  Aged Out   DTaP/Tdap/Td  Discontinued   COLONOSCOPY (Pts 45-6yr Insurance coverage will need to be confirmed)  Discontinued       Assessment  This is a routine wellness examination for Yolanda Fields  Health Maintenance: Due or Overdue There are no preventive care reminders to display for this patient.   Yolanda Titsworthdoes not need a referral for Community Assistance: Care Management:   no Social Work:    no Prescription Assistance:  no Nutrition/Diabetes Education:  no   Plan:  Personalized Goals  Goals Addressed               This Visit's Progress     Patient Stated (pt-stated)        Patient stated that she would like to work on getting off of some of the medications she is on.       Personalized Health Maintenance & Screening Recommendations  Influenza vaccine Shingles vaccine  Patient declined the vaccines at this vaccine.  Lung Cancer Screening Recommended: no (Low Dose CT Chest recommended if Age 79-80years, 30 pack-year currently smoking OR have quit w/in past 15 years) Hepatitis C Screening recommended: no HIV Screening recommended: no  Advanced Directives: Written  information was not prepared per patient's request.  Referrals & Orders No orders of the defined types were placed in this encounter.   Follow-up Plan Follow-up with [name], MD as planned Medicare wellness visit in one year. AVS printed and mailed to the patient.   I have personally reviewed and noted the following in the patient's chart:   Medical and social history Use of alcohol, tobacco or illicit drugs  Current medications and supplements Functional ability and status Nutritional status Physical activity Advanced directives List of other physicians Hospitalizations, surgeries, and ER visits in previous 12 months Vitals Screenings to include  cognitive, depression, and falls Referrals and appointments  In addition, I have reviewed and discussed with Yolanda Fields certain preventive protocols, quality metrics, and best practice recommendations. A written personalized care plan for preventive services as well as general preventive health recommendations is available and can be mailed to the patient at her request.      Tinnie Gens, RN BSN  08/07/2022

## 2022-08-17 ENCOUNTER — Telehealth: Payer: Self-pay

## 2022-08-17 ENCOUNTER — Encounter: Payer: Self-pay | Admitting: Family Medicine

## 2022-08-17 ENCOUNTER — Ambulatory Visit (INDEPENDENT_AMBULATORY_CARE_PROVIDER_SITE_OTHER): Payer: 59 | Admitting: Family Medicine

## 2022-08-17 VITALS — BP 147/81 | HR 75 | Ht 60.0 in | Wt 149.0 lb

## 2022-08-17 DIAGNOSIS — E038 Other specified hypothyroidism: Secondary | ICD-10-CM

## 2022-08-17 DIAGNOSIS — E1129 Type 2 diabetes mellitus with other diabetic kidney complication: Secondary | ICD-10-CM | POA: Diagnosis not present

## 2022-08-17 DIAGNOSIS — R809 Proteinuria, unspecified: Secondary | ICD-10-CM

## 2022-08-17 DIAGNOSIS — N1832 Chronic kidney disease, stage 3b: Secondary | ICD-10-CM

## 2022-08-17 DIAGNOSIS — Z794 Long term (current) use of insulin: Secondary | ICD-10-CM

## 2022-08-17 DIAGNOSIS — E1159 Type 2 diabetes mellitus with other circulatory complications: Secondary | ICD-10-CM | POA: Diagnosis not present

## 2022-08-17 DIAGNOSIS — I10 Essential (primary) hypertension: Secondary | ICD-10-CM

## 2022-08-17 LAB — POCT GLYCOSYLATED HEMOGLOBIN (HGB A1C): HbA1c POC (<> result, manual entry): 8 % (ref 4.0–5.6)

## 2022-08-17 MED ORDER — TIRZEPATIDE 10 MG/0.5ML ~~LOC~~ SOAJ: 10.0000 mg | SUBCUTANEOUS | 1 refills | Status: DC

## 2022-08-17 MED ORDER — BASAGLAR KWIKPEN 100 UNIT/ML ~~LOC~~ SOPN: 26.0000 [IU] | PEN_INJECTOR | Freq: Every day | SUBCUTANEOUS | 1 refills | Status: DC

## 2022-08-17 MED ORDER — LEVOTHYROXINE SODIUM 100 MCG PO TABS: 100.0000 ug | ORAL_TABLET | Freq: Every day | ORAL | 1 refills | Status: DC

## 2022-08-17 NOTE — Patient Instructions (Addendum)
Decrease Levemir to 28 units daily.  Once you run out at the end of the month then start the new long-acting insulin at 26 units daily. Will increase Mounjaro to 10 mg.  Use up your current 7.5 mg dose.  She has about 3 more weeks left and then will switch to 10 mg.  Goal is to eventually wean you off of the insulin completely.

## 2022-08-17 NOTE — Telephone Encounter (Addendum)
Initiated Prior authorization HWY:[id number] 7.'5MG'$ /0.5ML pen-injectors Via: Covermymeds Case/Key:[id number] Status: approved  as of 08/17/22 Reason: approved through 07/20/2023. Notified Pt via: pt does not have Mychart, pt was called detailed vm was left in regards to the approval

## 2022-08-17 NOTE — Progress Notes (Signed)
Established Patient Office Visit  Subjective   Patient ID: Yolanda Fields, female    DOB: Nov 25, 1945  Age: 77 y.o. MRN: [id number]  Chief Complaint  Patient presents with   Follow-up    HPI Diabetes - no hypoglycemic events. No wounds or sores that are not healing well. No increased thirst or urination. Checking glucose at home. Taking medications as prescribed without any side effects.  She is going to need a new authorization for her Mounjaro.  She also received notification that her Levemir is no longer going to be manufactured and will need to be changed as well. She says she really did well over the holiday a major change as she does not feel like she is eating more than usual.  Currently on Mounjaro 7.5 mg.  She says she still taking the [place] regularly.  Hypertension- Pt denies chest pain, SOB, dizziness, or heart palpitations.  Taking meds as directed w/o problems.  Denies medication side effects.       ROS    Objective:     BP (!) 147/81   Pulse 75   Ht 5' (1.524 m)   Wt 149 lb (67.6 kg)   SpO2 98%   BMI 29.10 kg/m    Physical Exam Vitals and nursing note reviewed.  Constitutional:      Appearance: She is well-developed.  HENT:     Head: Normocephalic and atraumatic.  Cardiovascular:     Rate and Rhythm: Normal rate and regular rhythm.     Heart sounds: Normal heart sounds.  Pulmonary:     Effort: Pulmonary effort is normal.     Breath sounds: Normal breath sounds.  Skin:    General: Skin is warm and dry.  Neurological:     Mental Status: She is alert and oriented to person, place, and time.  Psychiatric:        Behavior: Behavior normal.      Results for orders placed or performed in visit on 08/17/22  POCT HgB A1C  Result Value Ref Range   Hemoglobin A1C (A)    HbA1c POC (<> result, manual entry) 8.0 4.0 - 5.6 %   HbA1c, POC (prediabetic range)     HbA1c, POC (controlled diabetic range)        The ASCVD Risk score (Arnett DK, et al., 2019)  failed to calculate for the following reasons:   The valid total cholesterol range is 130 to 320 mg/dL    Assessment & Plan:   Problem List Items Addressed This Visit       Cardiovascular and Mediastinum   Type 2 diabetes mellitus with vascular disease ([place]) - Primary   Relevant Medications   Insulin Glargine (BASAGLAR KWIKPEN) 100 UNIT/ML   tirzepatide (MOUNJARO) 10 MG/0.5ML Pen   Other Relevant Orders   POCT HgB A1C (Completed)   Hemoglobin G3T   BASIC METABOLIC PANEL WITH GFR   Essential hypertension    Blood pressure at goal at last office visit but borderline elevated today.  Will monitor before making any adjustments.  Please make sure taking medications regularly.      Relevant Orders   Hemoglobin D1V   BASIC METABOLIC PANEL WITH GFR     Endocrine   Hypothyroidism   Relevant Medications   levothyroxine (SYNTHROID) 100 MCG tablet   Other Relevant Orders   TSH   Controlled type 2 diabetes mellitus with microalbuminuria, with long-term current use of insulin (HCC)    A1c jumped up significantly completely compared to  prior A1c.  She really does not feel like she is made any major changes.  So we will get a venipuncture to confirm.  She has had several blood sugars in the 80s and 90s and 1 in the 70s.  Decrease Levemir to 28 units daily.  Once you run out at the end of the month then start the new long-acting insulin at 26 units daily. Will increase Mounjaro to 10 mg.  Use up your current 7.5 mg dose.  She has about 3 more weeks left and then will switch to 10 mg.  Goal is to eventually wean you off of the insulin completely.      Relevant Medications   Insulin Glargine (BASAGLAR KWIKPEN) 100 UNIT/ML   tirzepatide (MOUNJARO) 10 MG/0.5ML Pen   Other Relevant Orders   Hemoglobin N4B   BASIC METABOLIC PANEL WITH GFR     Genitourinary   Chronic kidney disease (CKD) stage G3b/A1, moderately decreased glomerular filtration rate (GFR) between 30-44 mL/min/1.73 square meter  and albuminuria creatinine ratio less than 30 mg/g (HCC)    Due to recheck renal function.       Relevant Orders   Hemoglobin S9G   BASIC METABOLIC PANEL WITH GFR    Return in about 2 months (around 10/16/2022) for Diabetes follow-up.    [name], MD

## 2022-08-17 NOTE — Assessment & Plan Note (Addendum)
A1c jumped up significantly completely compared to prior A1c.  She really does not feel like she is made any major changes.  So we will get a venipuncture to confirm.  She has had several blood sugars in the 80s and 90s and 1 in the 70s.  Decrease Levemir to 28 units daily.  Once you run out at the end of the month then start the new long-acting insulin at 26 units daily. Will increase Mounjaro to 10 mg.  Use up your current 7.5 mg dose.  She has about 3 more weeks left and then will switch to 10 mg.  Goal is to eventually wean you off of the insulin completely.

## 2022-08-17 NOTE — Assessment & Plan Note (Signed)
Due to recheck renal function. 

## 2022-08-17 NOTE — Assessment & Plan Note (Signed)
Blood pressure at goal at last office visit but borderline elevated today.  Will monitor before making any adjustments.  Please make sure taking medications regularly.

## 2022-08-18 LAB — BASIC METABOLIC PANEL WITH GFR
BUN/Creatinine Ratio: 28 (calc) — ABNORMAL HIGH (ref 6–22)
BUN: 27 mg/dL — ABNORMAL HIGH (ref 7–25)
CO2: 26 mmol/L (ref 20–32)
Calcium: 9.4 mg/dL (ref 8.6–10.4)
Chloride: 103 mmol/L (ref 98–110)
Creat: 0.97 mg/dL (ref 0.60–1.00)
Glucose, Bld: 111 mg/dL — ABNORMAL HIGH (ref 65–99)
Potassium: 4.5 mmol/L (ref 3.5–5.3)
Sodium: 140 mmol/L (ref 135–146)
eGFR: 60 mL/min/{1.73_m2} (ref >=60)

## 2022-08-18 LAB — HEMOGLOBIN A1C
Hgb A1c MFr Bld: 8 % of total Hgb — ABNORMAL HIGH (ref <5.7)
Mean Plasma Glucose: 183 mg/dL
eAG (mmol/L): 10.1 mmol/L

## 2022-08-18 LAB — TSH: TSH: 6.59 mIU/L — ABNORMAL HIGH (ref 0.40–4.50)

## 2022-08-18 NOTE — Progress Notes (Signed)
Hi Lemma, your A1c was 8.0 it did jump up significantly. I think increasing your mounjaro.   Kidney function is stable.  Thyroid level is off.  We we will need to make an adjustment to your regimen unless you have been out of your medication or missed some doses recently.  If you have been taking it very consistently at least an hour away from food or other medications on empty stomach at least 4 hours away from vitamins or supplements then please let me know so that I can adjust her dose and then we can recheck your levels in 6 weeks.

## 2022-08-25 ENCOUNTER — Other Ambulatory Visit: Payer: Self-pay | Admitting: *Deleted

## 2022-08-25 DIAGNOSIS — E038 Other specified hypothyroidism: Secondary | ICD-10-CM

## 2022-08-25 NOTE — Progress Notes (Signed)
Okay, recommend that she take an extra half a tab weekly.  So for example Monday through Saturday she would take a whole tab daily and then on Sundays she will take 1-1/2 tabs.  This would only increase her weekly dose by 50 mcg.  Will plan to recheck her TSH again in about 8 weeks.

## 2022-09-08 ENCOUNTER — Other Ambulatory Visit: Payer: Self-pay | Admitting: Family Medicine

## 2022-09-11 ENCOUNTER — Telehealth: Payer: Self-pay

## 2022-09-11 DIAGNOSIS — E1159 Type 2 diabetes mellitus with other circulatory complications: Secondary | ICD-10-CM

## 2022-09-11 NOTE — Telephone Encounter (Signed)
Yolanda Fields from [facility] called stating that pt's insurance will no longer pay for [employer]. They will pay for Lantus solostar. He asked for this to be sent in for her in place of basaglar.

## 2022-09-14 MED ORDER — LANTUS SOLOSTAR 100 UNIT/ML ~~LOC~~ SOPN: 24.0000 [IU] | PEN_INJECTOR | Freq: Every day | SUBCUTANEOUS | 99 refills | Status: DC

## 2022-09-14 NOTE — Telephone Encounter (Signed)
Meds ordered this encounter  Medications   insulin glargine (LANTUS SOLOSTAR) 100 UNIT/ML Solostar Pen    Sig: Inject 24-28 Units into the skin at bedtime.    Dispense:  15 mL    Refill:  PRN

## 2022-09-18 NOTE — Telephone Encounter (Signed)
Attempted to contact patient to inform of medication change being sent. Left voice mail message requesting a return call.

## 2022-09-23 NOTE — Telephone Encounter (Signed)
Attempted call again to patient. Left voice mail message requesting a return call.

## 2022-09-25 ENCOUNTER — Ambulatory Visit (INDEPENDENT_AMBULATORY_CARE_PROVIDER_SITE_OTHER): Payer: 59 | Admitting: Podiatry

## 2022-09-25 DIAGNOSIS — Z91199 Patient's noncompliance with other medical treatment and regimen due to unspecified reason: Secondary | ICD-10-CM

## 2022-09-25 NOTE — Progress Notes (Signed)
No show

## 2022-09-29 NOTE — Telephone Encounter (Signed)
Called pharmacy - spoke with nick- patient picked up the Lantus solostar about 8 days ago and the reason for the change of prescription was explained to patient at this time.

## 2022-09-30 ENCOUNTER — Ambulatory Visit: Payer: Self-pay | Admitting: Licensed Clinical Social Worker

## 2022-09-30 NOTE — Patient Instructions (Signed)
Visit Information  Thank you for taking time to visit with me today. Please don't hesitate to contact me if I can be of assistance to you.   Following are the goals we discussed today:   Goals Addressed             This Visit's Progress    Connect with RN Care Manager to manage Diabetes       Activities and task to complete in order to accomplish goals.   I have scheduled a phone appointment with the [place] she will provide educational informational and assist you with managing your health needs related to diabetes       Your next appointment is by telephone on 10/26/22 with the [facility]  Please call the care guide team at [phone] if you need to cancel or reschedule your appointment.    The patient verbalized understanding of instructions, educational materials, and care plan provided today and DECLINED offer to receive copy of patient instructions, educational materials, and care plan.   Casimer Lanius, [place] [phone]

## 2022-09-30 NOTE — Patient Outreach (Signed)
  Care Coordination  Initial Visit Note   09/30/2022 Name: Yolanda Fields MRN: [id number] DOB: [birth year]  Yolanda Fields is a 77 y.o. year old female who sees Metheney, Rene Kocher, MD for primary care. I spoke with  Yolanda Fields by phone today.  What matters to the patients health and wellness today?  Managing her diabetes    Goals Addressed             This Visit's Progress    Connect with RN Care Manager to manage Diabetes       Activities and task to complete in order to accomplish goals.   I have scheduled a phone appointment with the RN Care Manager she will provide educational informational and assist you with managing your health needs related to diabetes       SDOH assessments and interventions completed:  Yes  SDOH Interventions Today    Flowsheet Row Most Recent Value  SDOH Interventions   Food Insecurity Interventions Intervention Not Indicated  Transportation Interventions Intervention Not Indicated  Utilities Interventions Intervention Not Indicated  Stress Interventions Intervention Not Indicated       Care Coordination Interventions:  Yes, provided  Interventions Today    Flowsheet Row Most Recent Value  Chronic Disease   Chronic disease during today's visit Diabetes, Hypertension (HTN)  General Interventions   General Interventions Discussed/Reviewed General Interventions Discussed, Referral to Nurse  [facility] Care Coordination Program]       Follow up plan:  No further intervention required for LCSW Follow up call scheduled for RN Care Manager  Encounter Outcome:  Pt. Visit Completed   Casimer Lanius, [place] [phone]

## 2022-10-07 ENCOUNTER — Other Ambulatory Visit: Payer: Self-pay | Admitting: Family Medicine

## 2022-10-14 ENCOUNTER — Telehealth: Payer: Self-pay

## 2022-10-14 MED ORDER — TIRZEPATIDE 7.5 MG/0.5ML ~~LOC~~ SOAJ: 7.5000 mg | SUBCUTANEOUS | 0 refills | Status: DC

## 2022-10-14 NOTE — Telephone Encounter (Signed)
Patient called. States she is currently on Mounjaro 10mg  but  this is on backorder.  Mounjaro 7.5mg  is availble  at [facility] [address]  Can 7.5mg  be sent for her instead ?

## 2022-10-14 NOTE — Telephone Encounter (Signed)
Medication sent for 7.5 mg.  Please pend pharmacy.

## 2022-10-14 NOTE — Telephone Encounter (Signed)
Patient informed. 

## 2022-10-19 ENCOUNTER — Ambulatory Visit: Payer: 59 | Admitting: Family Medicine

## 2022-10-21 ENCOUNTER — Ambulatory Visit (INDEPENDENT_AMBULATORY_CARE_PROVIDER_SITE_OTHER): Payer: 59 | Admitting: Family Medicine

## 2022-10-21 ENCOUNTER — Encounter: Payer: Self-pay | Admitting: Family Medicine

## 2022-10-21 VITALS — BP 138/41 | HR 82 | Temp 97.9°F | Resp 20 | Ht 60.0 in | Wt 149.8 lb

## 2022-10-21 DIAGNOSIS — E038 Other specified hypothyroidism: Secondary | ICD-10-CM | POA: Diagnosis not present

## 2022-10-21 DIAGNOSIS — I10 Essential (primary) hypertension: Secondary | ICD-10-CM | POA: Diagnosis not present

## 2022-10-21 DIAGNOSIS — E1159 Type 2 diabetes mellitus with other circulatory complications: Secondary | ICD-10-CM

## 2022-10-21 MED ORDER — LOSARTAN POTASSIUM 100 MG PO TABS: 100.0000 mg | ORAL_TABLET | Freq: Every day | ORAL | 1 refills | Status: DC

## 2022-10-21 MED ORDER — AMLODIPINE BESYLATE 5 MG PO TABS: 5.0000 mg | ORAL_TABLET | Freq: Every day | ORAL | 3 refills | Status: DC

## 2022-10-21 NOTE — Progress Notes (Signed)
Established Patient Office Visit  Subjective   Patient ID: Yolanda Fields, female    DOB: Nov 06, 1945  Age: 77 y.o. MRN: [id number]  Chief Complaint  Patient presents with   Follow-up    2 month follow up for diabetes . Patient states that she is not taking the [facility] 10mg  she is taking the 7.5mg . Patient also states that she is taking the Lantus Solostar 100unit/ml. Patient does not have any complaints regarding medication A1c=7.3%    HPI   Diabetes - no hypoglycemic events. No wounds or sores that are not healing well. No increased thirst or urination. Checking glucose at home. Taking medications as prescribed without any side effects. 2 month follow up for diabetes . Patient states that she is not taking the [facility] 10mg  she is taking the 7.5mg .  She was previously on 10 g but could not find it in stock and was able to find the 7.5 mg at [facility] so we had sent in a 90-day supply and she was able to get the full prescription.  Patient also states that she is taking the Lantus Solostar 100unit/ml.  She is giving between 24 and 28 units.  Patient does not have any complaints regarding medication   Hypertension- Pt denies chest pain, SOB, dizziness, or heart palpitations.  Taking meds as directed w/o problems.  Denies medication side effects.  Brought home BP cuff.   Hypothyroidism-we also recently adjusted her thyroid medication TSH was just slightly elevated.  Encouraged her to increase her dose by half a tab 1 day a week.  Also like me to check her right ear today she has had some intermittent pain she just wants to make sure that the tube is in place and it does not look infected.   Lab Results  Component Value Date   HGBA1C 8.0 (H) 08/17/2022      ROS    Objective:     BP (!) 138/41   Pulse 82   Temp 97.9 F (36.6 C) (Oral)   Resp 20   Ht 5' (1.524 m)   Wt 149 lb 12.8 oz (67.9 kg)   SpO2 100%   BMI 29.26 kg/m    Physical Exam Vitals and nursing note reviewed.   Constitutional:      Appearance: She is well-developed.  HENT:     Head: Normocephalic and atraumatic.  Cardiovascular:     Rate and Rhythm: Normal rate and regular rhythm.     Heart sounds: Normal heart sounds.  Pulmonary:     Effort: Pulmonary effort is normal.     Breath sounds: Normal breath sounds.  Skin:    General: Skin is warm and dry.  Neurological:     Mental Status: She is alert and oriented to person, place, and time.  Psychiatric:        Behavior: Behavior normal.      No results found for any visits on 10/21/22.    The ASCVD Risk score (Arnett DK, et al., 2019) failed to calculate for the following reasons:   The valid total cholesterol range is 130 to 320 mg/dL    Assessment & Plan:   Problem List Items Addressed This Visit       Cardiovascular and Mediastinum   Type 2 diabetes mellitus with vascular disease - Primary    A1c looks much better today at 7.3.  Still little ways to go.  Unfortunately we did have to backtrack on the [facility] because she was unable to get the  10 mg dose after she had had been on it for about a month.  Hopefully when she gets on her supply will be able to go back up to 10 mg had really like to get her off of the insulin.  Follow-up in 3 months.  Continue to work on [employer] and regular exercise.      Relevant Medications   amLODipine (NORVASC) 5 MG tablet   losartan (COZAAR) 100 MG tablet   Essential hypertension    Blood pressure does look better today.  Still little borderline elevated but we will continue to monitor it.  She is very worried about her diastolic number going to low.  She says sometimes at home it even drops less than 40.  So we will decrease the amlodipine to 5 mg.      Relevant Medications   amLODipine (NORVASC) 5 MG tablet   losartan (COZAAR) 100 MG tablet     Endocrine   Hypothyroidism    Due to recheck TSH.  We had adjusted her dose in January and she is due to recheck that level.        Return in about 3 months (around 01/20/2023) for Diabetes follow-up, Hypertension.    Yolanda Lecher, MD

## 2022-10-21 NOTE — Assessment & Plan Note (Signed)
Due to recheck TSH.  We had adjusted her dose in January and she is due to recheck that level.

## 2022-10-21 NOTE — Assessment & Plan Note (Signed)
Blood pressure does look better today.  Still little borderline elevated but we will continue to monitor it.  She is very worried about her diastolic number going to low.  She says sometimes at home it even drops less than 40.  So we will decrease the amlodipine to 5 mg.

## 2022-10-21 NOTE — Assessment & Plan Note (Signed)
A1c looks much better today at 7.3.  Still little ways to go.  Unfortunately we did have to backtrack on the [facility] because she was unable to get the 10 mg dose after she had had been on it for about a month.  Hopefully when she gets on her supply will be able to go back up to 10 mg had really like to get her off of the insulin.  Follow-up in 3 months.  Continue to work on [employer] and regular exercise.

## 2022-10-22 ENCOUNTER — Other Ambulatory Visit: Payer: Self-pay | Admitting: *Deleted

## 2022-10-22 DIAGNOSIS — E038 Other specified hypothyroidism: Secondary | ICD-10-CM

## 2022-10-22 LAB — TSH: TSH: 3.15 mIU/L (ref 0.40–4.50)

## 2022-10-22 MED ORDER — LEVOTHYROXINE SODIUM 125 MCG PO TABS
125.0000 ug | ORAL_TABLET | Freq: Every day | ORAL | 1 refills | Status: DC
Start: 2022-10-22 — End: 2023-04-12

## 2022-10-22 NOTE — Progress Notes (Signed)
Call patient: Thyroid looks better this time its at 3.1 so we will continue with current regimen and then plan to recheck again in about 4 months.

## 2022-10-26 ENCOUNTER — Telehealth: Payer: Self-pay

## 2022-10-26 NOTE — Patient Outreach (Signed)
  Care Coordination   10/26/2022 Name: Mareena Zuleger MRN: [id number] DOB: 05/28/1946   Care Coordination Outreach Attempts:  An unsuccessful telephone outreach was attempted for a scheduled appointment today.  Follow Up Plan:  Additional outreach attempts will be made to offer the patient care coordination information and services.   Encounter Outcome:  No Answer   Care Coordination Interventions:  No, not indicated    Kathyrn Sheriff, RN, MSN, BSN, CCM [facility] Care Coordinator [phone]

## 2022-10-28 ENCOUNTER — Telehealth: Payer: Self-pay | Admitting: *Deleted

## 2022-10-28 NOTE — Progress Notes (Signed)
  Care Coordination Note  10/28/2022 Name: Yolanda Fields MRN: [id number] DOB: 1945-11-27  Yolanda Fields is a 77 y.o. year old female who is a primary care patient of [employer], MD and is actively engaged with the care management team. I reached out to [name] by phone today to assist with re-scheduling a follow up visit with the RN Case Manager  Follow up plan: Unsuccessful telephone outreach attempt made. A HIPAA compliant phone message was left for the patient providing contact information and requesting a return call.   Burman Nieves, CCMA Care Coordination Care Guide Direct Dial: [phone]

## 2022-11-10 DIAGNOSIS — H401132 Primary open-angle glaucoma, bilateral, moderate stage: Secondary | ICD-10-CM | POA: Diagnosis not present

## 2022-11-10 NOTE — Progress Notes (Signed)
  Care Coordination Note  11/10/2022 Name: Yolanda Fields MRN: [id number] DOB: [birth year]  Yolanda Fields is a 77 y.o. year old female who is a primary care patient of Agapito Games, MD and is actively engaged with the care management team. I reached out to [name] by phone today to assist with re-scheduling a follow up visit with the RN Case Manager  Follow up plan: Telephone appointment with care management team member scheduled for: 11/25/2022  Burman Nieves, [facility] Care Coordination Care Guide Direct Dial: [phone]

## 2022-11-19 DIAGNOSIS — Z1231 Encounter for screening mammogram for malignant neoplasm of breast: Secondary | ICD-10-CM | POA: Diagnosis not present

## 2022-11-19 DIAGNOSIS — R92323 Mammographic fibroglandular density, bilateral breasts: Secondary | ICD-10-CM | POA: Diagnosis not present

## 2022-11-19 LAB — HM MAMMOGRAPHY

## 2022-11-25 ENCOUNTER — Ambulatory Visit: Payer: Self-pay

## 2022-11-25 NOTE — Patient Outreach (Signed)
  Care Coordination   Initial Visit Note   11/25/2022 Name: Yolanda Fields MRN: [id number] DOB: Dec 11, 1945  Yolanda Fields is a 77 y.o. year old female who sees Metheney, Barbarann Ehlers, MD for primary care. I spoke with  Yolanda Fields by phone today.  What matters to the patients health and wellness today?  Diabetes education . Yolanda Fields states she checks blood pressure consistently and checks blood sugars as recommended. She reports this morning her blood sugar was 140, but states she did not take the full dose of her Lantus last night because her blood sugar was 139 last night and she was concerned it would be too low. But she states this is not her usual.  Goals Addressed             This Visit's Progress    Care coordination: Diabetes education       Interventions Today    Flowsheet Row Most Recent Value  Chronic Disease   Chronic disease during today's visit Diabetes, Hypertension (HTN)  General Interventions   General Interventions Discussed/Reviewed General Interventions Discussed, Doctor Visits  Doctor Visits Discussed/Reviewed Doctor Visits Discussed, PCP  PCP/Specialist Visits Compliance with follow-up visit  Education Interventions   Education Provided Provided Web-based Education  [diabetes and nutrition, encouraged to continue to check blood sugars and notify provider if questions or concerns]  Pharmacy Interventions   Pharmacy Dicussed/Reviewed Pharmacy Topics Discussed, Medications and their functions, Medication Adherence, Affording Medications              SDOH assessments and interventions completed:  Yes  SDOH Interventions Today    Flowsheet Row Most Recent Value  SDOH Interventions   Food Insecurity Interventions Intervention Not Indicated  Housing Interventions Intervention Not Indicated  Transportation Interventions Intervention Not Indicated  Utilities Interventions Intervention Not Indicated     Care Coordination Interventions:  Yes, provided   Follow  up plan: Follow up call scheduled for 12/16/22    Encounter Outcome:  Pt. Visit Completed   Kathyrn Sheriff, RN, MSN, BSN, CCM [facility] Care Coordinator [phone]

## 2022-11-25 NOTE — Patient Instructions (Signed)
Visit Information  Thank you for taking time to visit with me today. Please don't hesitate to contact me if I can be of assistance to you.   Following are the goals we discussed today:   Goals Addressed             This Visit's Progress    Care coordination: Diabetes education         Interventions Today    Flowsheet Row Most Recent Value  Chronic Disease   Chronic disease during today's visit Diabetes, Hypertension (HTN)  General Interventions   General Interventions Discussed/Reviewed General Interventions Discussed, Doctor Visits  Doctor Visits Discussed/Reviewed Doctor Visits Discussed  Pharmacy Interventions   Pharmacy Dicussed/Reviewed Pharmacy Topics Discussed, Medications and their functions, Medication Adherence, Affording Medications            Our next appointment is by telephone on 12/16/22 at 9:45 am  Please call the care guide team at [phone] if you need to cancel or reschedule your appointment.   If you are experiencing a Mental Health or Behavioral Health Crisis or need someone to talk to, please call the Suicide and Crisis Lifeline: 40  Kathyrn Sheriff, RN, MSN, BSN, CCM [facility] Care Coordinator [phone]    Diabetes Mellitus and Nutrition, Adult When you have diabetes, or diabetes mellitus, it is very important to have healthy eating habits because your blood sugar (glucose) levels are greatly affected by what you eat and drink. Eating healthy foods in the right amounts, at about the same times every day, can help you: Manage your blood glucose. Lower your risk of heart disease. Improve your blood pressure. Reach or maintain a healthy weight. What can affect my meal plan? Every person with diabetes is different, and each person has different needs for a meal plan. Your health care provider may recommend that you work with a dietitian to make a meal plan that is best for you. Your meal plan may vary depending on factors such as: The calories you  need. The medicines you take. Your weight. Your blood glucose, blood pressure, and cholesterol levels. Your activity level. Other health conditions you have, such as heart or kidney disease. How do carbohydrates affect me? Carbohydrates, also called carbs, affect your blood glucose level more than any other type of food. Eating carbs raises the amount of glucose in your blood. It is important to know how many carbs you can safely have in each meal. This is different for every person. Your dietitian can help you calculate how many carbs you should have at each meal and for each snack. How does alcohol affect me? Alcohol can cause a decrease in blood glucose (hypoglycemia), especially if you use insulin or take certain diabetes medicines by mouth. Hypoglycemia can be a life-threatening condition. Symptoms of hypoglycemia, such as sleepiness, dizziness, and confusion, are similar to symptoms of having too much alcohol. Do not drink alcohol if: Your health care provider tells you not to drink. You are pregnant, may be pregnant, or are planning to become pregnant. If you drink alcohol: Limit how much you have to: 0-1 drink a day for women. 0-2 drinks a day for men. Know how much alcohol is in your drink. In the [place] one drink equals one 12 oz bottle of beer (355 mL), one 5 oz glass of wine (148 mL), or one 1 oz glass of hard liquor (44 mL). Keep yourself hydrated with water, diet soda, or unsweetened iced tea. Keep in mind that regular soda, juice, and  other mixers may contain a lot of sugar and must be counted as carbs. What are tips for following this plan?  Reading food labels Start by checking the serving size on the Nutrition Facts label of packaged foods and drinks. The number of calories and the amount of carbs, fats, and other nutrients listed on the label are based on one serving of the item. Many items contain more than one serving per package. Check the total grams (g) of carbs in one  serving. Check the number of grams of saturated fats and trans fats in one serving. Choose foods that have a low amount or none of these fats. Check the number of milligrams (mg) of salt (sodium) in one serving. Most people should limit total sodium intake to less than 2,300 mg per day. Always check the nutrition information of foods labeled as "low-fat" or "nonfat." These foods may be higher in added sugar or refined carbs and should be avoided. Talk to your dietitian to identify your daily goals for nutrients listed on the label. Shopping Avoid buying canned, pre-made, or processed foods. These foods tend to be high in fat, sodium, and added sugar. Shop around the outside edge of the grocery store. This is where you will most often find fresh fruits and vegetables, bulk grains, fresh meats, and fresh dairy products. Cooking Use low-heat cooking methods, such as baking, instead of high-heat cooking methods, such as deep frying. Cook using healthy oils, such as olive, canola, or sunflower oil. Avoid cooking with butter, cream, or high-fat meats. Meal planning Eat meals and snacks regularly, preferably at the same times every day. Avoid going long periods of time without eating. Eat foods that are high in fiber, such as fresh fruits, vegetables, beans, and whole grains. Eat 4-6 oz (112-168 g) of lean protein each day, such as lean meat, chicken, fish, eggs, or tofu. One ounce (oz) (28 g) of lean protein is equal to: 1 oz (28 g) of meat, chicken, or fish. 1 egg.  cup (62 g) of tofu. Eat some foods each day that contain healthy fats, such as avocado, nuts, seeds, and fish. What foods should I eat? Fruits Berries. Apples. Oranges. Peaches. Apricots. Plums. Grapes. Mangoes. Papayas. Pomegranates. Kiwi. Cherries. Vegetables Leafy greens, including lettuce, spinach, kale, chard, collard greens, mustard greens, and cabbage. Beets. Cauliflower. Broccoli. Carrots. Green beans. Tomatoes. Peppers.  Onions. Cucumbers. Brussels sprouts. Grains Whole grains, such as whole-wheat or whole-grain bread, crackers, tortillas, cereal, and pasta. Unsweetened oatmeal. Quinoa. Brown or wild rice. Meats and other proteins Seafood. Poultry without skin. Lean cuts of poultry and beef. Tofu. Nuts. Seeds. Dairy Low-fat or fat-free dairy products such as milk, yogurt, and cheese. The items listed above may not be a complete list of foods and beverages you can eat and drink. Contact a dietitian for more information. What foods should I avoid? Fruits Fruits canned with syrup. Vegetables Canned vegetables. Frozen vegetables with butter or cream sauce. Grains Refined white flour and flour products such as bread, pasta, snack foods, and cereals. Avoid all processed foods. Meats and other proteins Fatty cuts of meat. Poultry with skin. Breaded or fried meats. Processed meat. Avoid saturated fats. Dairy Full-fat yogurt, cheese, or milk. Beverages Sweetened drinks, such as soda or iced tea. The items listed above may not be a complete list of foods and beverages you should avoid. Contact a dietitian for more information. Questions to ask a health care provider Do I need to meet with a certified diabetes care  and education specialist? Do I need to meet with a dietitian? What number can I call if I have questions? When are the best times to check my blood glucose? Where to find more information: American Diabetes Association: diabetes.org Academy of Nutrition and Dietetics: eatright[employer] of Diabetes and Digestive and Kidney Diseases: StageSync.si Association of Diabetes Care & Education Specialists: diabeteseducator.org Summary It is important to have healthy eating habits because your blood sugar (glucose) levels are greatly affected by what you eat and drink. It is important to use alcohol carefully. A healthy meal plan will help you manage your blood glucose and lower your risk of  heart disease. Your health care provider may recommend that you work with a dietitian to make a meal plan that is best for you. This information is not intended to replace advice given to you by your health care provider. Make sure you discuss any questions you have with your health care provider. Document Revised: 02/07/2020 Document Reviewed: 02/07/2020 Elsevier Patient Education  2023 [employer].

## 2022-11-27 ENCOUNTER — Encounter: Payer: Self-pay | Admitting: Family Medicine

## 2022-12-08 DIAGNOSIS — H7201 Central perforation of tympanic membrane, right ear: Secondary | ICD-10-CM | POA: Diagnosis not present

## 2022-12-08 DIAGNOSIS — J0140 Acute pansinusitis, unspecified: Secondary | ICD-10-CM | POA: Diagnosis not present

## 2022-12-16 ENCOUNTER — Ambulatory Visit: Payer: Self-pay

## 2022-12-16 NOTE — Patient Instructions (Signed)
Visit Information  Thank you for taking time to visit with me today. Please don't hesitate to contact me if I can be of assistance to you.   Following are the goals we discussed today:  Continue to take medications as prescribed Continue to attend provider visits as scheduled Continue to check and record blood sugar. Notify provider with any readings outside recommended parameters Continue to eat healthy Continue to exercise/walk as tolerated   Our next appointment is by telephone on 7/10//24 at 10:00 am  Please call the care guide team at [phone] if you need to cancel or reschedule your appointment.   If you are experiencing a Mental Health or Behavioral Health Crisis or need someone to talk to, please call the Suicide and Crisis Lifeline: 32   Kathyrn Sheriff, RN, MSN, BSN, CCM [facility] Care Coordinator [phone]

## 2022-12-16 NOTE — Patient Outreach (Signed)
  Care Coordination   Follow Up Visit Note   12/16/2022 Name: Yolanda Fields MRN: [id number] DOB: October 18, 1945  Yolanda Fields is a 77 y.o. year old female who sees Metheney, Barbarann Ehlers, MD for primary care. I spoke with  [name] by phone today.  What matters to the patients health and wellness today?  Reports blood sugars 92-100's. Denies hypoglycemia. Last A1C 7.3. states has had an annual eye exam, check blood pressure routinely 123/66-130/67 and walks daily(in the house if bad weather).   Goals Addressed             This Visit's Progress    Care coordination: Diabetes education       Interventions Today    Flowsheet Row Most Recent Value  Chronic Disease   Chronic disease during today's visit Diabetes, Hypertension (HTN)  General Interventions   General Interventions Discussed/Reviewed General Interventions Reviewed, Doctor Visits  Doctor Visits Discussed/Reviewed Doctor Visits Discussed  PCP/Specialist Visits Compliance with follow-up visit  Education Interventions   Education Provided Provided Web-based Education  Provided Verbal Education On Exercise, Medication, Nutrition, Foot Care, Eye Care, Labs  [Emmi article: diabetes and diet,  abcs of diabetes,  footcare for people with idabetes and Hgb A1C]  Labs Reviewed Hgb A1c  Nutrition Interventions   Nutrition Discussed/Reviewed Nutrition Reviewed  Pharmacy Interventions   Pharmacy Dicussed/Reviewed Pharmacy Topics Reviewed           SDOH assessments and interventions completed:  No  Care Coordination Interventions:  Yes, provided   Follow up plan: Follow up call scheduled for 01/27/23    Encounter Outcome:  Pt. Visit Completed   Kathyrn Sheriff, RN, MSN, BSN, CCM [facility] Care Coordinator [phone]

## 2022-12-28 ENCOUNTER — Other Ambulatory Visit: Payer: Self-pay | Admitting: Family Medicine

## 2022-12-28 DIAGNOSIS — E1159 Type 2 diabetes mellitus with other circulatory complications: Secondary | ICD-10-CM

## 2023-01-25 ENCOUNTER — Ambulatory Visit (INDEPENDENT_AMBULATORY_CARE_PROVIDER_SITE_OTHER): Payer: 59 | Admitting: Family Medicine

## 2023-01-25 ENCOUNTER — Encounter: Payer: Self-pay | Admitting: Family Medicine

## 2023-01-25 VITALS — BP 128/64 | HR 95 | Ht 60.0 in | Wt 147.0 lb

## 2023-01-25 DIAGNOSIS — Z7984 Long term (current) use of oral hypoglycemic drugs: Secondary | ICD-10-CM

## 2023-01-25 DIAGNOSIS — E038 Other specified hypothyroidism: Secondary | ICD-10-CM

## 2023-01-25 DIAGNOSIS — M4722 Other spondylosis with radiculopathy, cervical region: Secondary | ICD-10-CM

## 2023-01-25 DIAGNOSIS — I10 Essential (primary) hypertension: Secondary | ICD-10-CM | POA: Diagnosis not present

## 2023-01-25 DIAGNOSIS — E1159 Type 2 diabetes mellitus with other circulatory complications: Secondary | ICD-10-CM

## 2023-01-25 DIAGNOSIS — M47812 Spondylosis without myelopathy or radiculopathy, cervical region: Secondary | ICD-10-CM | POA: Insufficient documentation

## 2023-01-25 LAB — POCT GLYCOSYLATED HEMOGLOBIN (HGB A1C): Hemoglobin A1C: 6.6 % — AB (ref 4.0–5.6)

## 2023-01-25 NOTE — Assessment & Plan Note (Signed)
Diastolic pressures look much better on decreased dose of amlodipine and her systolic also looks great as well.  She is also down 3 pounds which I think is helping as well.  Continue current regimen and will continue to monitor.

## 2023-01-25 NOTE — Progress Notes (Signed)
Established Patient Office Visit  Subjective   Patient ID: Yolanda Fields, female    DOB: 10-09-1945  Age: 77 y.o. MRN: [id number]  Chief Complaint  Patient presents with   Diabetes    HPI  Diabetes - no hypoglycemic events. No wounds or sores that are not healing well. No increased thirst or urination. Checking glucose at home. Taking medications as prescribed without any side effects.  She is down another 3 pounds from when I last saw her in April.  Still on 7.5 mg of Mounjaro.  She is taking around 26 units on her Lantus.  She says sometimes she skips it if her sugar is a little low.  She is also about 3 pounds and she was last here she said she read that Lantus can actually cause weight gain and wanted to discuss that today.  Follow-up hypertension-blood pressures have been borderline.  But she felt like her diastolics were going really low at home so we had actually decreased her amlodipine to 5 mg back in April when I saw her.    ROS    Objective:     BP 128/64 (BP Location: Left Arm, Cuff Size: Normal)   Pulse 95   Ht 5' (1.524 m)   Wt 147 lb (66.7 kg)   SpO2 98%   BMI 28.71 kg/m    Physical Exam Vitals and nursing note reviewed.  Constitutional:      Appearance: She is well-developed.  HENT:     Head: Normocephalic and atraumatic.  Cardiovascular:     Rate and Rhythm: Normal rate and regular rhythm.     Heart sounds: Normal heart sounds.  Pulmonary:     Effort: Pulmonary effort is normal.     Breath sounds: Normal breath sounds.  Musculoskeletal:     Comments: Dec ROM of cervical spine  Skin:    General: Skin is warm and dry.  Neurological:     Mental Status: She is alert and oriented to person, place, and time.  Psychiatric:        Behavior: Behavior normal.      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  Continue with Mounjaro we did discuss that if she is able to get the 10 mg dose and move up and tolerate it well we may be able to get her completely off of her insulin.  In the short-term I did encourage her to decrease her insulin from 26 units down to 22 units.  As she has had some lows where she has had to hold her medication.  Continue to work on healthy diet and regular exercise.      Relevant Orders   POCT glycosylated hemoglobin (Hb A1C) (Completed)  Urine Microalbumin w/creat. ratio   TSH   COMPLETE METABOLIC PANEL WITH GFR   Essential hypertension    Diastolic pressures look much better on decreased dose of amlodipine and her systolic also looks great as well.  She is also down 3 pounds which I think is helping as well.  Continue current regimen and will continue to monitor.      Relevant Orders   Urine Microalbumin w/creat. ratio   TSH   COMPLETE METABOLIC PANEL WITH GFR     Endocrine   Hypothyroidism   Relevant Orders   Urine Microalbumin w/creat. ratio   TSH   COMPLETE METABOLIC PANEL WITH GFR     Musculoskeletal and Integument   Cervical osteoarthritis    She also wanted to give me an update.  About 21 years go she had an injury to her head neck and left arm.  At the time they had recommended surgery but she had opted not to because her mother was passing away at that time.  She occasionally gets flares in her neck with pain and it started flaring up yesterday after hearing that her older  sister is going to be moving into assisted living.  We discussed that she can continue with conservative measures for her neck over the next week but if it is not improving on its own and going back to baseline then recommend formal physical therapy.  She has done well with this in the past.  She will just call and let me know.       Return in about 3 months (around 04/27/2023) for Diabetes follow-up.    Yolanda Gasser, MD

## 2023-01-25 NOTE — Assessment & Plan Note (Signed)
A1c looks phenomenal this time at 6.6, down from 8.0.  Continue with Mounjaro we did discuss that if she is able to get the 10 mg dose and move up and tolerate it well we may be able to get her completely off of her insulin.  In the short-term I did encourage her to decrease her insulin from 26 units down to 22 units.  As she has had some lows where she has had to hold her medication.  Continue to work on healthy diet and regular exercise.

## 2023-01-25 NOTE — Assessment & Plan Note (Signed)
She also wanted to give me an update.  About 21 years go she had an injury to her head neck and left arm.  At the time they had recommended surgery but she had opted not to because her mother was passing away at that time.  She occasionally gets flares in her neck with pain and it started flaring up yesterday after hearing that her older sister is going to be moving into assisted living.  We discussed that she can continue with conservative measures for her neck over the next week but if it is not improving on its own and going back to baseline then recommend formal physical therapy.  She has done well with this in the past.  She will just call and let me know.

## 2023-01-26 ENCOUNTER — Telehealth: Payer: Self-pay | Admitting: Family Medicine

## 2023-01-26 LAB — COMPLETE METABOLIC PANEL WITH GFR
AG Ratio: 1.9 (calc) (ref 1.0–2.5)
ALT: 13 U/L (ref 6–29)
AST: 16 U/L (ref 10–35)
Albumin: 4.1 g/dL (ref 3.6–5.1)
Alkaline phosphatase (APISO): 73 U/L (ref 37–153)
BUN/Creatinine Ratio: 23 (calc) — ABNORMAL HIGH (ref 6–22)
BUN: 25 mg/dL (ref 7–25)
CO2: 25 mmol/L (ref 20–32)
Calcium: 9.3 mg/dL (ref 8.6–10.4)
Chloride: 106 mmol/L (ref 98–110)
Creat: 1.11 mg/dL — ABNORMAL HIGH (ref 0.60–1.00)
Globulin: 2.2 g/dL (calc) (ref 1.9–3.7)
Glucose, Bld: 104 mg/dL — ABNORMAL HIGH (ref 65–99)
Potassium: 4.4 mmol/L (ref 3.5–5.3)
Sodium: 141 mmol/L (ref 135–146)
Total Bilirubin: 1.4 mg/dL — ABNORMAL HIGH (ref 0.2–1.2)
Total Protein: 6.3 g/dL (ref 6.1–8.1)
eGFR: 51 mL/min/{1.73_m2} — ABNORMAL LOW (ref >=60)

## 2023-01-26 LAB — MICROALBUMIN / CREATININE URINE RATIO
Creatinine, Urine: 55 mg/dL (ref 20–275)
Microalb Creat Ratio: 33 mg/g creat — ABNORMAL HIGH (ref <30)
Microalb, Ur: 1.8 mg/dL

## 2023-01-26 LAB — TSH: TSH: 1.27 mIU/L (ref 0.40–4.50)

## 2023-01-26 NOTE — Telephone Encounter (Signed)
Pt  returned called.  She is available now. Telephone contact [phone]

## 2023-01-26 NOTE — Progress Notes (Signed)
HI Bonita Quin, kidney function is stable. Your thyroid is normal. There is still some protein in the urine so we will keep an eye on this. Continue losartan.

## 2023-01-27 ENCOUNTER — Ambulatory Visit: Payer: Self-pay

## 2023-01-27 NOTE — Patient Outreach (Signed)
  Care Coordination   Follow Up Visit Note   01/27/2023 Name: Yolanda Fields MRN: [id number] DOB: [birth year]  Yolanda Fields is a 77 y.o. year old female who sees Metheney, Barbarann Ehlers, MD for primary care. I spoke with  Yolanda Fields by phone today.  What matters to the patients health and wellness today?  Yolanda Fields reports she is doing well. Visit with PCP completed on 01/25/23. She reports A1C is 6.6. she states she did not receive diabetes related Emmi education articles sent previously.  Goals Addressed             This Visit's Progress    Care coordination: Diabetes education       Interventions Today    Flowsheet Row Most Recent Value  Chronic Disease   Chronic disease during today's visit Diabetes  General Interventions   General Interventions Discussed/Reviewed General Interventions Reviewed, Doctor Visits  Doctor Visits Discussed/Reviewed Doctor Visits Reviewed, PCP  PCP/Specialist Visits Compliance with follow-up visit  [reviewed upcoming/scheduled appointments]  Education Interventions   Education Provided Provided Education  [facility] will resend articles re: diabetes]  Provided Verbal Education On Nutrition, Blood Sugar Monitoring, Exercise, When to see the doctor, Medication, Other  [advised to continue to check blood sugar as recommended, take medications as prescribed, eat healthy monitor carbohydrates and avoid saturated/transfats. encouraged to continue exercise regiman-walking]  Nutrition Interventions   Nutrition Discussed/Reviewed Nutrition Reviewed  Pharmacy Interventions   Pharmacy Dicussed/Reviewed Pharmacy Topics Reviewed  [assessed medications reveiwed with PCP on 01/25/23. patient reports no changes.]           SDOH assessments and interventions completed:  Yes  SDOH Interventions Today    Flowsheet Row Most Recent Value  SDOH Interventions   Food Insecurity Interventions Intervention Not Indicated  Housing Interventions Intervention Not Indicated   Transportation Interventions Intervention Not Indicated  Utilities Interventions Intervention Not Indicated     Care Coordination Interventions:  Yes, provided   Follow up plan: Follow up call scheduled for 02/26/23    Encounter Outcome:  Pt. Visit Completed   Kathyrn Sheriff, RN, MSN, BSN, CCM [facility] Care Coordinator [phone]

## 2023-01-27 NOTE — Patient Instructions (Addendum)
Visit Information  Thank you for taking time to visit with me today. Please don't hesitate to contact me if I can be of assistance to you.   Following are the goals we discussed today:  Continue to take medications as prescribed Continue to attend provider visits as scheduled Contact your provider with health questions or concerns as needed Please review article sent via mail contact RN Care Coordinator if you have any questions Continue to check blood sugars as recommended by your provider and notify provider if outside recommended range   Our next appointment is by telephone on 02/26/23 at 10:30 am  Please call the care guide team at [phone] if you need to cancel or reschedule your appointment.   If you are experiencing a Mental Health or Behavioral Health Crisis or need someone to talk to, please call the Suicide and Crisis Lifeline: 16  Kathyrn Sheriff, RN, MSN, BSN, CCM [facility] Care Coordinator [phone]

## 2023-01-28 ENCOUNTER — Other Ambulatory Visit: Payer: Self-pay | Admitting: Family Medicine

## 2023-01-28 DIAGNOSIS — E1129 Type 2 diabetes mellitus with other diabetic kidney complication: Secondary | ICD-10-CM

## 2023-01-28 NOTE — Telephone Encounter (Signed)
Patient returned your call.

## 2023-01-28 NOTE — Telephone Encounter (Signed)
Spoke with patient and mailed lab results to patient at her request.

## 2023-02-16 DIAGNOSIS — E119 Type 2 diabetes mellitus without complications: Secondary | ICD-10-CM | POA: Diagnosis not present

## 2023-02-16 DIAGNOSIS — Z794 Long term (current) use of insulin: Secondary | ICD-10-CM | POA: Diagnosis not present

## 2023-02-18 DIAGNOSIS — H73002 Acute myringitis, left ear: Secondary | ICD-10-CM | POA: Diagnosis not present

## 2023-02-18 DIAGNOSIS — H7201 Central perforation of tympanic membrane, right ear: Secondary | ICD-10-CM | POA: Diagnosis not present

## 2023-02-26 ENCOUNTER — Telehealth: Payer: Self-pay

## 2023-02-26 NOTE — Patient Outreach (Signed)
  Care Coordination   02/26/2023 Name: Yolanda Fields MRN: [id number] DOB: [birth year]   Care Coordination Outreach Attempts:  An unsuccessful telephone outreach was attempted for a scheduled appointment today.  Follow Up Plan:  Additional outreach attempts will be made to offer the patient care coordination information and services.   Encounter Outcome:  No Answer   Care Coordination Interventions:  No, not indicated    Kathyrn Sheriff, RN, MSN, BSN, CCM [facility] Care Coordinator [phone]

## 2023-03-17 ENCOUNTER — Telehealth: Payer: Self-pay | Admitting: *Deleted

## 2023-03-17 NOTE — Progress Notes (Signed)
  Care Coordination Note  03/17/2023 Name: Toni Schnipke MRN: [id number] DOB: [birth year]  Thecla Gunkel is a 77 y.o. year old female who is a primary care patient of Agapito Games, MD and is actively engaged with the care management team. I reached out to [name] by phone today to assist with re-scheduling a follow up visit with the RN Case Manager  Follow up plan: We have been unable to make contact with the patient for follow up.   Burman Nieves, CCMA Care Coordination Care Guide Direct Dial: [phone]

## 2023-03-29 DIAGNOSIS — Z85828 Personal history of other malignant neoplasm of skin: Secondary | ICD-10-CM | POA: Diagnosis not present

## 2023-03-29 DIAGNOSIS — L820 Inflamed seborrheic keratosis: Secondary | ICD-10-CM | POA: Diagnosis not present

## 2023-03-29 DIAGNOSIS — L814 Other melanin hyperpigmentation: Secondary | ICD-10-CM | POA: Diagnosis not present

## 2023-03-29 DIAGNOSIS — D1801 Hemangioma of skin and subcutaneous tissue: Secondary | ICD-10-CM | POA: Diagnosis not present

## 2023-03-30 ENCOUNTER — Other Ambulatory Visit: Payer: Self-pay | Admitting: Family Medicine

## 2023-03-30 DIAGNOSIS — E1159 Type 2 diabetes mellitus with other circulatory complications: Secondary | ICD-10-CM

## 2023-03-30 DIAGNOSIS — Z794 Long term (current) use of insulin: Secondary | ICD-10-CM

## 2023-04-09 ENCOUNTER — Other Ambulatory Visit: Payer: Self-pay | Admitting: Family Medicine

## 2023-04-09 DIAGNOSIS — E038 Other specified hypothyroidism: Secondary | ICD-10-CM

## 2023-04-12 ENCOUNTER — Other Ambulatory Visit: Payer: Self-pay | Admitting: Family Medicine

## 2023-04-16 DIAGNOSIS — J300 Vasomotor rhinitis: Secondary | ICD-10-CM | POA: Diagnosis not present

## 2023-04-16 DIAGNOSIS — H7292 Unspecified perforation of tympanic membrane, left ear: Secondary | ICD-10-CM | POA: Diagnosis not present

## 2023-04-16 DIAGNOSIS — H73002 Acute myringitis, left ear: Secondary | ICD-10-CM | POA: Diagnosis not present

## 2023-04-19 ENCOUNTER — Other Ambulatory Visit: Payer: Self-pay | Admitting: Family Medicine

## 2023-04-26 DIAGNOSIS — M79662 Pain in left lower leg: Secondary | ICD-10-CM | POA: Diagnosis not present

## 2023-04-26 DIAGNOSIS — R079 Chest pain, unspecified: Secondary | ICD-10-CM | POA: Diagnosis not present

## 2023-04-26 DIAGNOSIS — M79661 Pain in right lower leg: Secondary | ICD-10-CM | POA: Diagnosis not present

## 2023-04-26 DIAGNOSIS — R0989 Other specified symptoms and signs involving the circulatory and respiratory systems: Secondary | ICD-10-CM | POA: Diagnosis not present

## 2023-04-27 ENCOUNTER — Encounter: Payer: Self-pay | Admitting: Family Medicine

## 2023-04-27 ENCOUNTER — Ambulatory Visit (INDEPENDENT_AMBULATORY_CARE_PROVIDER_SITE_OTHER): Payer: 59 | Admitting: Family Medicine

## 2023-04-27 VITALS — BP 134/68 | HR 105 | Ht 60.0 in | Wt 144.0 lb

## 2023-04-27 DIAGNOSIS — F4321 Adjustment disorder with depressed mood: Secondary | ICD-10-CM | POA: Diagnosis not present

## 2023-04-27 DIAGNOSIS — N1832 Chronic kidney disease, stage 3b: Secondary | ICD-10-CM

## 2023-04-27 DIAGNOSIS — Z794 Long term (current) use of insulin: Secondary | ICD-10-CM

## 2023-04-27 DIAGNOSIS — R809 Proteinuria, unspecified: Secondary | ICD-10-CM | POA: Diagnosis not present

## 2023-04-27 DIAGNOSIS — E038 Other specified hypothyroidism: Secondary | ICD-10-CM

## 2023-04-27 DIAGNOSIS — I10 Essential (primary) hypertension: Secondary | ICD-10-CM | POA: Diagnosis not present

## 2023-04-27 DIAGNOSIS — E1159 Type 2 diabetes mellitus with other circulatory complications: Secondary | ICD-10-CM | POA: Diagnosis not present

## 2023-04-27 DIAGNOSIS — E1129 Type 2 diabetes mellitus with other diabetic kidney complication: Secondary | ICD-10-CM | POA: Diagnosis not present

## 2023-04-27 DIAGNOSIS — I77819 Aortic ectasia, unspecified site: Secondary | ICD-10-CM

## 2023-04-27 LAB — POCT GLYCOSYLATED HEMOGLOBIN (HGB A1C): Hemoglobin A1C: 6.6 % — AB (ref 4.0–5.6)

## 2023-04-27 LAB — POCT UA - MICROALBUMIN
Creatinine, POC: 50 mg/dL
Microalbumin Ur, POC: 30 mg/L

## 2023-04-27 MED ORDER — MOUNJARO 12.5 MG/0.5ML ~~LOC~~ SOAJ
12.5000 mg | SUBCUTANEOUS | 1 refills | Status: DC
Start: 2023-04-27 — End: 2023-10-15

## 2023-04-27 NOTE — Assessment & Plan Note (Signed)
A1C looks great at 6.6. Plan ot increase Mounjaro to 12.5mg  and stop her insulin completely. She is occ skipping her insulin bc of lows.  Follow-up in 3 months.  Call sooner if having any problems or difficulties or still needing insulin frequently.

## 2023-04-27 NOTE — Assessment & Plan Note (Addendum)
Follow q 6 mo

## 2023-04-27 NOTE — Assessment & Plan Note (Signed)
Uncontrolled today. She just found out her sister died yesterday so she is upset today but kept her appt anyway.

## 2023-04-27 NOTE — Patient Instructions (Signed)
When you start the higher dose on the mounjaro, please stop your daily insulin.

## 2023-04-27 NOTE — Assessment & Plan Note (Signed)
Last TSH in July looked perfect.

## 2023-04-27 NOTE — Progress Notes (Signed)
Established Patient Office Visit  Subjective   Patient ID: Yolanda Fields, female    DOB: 1945-11-18  Age: 77 y.o. MRN: [id number]  Chief Complaint  Patient presents with   Diabetes    Pt's 39 yo sister passed away today.     HPI  Diabetes - no hypoglycemic events. No wounds or sores that are not healing well. No increased thirst or urination. Checking glucose at home. Taking medications as prescribed without any side effects.  She says she has at least 1 or 2 nights a week where she might actually skip her insulin because her blood sugars are under 120.  And then when she is giving her insulin she is usually giving less maybe around 15 units.  She is tolerating the Mounjaro 10 mg well without any side effects or problems.  She is down another 3 pounds since I last saw her.  Hypertension- Pt denies chest pain, SOB, dizziness, or heart palpitations.  Taking meds as directed w/o problems.  Denies medication side effects.       ROS    Objective:     BP 134/68   Pulse (!) 105   Ht 5' (1.524 m)   Wt 144 lb (65.3 kg)   SpO2 98%   BMI 28.12 kg/m    Physical Exam Vitals and nursing note reviewed.  Constitutional:      Appearance: Normal appearance.  HENT:     Head: Normocephalic and atraumatic.  Eyes:     Conjunctiva/sclera: Conjunctivae normal.  Cardiovascular:     Rate and Rhythm: Normal rate and regular rhythm.  Pulmonary:     Effort: Pulmonary effort is normal.     Breath sounds: Normal breath sounds.  Skin:    General: Skin is warm and dry.  Neurological:     Mental Status: She is alert.  Psychiatric:        Mood and Affect: Mood normal.      Results for orders placed or performed in visit on 04/27/23  POCT HgB A1C  Result Value Ref Range   Hemoglobin A1C 6.6 (A) 4.0 - 5.6 %   HbA1c POC (<> result, manual entry)     HbA1c, POC (prediabetic range)     HbA1c, POC (controlled diabetic range)    POCT UA - Microalbumin  Result Value Ref Range    Microalbumin Ur, POC 30 mg/L   Creatinine, POC 50 mg/dL   Albumin/Creatinine Ratio, Urine, POC 30-300       The ASCVD Risk score (Arnett DK, et al., 2019) failed to calculate for the following reasons:   The valid total cholesterol range is 130 to 320 mg/dL    Assessment & Plan:   Problem List Items Addressed This Visit       Cardiovascular and Mediastinum   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 She just found out her sister died yesterday so she is upset today but kept her appt anyway.       Aortic ectasia (HCC)    She had her scans updated yesterday.          Endocrine   Hypothyroidism    Last TSH in July looked perfect.      Controlled type 2 diabetes mellitus with  microalbuminuria, with long-term current use of insulin (HCC)    A1C looks great at 6.6. Plan ot increase Mounjaro to 12.5mg  and stop her insulin completely. She is occ skipping her insulin bc of lows.  Follow-up in 3 months.  Call sooner if having any problems or difficulties or still needing insulin frequently.      Relevant Medications   tirzepatide (MOUNJARO) 12.5 MG/0.5ML Pen   Other Relevant Orders   POCT UA - Microalbumin (Completed)     Genitourinary   Chronic kidney disease (CKD) stage G3b/A1, moderately decreased glomerular filtration rate (GFR) between 30-44 mL/min/1.73 square meter and albuminuria creatinine ratio less than 30 mg/g (HCC)    Follow q 6 mo       Other Visit Diagnoses     Grief          Grief - encouraged her to reach out if she is struggling. .  Return in about 3 months (around 07/28/2023).    Nani Gasser, MD

## 2023-04-27 NOTE — Assessment & Plan Note (Signed)
She had her scans updated yesterday.

## 2023-05-06 DIAGNOSIS — I10 Essential (primary) hypertension: Secondary | ICD-10-CM | POA: Diagnosis not present

## 2023-05-06 DIAGNOSIS — M79661 Pain in right lower leg: Secondary | ICD-10-CM | POA: Diagnosis not present

## 2023-05-06 DIAGNOSIS — R0989 Other specified symptoms and signs involving the circulatory and respiratory systems: Secondary | ICD-10-CM | POA: Diagnosis not present

## 2023-05-06 DIAGNOSIS — M79662 Pain in left lower leg: Secondary | ICD-10-CM | POA: Diagnosis not present

## 2023-05-11 ENCOUNTER — Other Ambulatory Visit: Payer: Self-pay | Admitting: Family Medicine

## 2023-05-12 LAB — HM DIABETES EYE EXAM

## 2023-06-08 DIAGNOSIS — Z794 Long term (current) use of insulin: Secondary | ICD-10-CM | POA: Diagnosis not present

## 2023-06-08 DIAGNOSIS — M2041 Other hammer toe(s) (acquired), right foot: Secondary | ICD-10-CM | POA: Diagnosis not present

## 2023-06-08 DIAGNOSIS — M2042 Other hammer toe(s) (acquired), left foot: Secondary | ICD-10-CM | POA: Diagnosis not present

## 2023-06-08 DIAGNOSIS — E1151 Type 2 diabetes mellitus with diabetic peripheral angiopathy without gangrene: Secondary | ICD-10-CM | POA: Diagnosis not present

## 2023-06-08 DIAGNOSIS — L602 Onychogryphosis: Secondary | ICD-10-CM | POA: Diagnosis not present

## 2023-06-08 DIAGNOSIS — L600 Ingrowing nail: Secondary | ICD-10-CM | POA: Diagnosis not present

## 2023-06-17 IMAGING — DX DG FOOT COMPLETE 3+V*L*
3 series · 3 of 3 positions shown · non-contrast
Comparison: None.

CLINICAL DATA: Heart area base of right toe.  No known injury.

EXAM:
LEFT FOOT - COMPLETE 3+ VIEW

[foot ap]
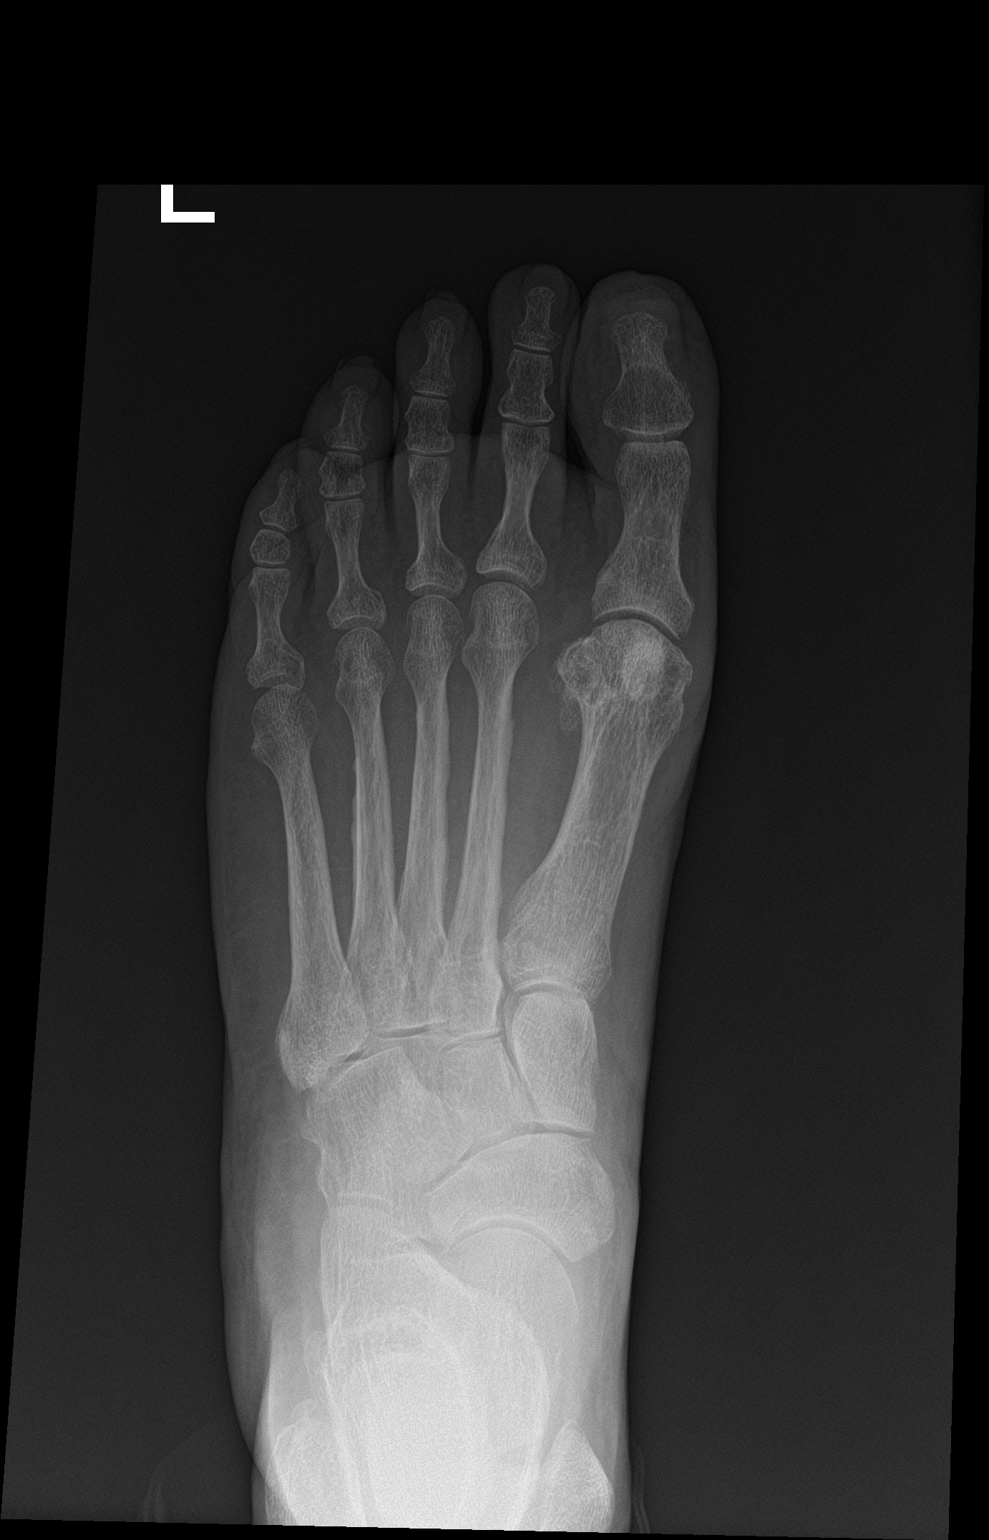

[foot obl]
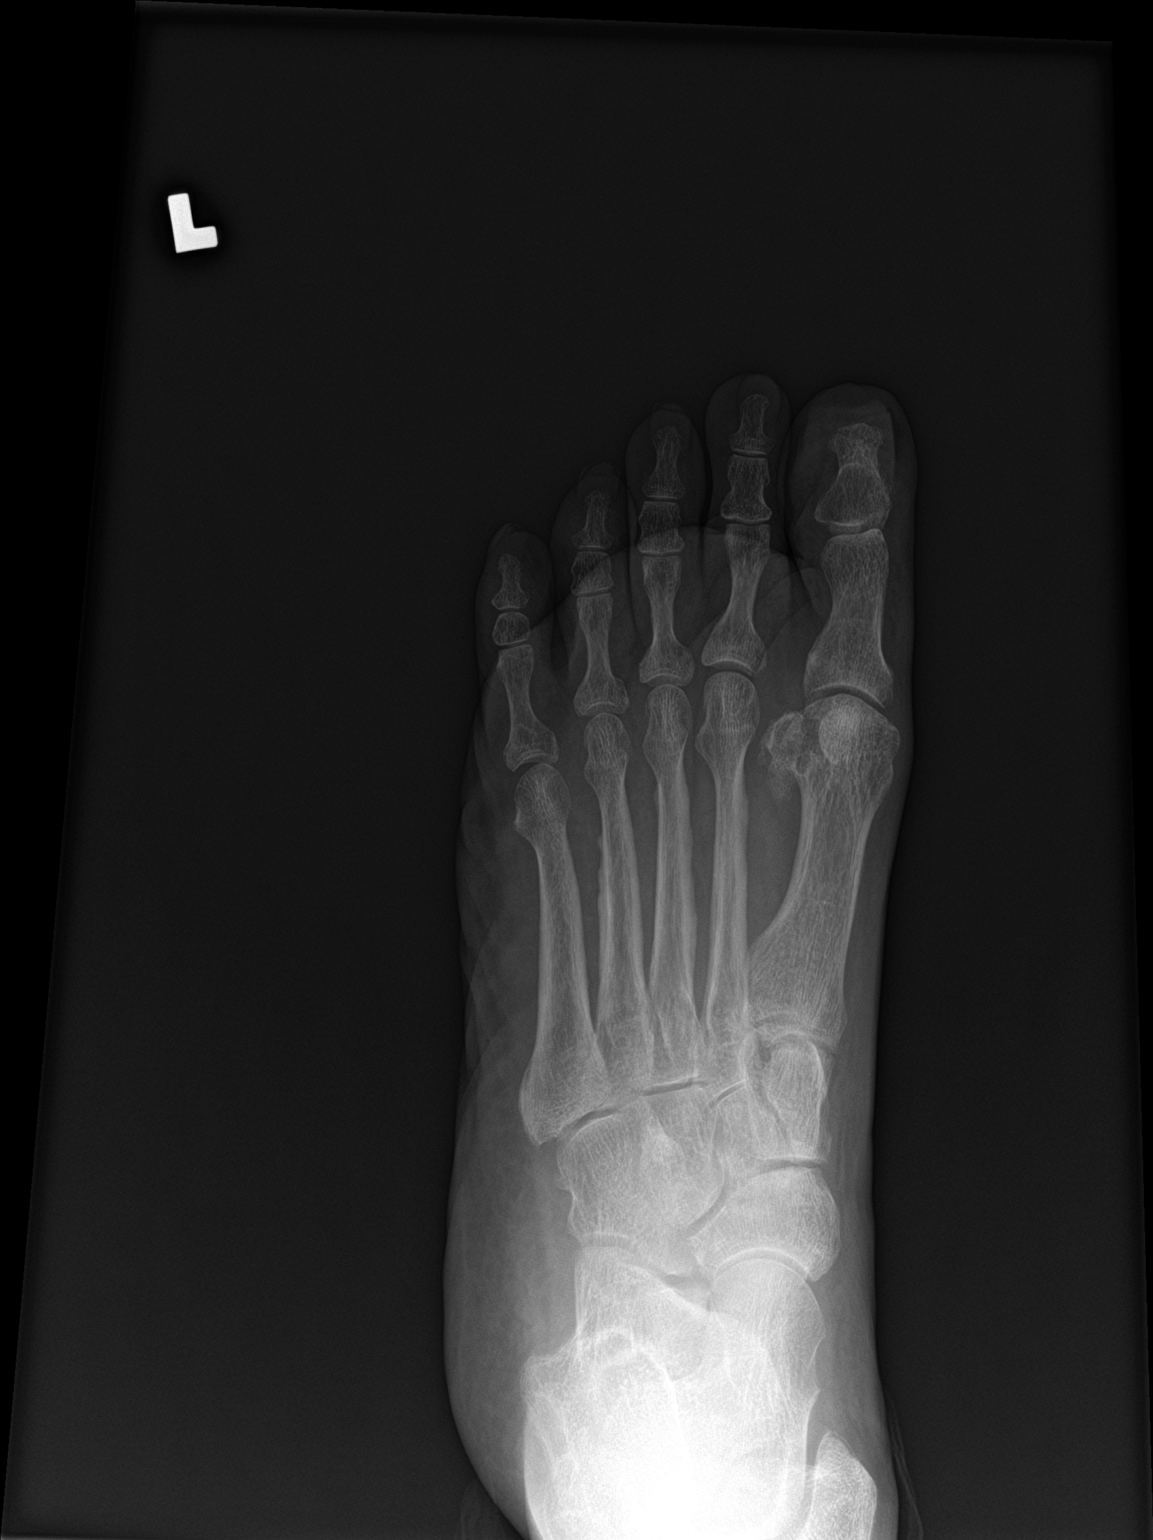

[foot lat]
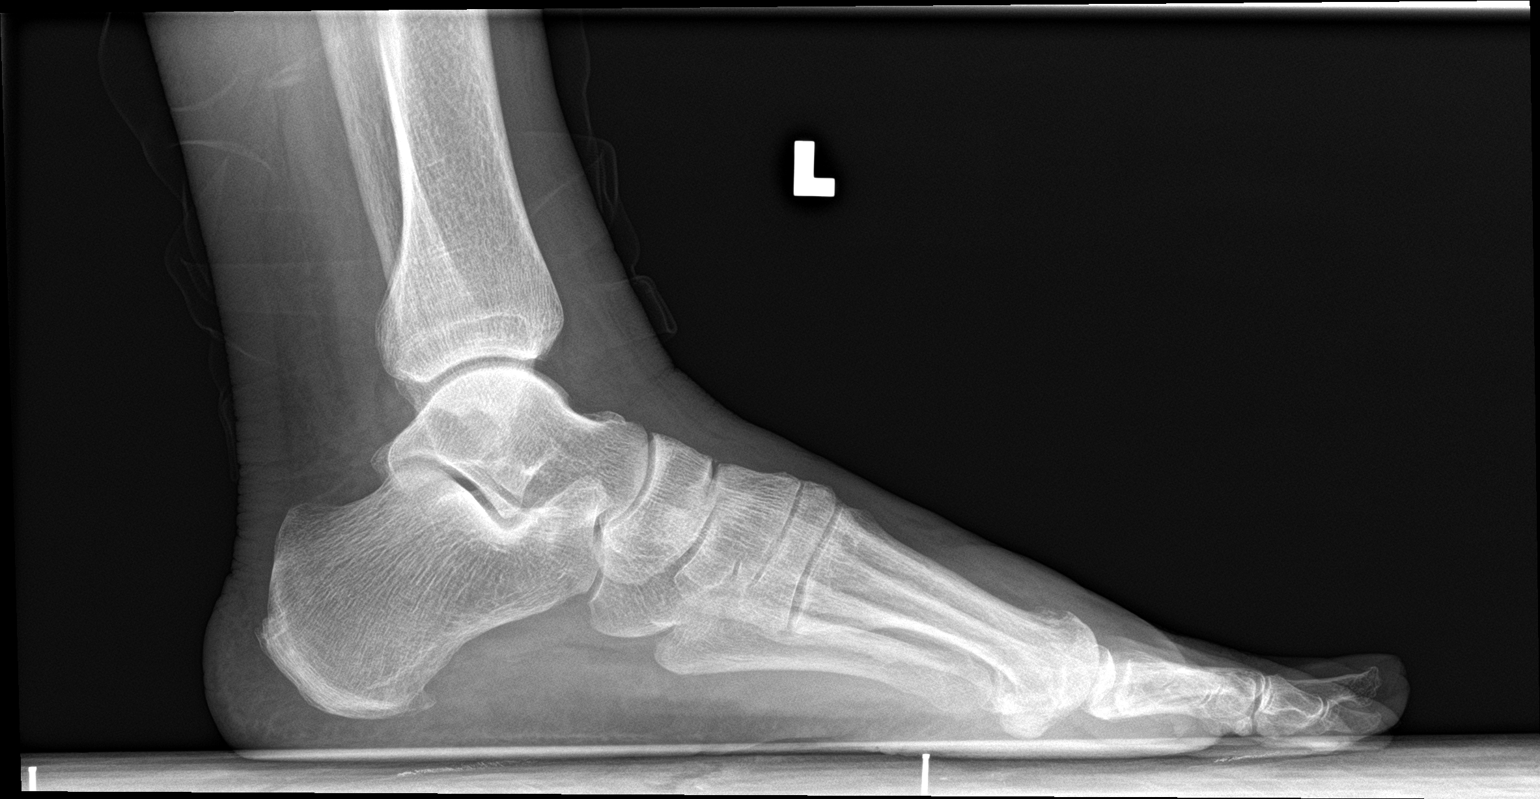

[3 of 3 positions shown; findings below may reference images not displayed]

FINDINGS: There is no evidence of fracture or dislocation. There is no
evidence of arthropathy or other focal bone abnormality. Soft
tissues are unremarkable.
IMPRESSION: Negative.

## 2023-07-28 ENCOUNTER — Ambulatory Visit (INDEPENDENT_AMBULATORY_CARE_PROVIDER_SITE_OTHER): Payer: 59 | Admitting: Family Medicine

## 2023-07-28 ENCOUNTER — Encounter: Payer: Self-pay | Admitting: Family Medicine

## 2023-07-28 VITALS — BP 126/52 | HR 107 | Ht 60.0 in | Wt 135.0 lb

## 2023-07-28 DIAGNOSIS — Z794 Long term (current) use of insulin: Secondary | ICD-10-CM

## 2023-07-28 DIAGNOSIS — R809 Proteinuria, unspecified: Secondary | ICD-10-CM

## 2023-07-28 DIAGNOSIS — E038 Other specified hypothyroidism: Secondary | ICD-10-CM | POA: Diagnosis not present

## 2023-07-28 DIAGNOSIS — E1159 Type 2 diabetes mellitus with other circulatory complications: Secondary | ICD-10-CM

## 2023-07-28 DIAGNOSIS — E1129 Type 2 diabetes mellitus with other diabetic kidney complication: Secondary | ICD-10-CM

## 2023-07-28 DIAGNOSIS — I77819 Aortic ectasia, unspecified site: Secondary | ICD-10-CM | POA: Diagnosis not present

## 2023-07-28 DIAGNOSIS — N1831 Chronic kidney disease, stage 3a: Secondary | ICD-10-CM | POA: Diagnosis not present

## 2023-07-28 DIAGNOSIS — I10 Essential (primary) hypertension: Secondary | ICD-10-CM

## 2023-07-28 DIAGNOSIS — N1832 Chronic kidney disease, stage 3b: Secondary | ICD-10-CM

## 2023-07-28 LAB — POCT GLYCOSYLATED HEMOGLOBIN (HGB A1C): Hemoglobin A1C: 6.9 % — AB (ref 4.0–5.6)

## 2023-07-28 NOTE — Assessment & Plan Note (Signed)
 Continue atorvastatin

## 2023-07-28 NOTE — Assessment & Plan Note (Signed)
 A1c is actually up a little bit at 6.9 which is a little surprising considering that it was 6.6 last time but she reports that she often only eats once a day and a couple times she has actually had some hypoglycemia she also notes that sometimes her sugars are little higher in the morning than when she went to bed some wondering if she is actually having some rebound hyperglycemia after starting to have hypoglycemia especially overnight.  So I did encourage her to stop her insulin  completely which she is still using about twice a week.  And making sure that even if she is not eating a full meal that she is eating a high-protein snack and we discussed some easy options that she can eat, when she is not eating a meal to keep her blood sugars more level and stable.  Encouraged her to keep up the activity level.

## 2023-07-28 NOTE — Patient Instructions (Addendum)
 Stop the insulin  completely and lets see how your numbers look over the next 3 months.  Even if you are only having 1 meal per day and try to eat a couple of protein snacks throughout the day.  Ok to stop the amlodipine  completely.  I will remove from your med list    Plesae keep track of how often you split your Blood pressure pill. It may be we need to adjust your dose.

## 2023-07-28 NOTE — Progress Notes (Signed)
 Established Patient Office Visit  Subjective  Patient ID: Yolanda Fields, female    DOB: 1946-06-02  Age: 78 y.o. MRN: [id number]  Chief Complaint  Patient presents with   Diabetes   Hypertension    HPI Diabetes - no hypoglycemic events. No wounds or sores that are not healing well. No increased thirst or urination. Checking glucose at home. She has had about 3 hypoglycemic events since last here.  Taking medications as prescribed without any side effects.  She follows with podiatry at [facility].  We had increased her Mounjaro  to 12.5 mg at last office visit with the hope of completely stopping her insulin .  At that point she was already skipping occasional doses because of blood sugars under 120. She says she is still using her insulin  2 x per week, using 9-12 units. She has   Hypertension- Pt denies chest pain, SOB, dizziness, or heart palpitations.  Taking meds as directed w/o problems.  Denies medication side effects.  Occ takes a half a tab of losartan  if her BP is around 120. SABRA  She doesn't take hte amlodipine  often.       ROS    Objective:     BP (!) 126/52   Pulse (!) 107   Ht 5' (1.524 m)   Wt 135 lb (61.2 kg)   SpO2 100%   BMI 26.37 kg/m    Physical Exam Vitals and nursing note reviewed.  Constitutional:      Appearance: Normal appearance.  HENT:     Head: Normocephalic and atraumatic.  Eyes:     Conjunctiva/sclera: Conjunctivae normal.  Cardiovascular:     Rate and Rhythm: Normal rate and regular rhythm.  Pulmonary:     Effort: Pulmonary effort is normal.     Breath sounds: Normal breath sounds.  Skin:    General: Skin is warm and dry.  Neurological:     Mental Status: She is alert.  Psychiatric:        Mood and Affect: Mood normal.     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      Relevant Orders   POCT HgB A1C (Completed)   TSH   CMP14+EGFR   Lipid panel   CBC   Essential hypertension   Well controlled.  She says she rarely takes the amlodipine  so I am just going to discontinue it from her med list completely.  She says sometimes she even splits the losartan  if her blood pressure is 120 or lower.  I did remind her that 120 is actually normal.  But I do want her to keep track of how often she is splitting her pill.  It may be that we do need to truly adjust her dose.  Follow up in 3-4 mo  Relevant Orders   TSH   CMP14+EGFR   Lipid panel   CBC   Aortic ectasia (HCC)   Porten to continue to keep blood pressure well-controlled.      Relevant Orders   TSH   CMP14+EGFR   Lipid panel   CBC     Endocrine   Hypothyroidism   To recheck TSH since she has lost a lot of weight.      Relevant Orders   TSH   CMP14+EGFR   Lipid panel   CBC   Controlled type 2 diabetes mellitus with microalbuminuria, with long-term current use of insulin  (HCC)   A1c is actually up a little bit at 6.9 which is a little surprising considering that it was 6.6 last time but she reports that she often only eats once a day and a couple times she has actually had some hypoglycemia she also notes that sometimes her sugars are little higher in the morning than when she went to bed some wondering if she is actually having some rebound hyperglycemia after starting to have hypoglycemia especially overnight.  So I did encourage her to stop her insulin  completely which she is still using  about twice a week.  And making sure that even if she is not eating a full meal that she is eating a high-protein snack and we discussed some easy options that she can eat, when she is not eating a meal to keep her blood sugars more level and stable.  Encouraged her to keep up the activity level.      Relevant Orders   POCT HgB A1C (Completed)     Genitourinary   CKD stage G3a/A1, GFR 45-59 and albumin creatinine ratio <30 mg/g (HCC)   Relevant Orders   TSH   CMP14+EGFR   Lipid panel   CBC    Return in about 3 months (around 11/09/2023) for Diabetes follow-up.    Dorothyann Byars, MD

## 2023-07-28 NOTE — Assessment & Plan Note (Addendum)
 Well controlled.  She says she rarely takes the amlodipine  so I am just going to discontinue it from her med list completely.  She says sometimes she even splits the losartan  if her blood pressure is 120 or lower.  I did remind her that 120 is actually normal.  But I do want her to keep track of how often she is splitting her pill.  It may be that we do need to truly adjust her dose.  Follow up in 3-4 mo

## 2023-07-28 NOTE — Assessment & Plan Note (Signed)
 Porten to continue to keep blood pressure well-controlled.

## 2023-07-28 NOTE — Assessment & Plan Note (Signed)
 To recheck TSH since she has lost a lot of weight.

## 2023-07-29 ENCOUNTER — Other Ambulatory Visit: Payer: Self-pay | Admitting: Family Medicine

## 2023-07-29 DIAGNOSIS — Z794 Long term (current) use of insulin: Secondary | ICD-10-CM

## 2023-07-29 LAB — CMP14+EGFR
ALT: 16 [IU]/L (ref 0–32)
AST: 21 [IU]/L (ref 0–40)
Albumin: 4.3 g/dL (ref 3.8–4.8)
Alkaline Phosphatase: 82 [IU]/L (ref 44–121)
BUN/Creatinine Ratio: 20 (ref 12–28)
BUN: 25 mg/dL (ref 8–27)
Bilirubin Total: 1.5 mg/dL — ABNORMAL HIGH (ref 0.0–1.2)
CO2: 20 mmol/L (ref 20–29)
Calcium: 9.4 mg/dL (ref 8.7–10.3)
Chloride: 99 mmol/L (ref 96–106)
Creatinine, Ser: 1.25 mg/dL — ABNORMAL HIGH (ref 0.57–1.00)
Globulin, Total: 1.7 g/dL (ref 1.5–4.5)
Glucose: 154 mg/dL — ABNORMAL HIGH (ref 70–99)
Potassium: 4.7 mmol/L (ref 3.5–5.2)
Sodium: 138 mmol/L (ref 134–144)
Total Protein: 6 g/dL (ref 6.0–8.5)
eGFR: 44 mL/min/{1.73_m2} — ABNORMAL LOW (ref >59)

## 2023-07-29 LAB — LIPID PANEL
Chol/HDL Ratio: 3.6 {ratio} (ref 0.0–4.4)
Cholesterol, Total: 129 mg/dL (ref 100–199)
HDL: 36 mg/dL — ABNORMAL LOW (ref >39)
LDL Chol Calc (NIH): 63 mg/dL (ref 0–99)
Triglycerides: 178 mg/dL — ABNORMAL HIGH (ref 0–149)
VLDL Cholesterol Cal: 30 mg/dL (ref 5–40)

## 2023-07-29 LAB — CBC
Hematocrit: 44.5 % (ref 34.0–46.6)
Hemoglobin: 14.9 g/dL (ref 11.1–15.9)
MCH: 29 pg (ref 26.6–33.0)
MCHC: 33.5 g/dL (ref 31.5–35.7)
MCV: 87 fL (ref 79–97)
Platelets: 176 10*3/uL (ref 150–450)
RBC: 5.14 x10E6/uL (ref 3.77–5.28)
RDW: 13 % (ref 11.7–15.4)
WBC: 5.3 10*3/uL (ref 3.4–10.8)

## 2023-07-29 LAB — TSH: TSH: 1.04 u[IU]/mL (ref 0.450–4.500)

## 2023-07-29 NOTE — Progress Notes (Signed)
 Call patient: Kidney function is stable.  Liver enzymes are okay.  LDL is under 70 which is great.  Triglycerides up a little bit compared to last year just continue to work on healthy diet and regular exercise.  Thyroid  level looks great and blood count is normal.

## 2023-08-11 ENCOUNTER — Encounter: Payer: Self-pay | Admitting: Family Medicine

## 2023-08-11 ENCOUNTER — Ambulatory Visit (INDEPENDENT_AMBULATORY_CARE_PROVIDER_SITE_OTHER): Payer: 59 | Admitting: Family Medicine

## 2023-08-11 VITALS — BP 130/70 | Ht 60.0 in | Wt 135.0 lb

## 2023-08-11 DIAGNOSIS — Z780 Asymptomatic menopausal state: Secondary | ICD-10-CM

## 2023-08-11 DIAGNOSIS — Z0000 Encounter for general adult medical examination without abnormal findings: Secondary | ICD-10-CM | POA: Diagnosis not present

## 2023-08-11 DIAGNOSIS — Z1211 Encounter for screening for malignant neoplasm of colon: Secondary | ICD-10-CM | POA: Diagnosis not present

## 2023-08-11 NOTE — Progress Notes (Signed)
Subjective:   Yolanda Fields is a 78 y.o. female who presents for Medicare Annual (Subsequent) preventive examination.  Visit Complete: Virtual I connected with  Yolanda Fields on 08/11/23 by a audio enabled telemedicine application and verified that I am speaking with the correct person using two identifiers.  Patient Location: Home  Provider Location: Office/Clinic  I discussed the limitations of evaluation and management by telemedicine. The patient expressed understanding and agreed to proceed.  Vital Signs: Because this visit was a virtual/telehealth visit, some criteria may be missing or patient reported. Any vitals not documented were not able to be obtained and vitals that have been documented are patient reported.  Patient Medicare AWV questionnaire was completed by the patient on n/a; I have confirmed that all information answered by patient is correct and no changes since this date.  Cardiac Risk Factors include: advanced age (>34men, >59 women);diabetes mellitus;dyslipidemia;hypertension     Objective:    Today's Vitals   08/11/23 0815  BP: 130/70  Weight: 135 lb (61.2 kg)  Height: 5' (1.524 m)   Body mass index is 26.37 kg/m.     08/11/2023    8:34 AM 08/07/2022    8:12 AM 09/11/2020    8:20 AM 09/11/2019   11:15 AM 09/07/2018    9:06 AM 09/24/2016   10:23 AM  Advanced Directives  Does Patient Have a Medical Advance Directive? No No No No No No  Would patient like information on creating a medical advance directive? No - Patient declined No - Patient declined No - Patient declined Yes (MAU/Ambulatory/Procedural Areas - Information given) No - Patient declined No - Patient declined    Current Medications (verified) Outpatient Encounter Medications as of 08/11/2023  Medication Sig   AMBULATORY NON FORMULARY MEDICATION Medication Name: True metrix glucometer and strips.  Dx E11.9 for Testing daily.   atorvastatin (LIPITOR) 10 MG tablet Take 1 tablet (10 mg total) by  mouth at bedtime.   cetirizine (ZYRTEC) 10 MG tablet Take 1 tablet (10 mg total) by mouth daily.   Cholecalciferol 2000 UNITS CAPS Take by mouth.   Cinnamon Bark POWD by Does not apply route.   cyanocobalamin 1000 MCG tablet Take 1,000 mcg by mouth daily.   esomeprazole (NEXIUM) 40 MG capsule TAKE ONE CAPSULE BY MOUTH TWO TIMES A DAY BEFORE A MEAL.   glucose blood (TRUE METRIX BLOOD GLUCOSE TEST) test strip TEST DAILY. Dx E11.29([facility])   ipratropium (ATROVENT) 0.03 % nasal spray Place into both nostrils 3 (three) times daily as needed for rhinitis.   Lancets MISC To be used to check blood sugars up to four (4) times daily. DX: E11.29 ([facility])   levothyroxine (SYNTHROID) 125 MCG tablet Take 1 tablet (125 mcg total) by mouth daily.   losartan (COZAAR) 100 MG tablet Take 1 tablet (100 mg total) by mouth daily.   Multiple Vitamin (MULTIVITAMIN) tablet Take 1 tablet by mouth daily.   SURE COMFORT PEN NEEDLES 32G X 4 MM MISC USE AS DIRECTED daily   SYNJARDY XR 25-1000 MG TB24 Take 1 tablet by mouth daily.   tirzepatide (MOUNJARO) 12.5 MG/0.5ML Pen Inject 12.5 mg into the skin once a week.   Travoprost, BAK Free, (TRAVATAN) 0.004 % SOLN ophthalmic solution SMARTSIG:In Eye(s)   furosemide (LASIX) 40 MG tablet Take 1 tablet (40 mg total) by mouth daily as needed. (Patient not taking: Reported on 08/11/2023)   insulin glargine (LANTUS SOLOSTAR) 100 UNIT/ML Solostar Pen Inject 24-28 Units into the skin at bedtime. (  Patient not taking: Reported on 08/11/2023)   No facility-administered encounter medications on file as of 08/11/2023.    Allergies (verified) Tetanus toxoids and Amoxicillin-pot clavulanate   History: Past Medical History:  Diagnosis Date   Diabetes mellitus without complication (HCC)    Hyperlipidemia    Hypertension    Past Surgical History:  Procedure Laterality Date   CESAREAN SECTION     TYMPANOSTOMY TUBE PLACEMENT Right 2023   Family History  Problem  Relation Age of Onset   Bladder Cancer Brother    Cancer Sister    Heart failure Mother 65   Diabetes Mother    Heart failure Brother    Diabetes Brother    Pancreatic cancer Brother    Diabetes Sister    Coronary artery disease Father 5   Heart attack Father    Social History   Socioeconomic History   Marital status: Divorced    Spouse name: Not on file   Number of children: 4   Years of education: HS   Highest education level: 12th grade  Occupational History   Occupation: Retired from [employer]   Tobacco Use   Smoking status: Never   Smokeless tobacco: Never  Vaping Use   Vaping status: Never Used  Substance and Sexual Activity   Alcohol use: No    Alcohol/week: 0.0 standard drinks of alcohol   Drug use: No   Sexual activity: Not Currently  Other Topics Concern   Not on file  Social History Narrative   Patient is retired from [employer].  consumes 2 caffeine drinks daily and rates her diet as good. Walks 2-3 times a week for 20 minutes. Enjoys watching basketball. She use to enjoy riding her bicycle but she hasn't been able to lately.        Social Drivers of Corporate investment banker Strain: Low Risk  (08/11/2023)   Overall Financial Resource Strain (CARDIA)    Difficulty of Paying Living Expenses: Not hard at all  Food Insecurity: No Food Insecurity (08/11/2023)   Hunger Vital Sign    Worried About Running Out of Food in the Last Year: Never true    Ran Out of Food in the Last Year: Never true  Transportation Needs: No Transportation Needs (08/11/2023)   PRAPARE - Administrator, Civil Service (Medical): No    Lack of Transportation (Non-Medical): No  Physical Activity: Insufficiently Active (08/11/2023)   Exercise Vital Sign    Days of Exercise per Week: 3 days    Minutes of Exercise per Session: 10 min  Stress: No Stress Concern Present (08/11/2023)   [employer] of Occupational Health - Occupational Stress Questionnaire    Feeling of Stress : Not  at all  Social Connections: Moderately Isolated (08/11/2023)   Social Connection and Isolation Panel [NHANES]    Frequency of Communication with Friends and Family: More than three times a week    Frequency of Social Gatherings with Friends and Family: More than three times a week    Attends Religious Services: More than 4 times per year    Active Member of [employer] or Organizations: No    Attends Banker Meetings: Never    Marital Status: Divorced    Tobacco Counseling Counseling given: Not Answered   Clinical Intake:  Pre-visit preparation completed: No  Pain : No/denies pain     BMI - recorded: 26.3 Nutritional Status: BMI 25 -29 Overweight Nutritional Risks: None Diabetes: Yes CBG done?: No (137 per pateint  report) Did pt. bring in CBG monitor from home?: No  How often do you need to have someone help you when you read instructions, pamphlets, or other written materials from your doctor or pharmacy?: 1 - Never What is the last grade level you completed in school?: 12  Interpreter Needed?: No      Activities of Daily Living    08/11/2023    8:19 AM  In your present state of health, do you have any difficulty performing the following activities:  Hearing? 1  Comment wears hearing aid  Vision? 1  Comment reading glasses at times  Difficulty concentrating or making decisions? 0  Walking or climbing stairs? 0  Dressing or bathing? 0  Doing errands, shopping? 0  Preparing Food and eating ? N  Using the Toilet? N  In the past six months, have you accidently leaked urine? N  Do you have problems with loss of bowel control? N  Managing your Medications? N  Managing your Finances? N  Housekeeping or managing your Housekeeping? N    Patient Care Team: Agapito Games, MD as PCP - General (Family Medicine) Carmelina Peal, DO as Referring Physician (Ophthalmology) Sonnie Alamo, MD Podiatry  Naida Sleight, MD Dermatologist  Moshe Cipro, MD  [facility] Husbands, Hearing Aid    Indicate any recent Medical Services you may have received from other than [facility] providers in the past year (date may be approximate).     Assessment:   This is a routine wellness examination for [facility].  Hearing/Vision screen Hearing Screening - Comments:: Unable to test, wears hearing aid Vision Screening - Comments:: Unable to test, wears reading glasses.    Goals Addressed             This Visit's Progress    Disease Progression Prevented or Minimized       Trying to get off of insulin.       Depression Screen    08/11/2023    8:32 AM 10/21/2022   10:02 AM 08/07/2022    8:12 AM 05/14/2022    8:53 AM 11/06/2021   10:26 AM 08/07/2021   10:30 AM 02/04/2021    9:19 AM  PHQ 2/9 Scores  PHQ - 2 Score 0 0 0 0 0 0 0    Fall Risk    08/11/2023    8:35 AM 07/28/2023    9:59 AM 10/21/2022   10:01 AM 08/07/2022    8:12 AM 05/14/2022    8:53 AM  Fall Risk   Falls in the past year? 0 0 0 0 0  Number falls in past yr: 0 0 0 0 0  Injury with Fall? 0 0 0 0 0  Risk for fall due to : No Fall Risks No Fall Risks No Fall Risks No Fall Risks No Fall Risks  Follow up Falls evaluation completed Falls evaluation completed Falls evaluation completed Falls evaluation completed Follow up appointment    MEDICARE RISK AT HOME: Medicare Risk at Home Any stairs in or around the home?: No If so, are there any without handrails?: No Home free of loose throw rugs in walkways, pet beds, electrical cords, etc?: Yes Adequate lighting in your home to reduce risk of falls?: Yes Life alert?: No Use of a cane, walker or w/c?: No Grab bars in the bathroom?: Yes Shower chair or bench in shower?: No Elevated toilet seat or a handicapped toilet?: No  TIMED UP AND GO:  Was the test performed?  No  Cognitive Function:        08/11/2023    8:36 AM 08/07/2022    8:20 AM 09/11/2020    8:28 AM 09/11/2019   11:22 AM 09/07/2018    9:10 AM  6CIT Screen  What  Year? 0 points 0 points 0 points 0 points 0 points  What month? 0 points 0 points 0 points 0 points 0 points  What time? 0 points 0 points 0 points 0 points 0 points  Count back from 20 0 points 0 points 0 points 0 points 0 points  Months in reverse 0 points 0 points 0 points 0 points 0 points  Repeat phrase 2 points 0 points 0 points 0 points 2 points  Total Score 2 points 0 points 0 points 0 points 2 points    Immunizations Immunization History  Administered Date(s) Administered   Fluad Quad(high Dose 65+) 05/04/2019, 04/30/2020   Influenza, High Dose Seasonal PF 04/13/2017, 05/24/2018   Influenza, Seasonal, Injecte, Preservative Fre 06/10/2011, 04/21/2012   Influenza,inj,Quad PF,6+ Mos 04/20/2013, 03/27/2016   Influenza-Unspecified 04/29/2015   Pneumococcal Conjugate-13 07/23/2014, 12/26/2015, 04/20/2016   Pneumococcal Polysaccharide-23 11/28/2009   Zoster, Live 04/25/2012    TDAP status: Due, Education has been provided regarding the importance of this vaccine. Advised may receive this vaccine at local pharmacy or Health Dept. Aware to provide a copy of the vaccination record if obtained from local pharmacy or Health Dept. Verbalized acceptance and understanding.  Flu Vaccine status: Due, Education has been provided regarding the importance of this vaccine. Advised may receive this vaccine at local pharmacy or Health Dept. Aware to provide a copy of the vaccination record if obtained from local pharmacy or Health Dept. Verbalized acceptance and understanding.  Pneumococcal vaccine status: Up to date  Covid-19 vaccine status: Information provided on how to obtain vaccines.   Qualifies for Shingles Vaccine? Yes   Zostavax completed Yes   Shingrix Completed?: No.    Education has been provided regarding the importance of this vaccine. Patient has been advised to call insurance company to determine out of pocket expense if they have not yet received this vaccine. Advised may also  receive vaccine at local pharmacy or Health Dept. Verbalized acceptance and understanding.  Screening Tests Health Maintenance  Topic Date Due   Zoster Vaccines- Shingrix (1 of 2) 08/15/2023 (Originally 11/21/1964)   INFLUENZA VACCINE  10/18/2023 (Originally 02/18/2023)   COVID-19 Vaccine (1) 05/12/2024 (Originally 11/20/1950)   MAMMOGRAM  11/19/2023   HEMOGLOBIN A1C  01/25/2024   Diabetic kidney evaluation - Urine ACR  04/26/2024   FOOT EXAM  04/26/2024   OPHTHALMOLOGY EXAM  05/11/2024   Diabetic kidney evaluation - eGFR measurement  07/27/2024   Medicare Annual Wellness (AWV)  08/10/2024   DEXA SCAN  10/07/2026   Pneumonia Vaccine 52+ Years old  Completed   Hepatitis C Screening  Completed   HPV VACCINES  Aged Out   DTaP/Tdap/Td  Discontinued   Colonoscopy  Discontinued    Health Maintenance  There are no preventive care reminders to display for this patient.   Colorectal cancer screening: Will order Cologuard   Mammogram status: Completed 11/19/2022. Repeat every year  Bone Density status: Ordered 08/11/2023. Pt provided with contact info and advised to call to schedule appt.  Lung Cancer Screening: (Low Dose CT Chest recommended if Age 38-80 years, 20 pack-year currently smoking OR have quit w/in 15years.) does not qualify.   Lung Cancer Screening Referral: n/a  Additional Screening:  Hepatitis C Screening: does qualify;  Completed 03/27/2016  Vision Screening: Recommended annual ophthalmology exams for early detection of glaucoma and other disorders of the eye. Is the patient up to date with their annual eye exam?  Yes  Who is the provider or what is the name of the office in which the patient attends annual eye exams? Dr. Tiburcio Pea, [facility]  If pt is not established with a provider, would they like to be referred to a provider to establish care? No .   Dental Screening: Recommended annual dental exams for proper oral hygiene  Diabetic Foot Exam: Diabetic Foot Exam:  Completed 04/27/2023  Community Resource Referral / Chronic Care Management: CRR required this visit?  No   CCM required this visit?  No     Plan:     I have personally reviewed and noted the following in the patient's chart:   Medical and social history Use of alcohol, tobacco or illicit drugs  Current medications and supplements including opioid prescriptions. Patient is not currently taking opioid prescriptions. Functional ability and status Nutritional status Physical activity Advanced directives List of other physicians Hospitalizations, surgeries, and ER visits in previous 12 months: no  Vitals Screenings to include cognitive, depression, and falls Referrals and appointments: Bone density and Cologuard   In addition, I have reviewed and discussed with patient certain preventive protocols, quality metrics, and best practice recommendations. A written personalized care plan for preventive services as well as general preventive health recommendations were provided to patient.     Novella Olive, FNP   08/11/2023   After Visit Summary: (MyChart) Due to this being a telephonic visit, the after visit summary with patients personalized plan was offered to patient via MyChart   Follow-up with PCP as scheduled. Vaccines due Influenza, Covid, Shingrix at local pharmacy. Allergic to tetanus. Referrals today: Cologuard, and bone density.

## 2023-08-25 ENCOUNTER — Ambulatory Visit: Payer: 59

## 2023-08-25 DIAGNOSIS — Z780 Asymptomatic menopausal state: Secondary | ICD-10-CM

## 2023-08-25 DIAGNOSIS — Z13820 Encounter for screening for osteoporosis: Secondary | ICD-10-CM | POA: Diagnosis not present

## 2023-08-25 DIAGNOSIS — M810 Age-related osteoporosis without current pathological fracture: Secondary | ICD-10-CM | POA: Diagnosis not present

## 2023-08-30 DIAGNOSIS — Z1211 Encounter for screening for malignant neoplasm of colon: Secondary | ICD-10-CM | POA: Diagnosis not present

## 2023-09-01 ENCOUNTER — Encounter: Payer: Self-pay | Admitting: Family Medicine

## 2023-09-01 DIAGNOSIS — M810 Age-related osteoporosis without current pathological fracture: Secondary | ICD-10-CM | POA: Insufficient documentation

## 2023-09-01 NOTE — Progress Notes (Signed)
Call patient: Bone density test shows a T-score of -2.9 this is considered osteoporosis.  Slight increase in bone density in the spine but a slight decrease in the hips.  Make sure getting adequate calcium with vitamin D intake daily in addition to doing some resistance exercises with weights or walking   The current recommendation for osteoporosis treatment includes:   #1 calcium-total of 1200 mg of calcium daily.  If you eat a very calcium rich diet you may be able to obtain that without a supplement.  If not, then I recommend calcium 500 mg twice a day.  There are several products over-the-counter such as Caltrate D and Viactiv chews which are great options that contain calcium and vitamin D. #2 vitamin D-recommend 800 international units daily. #3 exercise-recommend 30 minutes of weightbearing exercise 3 days a week.  Resistance training ,such as doing bands and light weights, can be particularly helpful. #4 medication-if you are not currently on a bone builder, also called a bisphosphonate, then this has been shown to be very helpful in maintaining bone strength, preventing further thinning of the bones, and reducing your risk for fractures.  I would highly recommend that you consider starting 1 of these medications.  If you are okay with that then please let us know and we will send one to your pharmacy.  If you would like to discuss further we are happy to make an appointment for you so that we can go over options for treatment.

## 2023-09-03 DIAGNOSIS — M546 Pain in thoracic spine: Secondary | ICD-10-CM | POA: Diagnosis not present

## 2023-09-03 DIAGNOSIS — E119 Type 2 diabetes mellitus without complications: Secondary | ICD-10-CM | POA: Diagnosis not present

## 2023-09-05 LAB — COLOGUARD: COLOGUARD: NEGATIVE

## 2023-09-06 NOTE — Progress Notes (Signed)
 Great news! Your Cologuard test is negative.  Recommend repeat colon cancer screening in 3 years.

## 2023-09-07 DIAGNOSIS — M546 Pain in thoracic spine: Secondary | ICD-10-CM | POA: Diagnosis not present

## 2023-09-07 DIAGNOSIS — E119 Type 2 diabetes mellitus without complications: Secondary | ICD-10-CM | POA: Diagnosis not present

## 2023-10-02 ENCOUNTER — Encounter: Payer: Self-pay | Admitting: Emergency Medicine

## 2023-10-02 ENCOUNTER — Ambulatory Visit
Admission: EM | Admit: 2023-10-02 | Discharge: 2023-10-02 | Disposition: A | Discharge status: Home / Self Care | Attending: Family Medicine | Admitting: Family Medicine

## 2023-10-02 DIAGNOSIS — B029 Zoster without complications: Secondary | ICD-10-CM | POA: Diagnosis not present

## 2023-10-02 MED ORDER — VALACYCLOVIR HCL 1 G PO TABS: 1000.0000 mg | ORAL_TABLET | Freq: Three times a day (TID) | ORAL | 0 refills | Status: DC

## 2023-10-02 NOTE — ED Triage Notes (Signed)
 Patient c/o rash that started on the right side of chest, neck, shoulder and back x 4 days.  Patient started Valtrex on Tuesday.  The rash does not itch but painful.  Feels like she stopped breaking out on the Valtrex but still having pain.

## 2023-10-02 NOTE — Discharge Instructions (Signed)
 Take the valacyclovir 3 times a day.  Take for 7 to 10 days until rash clears May take Advil as needed for pain

## 2023-10-02 NOTE — ED Provider Notes (Signed)
 [facility]    CSN: [id number] Arrival date & time: 10/02/23  0901      History   Chief Complaint Chief Complaint  Patient presents with   Rash    HPI Yolanda Fields is a 78 y.o. female.   HPI  Patient has a rash has been breaking out over the last 4 days.  She had leftover Valtrex and has been taking 1000 mg 3 times a day.  She thinks that this is causing the rash to improve, but she has run out.  She is taking Advil for pain.  She states she has had shingles vaccination in the past.  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   Abdominal aortic ectasia (HCC) 11/05/2020   History of COVID-19 PNA 03/29/2020   B12 deficiency 12/29/2019   Vitamin D deficiency 02/21/2018   Family history of colon cancer 02/21/2018   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      Home Medications    Prior to Admission  medications   Medication Sig Start Date End Date Taking? Authorizing Provider  AMBULATORY NON FORMULARY MEDICATION Medication Name: True metrix glucometer and strips.  Dx E11.9 for Testing daily. 02/04/21  Yes Agapito Games, MD  atorvastatin (LIPITOR) 10 MG tablet Take 1 tablet (10 mg total) by mouth at bedtime. 04/12/23  Yes Agapito Games, MD  cetirizine (ZYRTEC) 10 MG tablet Take 1 tablet (10 mg total) by mouth daily. 02/09/22  Yes Agapito Games, MD  Cholecalciferol 2000 UNITS CAPS Take by mouth.   Yes [provider]  Thressa Sheller POWD by Does not apply route.   Yes [provider]  cyanocobalamin 1000 MCG tablet Take 1,000 mcg by mouth daily.   Yes [provider]  esomeprazole (NEXIUM) 40 MG capsule TAKE ONE CAPSULE BY MOUTH TWO TIMES A DAY BEFORE A MEAL. 04/12/23  Yes Agapito Games, MD  furosemide (LASIX) 40 MG tablet Take 1 tablet (40 mg total) by mouth daily as needed. 05/11/23  Yes Agapito Games, MD  glucose blood (TRUE METRIX BLOOD GLUCOSE TEST) test strip TEST DAILY. Dx E11.29([facility]) 05/14/22  Yes Agapito Games, MD  insulin glargine (LANTUS SOLOSTAR) 100 UNIT/ML Solostar Pen Inject 24-28 Units into the skin at bedtime. 09/14/22  Yes Agapito Games, MD  ipratropium (ATROVENT) 0.03 % nasal spray Place into both nostrils 3 (three) times daily as needed  for rhinitis. 04/16/23  Yes Shefferly, Rennie Natter, PA-C  Lancets MISC To be used to check blood sugars up to four (4) times daily. DX: E11.29 [facility]) 05/07/21  Yes Agapito Games, MD  levothyroxine (SYNTHROID) 125 MCG tablet Take 1 tablet (125 mcg total) by mouth daily. 04/12/23  Yes Agapito Games, MD  losartan (COZAAR) 100 MG tablet Take 1 tablet (100 mg total) by mouth daily. 04/12/23  Yes Agapito Games, MD  Multiple Vitamin (MULTIVITAMIN) tablet Take 1 tablet by mouth daily.   Yes [provider]  SURE COMFORT  PEN NEEDLES 32G X 4 MM MISC USE AS DIRECTED daily 04/20/23  Yes Agapito Games, MD  SYNJARDY XR 25-1000 MG TB24 Take 1 tablet by mouth daily. 07/30/23  Yes Agapito Games, MD  tirzepatide [facility]) 12.5 MG/0.5ML Pen Inject 12.5 mg into the skin once a week. 04/27/23  Yes Agapito Games, MD  Travoprost, BAK Free, (TRAVATAN) 0.004 % SOLN ophthalmic solution SMARTSIG:In Eye(s) 06/20/20  Yes [provider]  valACYclovir (VALTREX) 1000 MG tablet Take 1 tablet (1,000 mg total) by mouth 3 (three) times daily. 10/02/23  Yes Eustace Moore, MD    Family History Family History  Problem Relation Age of Onset   Bladder Cancer Brother    Cancer Sister    Heart failure Mother 22   Diabetes Mother    Heart failure Brother    Diabetes Brother    Pancreatic cancer Brother    Diabetes Sister    Coronary artery disease Father 64   Heart attack Father     Social History Social History   Tobacco Use   Smoking status: Never   Smokeless tobacco: Never  Vaping Use   Vaping status: Never Used  Substance Use Topics   Alcohol use: No    Alcohol/week: 0.0 standard drinks of alcohol   Drug use: No     Allergies   Tetanus toxoids and Amoxicillin-pot clavulanate   Review of Systems Review of Systems See HPI  Physical Exam Triage Vital Signs ED Triage Vitals  Encounter Vitals Group     BP 10/02/23 0925 128/74     Systolic BP Percentile --      Diastolic BP Percentile --      Pulse Rate 10/02/23 0925 (!) 103     Resp 10/02/23 0925 18     Temp 10/02/23 0925 97.6 F (36.4 C)     Temp Source 10/02/23 0925 Oral     SpO2 10/02/23 0925 99 %     Weight 10/02/23 0927 134 lb 14.7 oz (61.2 kg)     Height 10/02/23 0927 5' (1.524 m)     Head Circumference --      Peak Flow --      Pain Score 10/02/23 0927 0     Pain Loc --      Pain Education --      Exclude from Growth Chart --    No data found.  Updated Vital Signs BP 128/74 (BP Location: Right Arm)   Pulse  (!) 103   Temp 97.6 F (36.4 C) (Oral)   Resp 18   Ht 5' (1.524 m)   Wt 61.2 kg   SpO2 99%   BMI 26.35 kg/m   Physical Exam Neck:      Comments: Patches of vesicles and crusting lesions in the vicinity described     UC Treatments / Results  Labs (all labs ordered are listed, but only abnormal  results are displayed) Labs Reviewed - No data to display  EKG   Radiology No results found.  Procedures Procedures (including critical care time)  Medications Ordered in UC Medications - No data to display  Initial Impression / Assessment and Plan / UC Course  I have reviewed the triage vital signs and the nursing notes.  Pertinent labs & imaging results that were available during my care of the patient were reviewed by me and considered in my medical decision making (see chart for details).     Shingles.  Discussed Final Clinical Impressions(s) / UC Diagnoses   Final diagnoses:  Herpes zoster without complication     Discharge Instructions      Take the valacyclovir 3 times a day.  Take for 7 to 10 days until rash clears May take Advil as needed for pain    ED Prescriptions     Medication Sig Dispense Auth. Provider   valACYclovir (VALTREX) 1000 MG tablet Take 1 tablet (1,000 mg total) by mouth 3 (three) times daily. 21 tablet Eustace Moore, MD      PDMP not reviewed this encounter.   Eustace Moore, MD 10/02/23 [id number]

## 2023-10-11 DIAGNOSIS — H6123 Impacted cerumen, bilateral: Secondary | ICD-10-CM | POA: Diagnosis not present

## 2023-10-11 DIAGNOSIS — B0229 Other postherpetic nervous system involvement: Secondary | ICD-10-CM | POA: Diagnosis not present

## 2023-10-15 ENCOUNTER — Other Ambulatory Visit: Payer: Self-pay | Admitting: Family Medicine

## 2023-10-15 DIAGNOSIS — E1159 Type 2 diabetes mellitus with other circulatory complications: Secondary | ICD-10-CM

## 2023-10-26 ENCOUNTER — Ambulatory Visit (INDEPENDENT_AMBULATORY_CARE_PROVIDER_SITE_OTHER): Payer: 59 | Admitting: Family Medicine

## 2023-10-26 ENCOUNTER — Encounter: Payer: Self-pay | Admitting: Family Medicine

## 2023-10-26 VITALS — BP 134/63 | HR 104 | Ht 60.0 in | Wt 128.0 lb

## 2023-10-26 DIAGNOSIS — E038 Other specified hypothyroidism: Secondary | ICD-10-CM | POA: Diagnosis not present

## 2023-10-26 DIAGNOSIS — I10 Essential (primary) hypertension: Secondary | ICD-10-CM

## 2023-10-26 DIAGNOSIS — Z794 Long term (current) use of insulin: Secondary | ICD-10-CM | POA: Diagnosis not present

## 2023-10-26 DIAGNOSIS — Z7985 Long-term (current) use of injectable non-insulin antidiabetic drugs: Secondary | ICD-10-CM

## 2023-10-26 DIAGNOSIS — H401132 Primary open-angle glaucoma, bilateral, moderate stage: Secondary | ICD-10-CM | POA: Diagnosis not present

## 2023-10-26 DIAGNOSIS — N1831 Chronic kidney disease, stage 3a: Secondary | ICD-10-CM

## 2023-10-26 DIAGNOSIS — E1159 Type 2 diabetes mellitus with other circulatory complications: Secondary | ICD-10-CM

## 2023-10-26 LAB — POCT GLYCOSYLATED HEMOGLOBIN (HGB A1C): Hemoglobin A1C: 7.2 % — AB (ref 4.0–5.6)

## 2023-10-26 NOTE — Assessment & Plan Note (Signed)
To recheck TSH.

## 2023-10-26 NOTE — Assessment & Plan Note (Signed)
To recheck renal function. 

## 2023-10-26 NOTE — Assessment & Plan Note (Signed)
 Pressure looks good today.  Continue current regimen call if any problems or concerns.

## 2023-10-26 NOTE — Assessment & Plan Note (Signed)
 A1c went up slightly to 7.2.  We discussed options of adjusting the medication.  She would like to continue with her current regimen for now Mounjaro 12.5 mg and Synjardy.  She is gena just try to continue to work on diet and be a little bit more consistent with her medications.  You have some room to go up on the Mounjaro to 15 mg but she declines today and says she will think about it next time.

## 2023-10-26 NOTE — Progress Notes (Signed)
 Established Patient Office Visit  Subjective  Patient ID: Yolanda Fields, female    DOB: [birth year]  Age: 78 y.o. MRN: [id number]  Chief Complaint  Patient presents with   Diabetes    HPI Here for DM f/u: She says her sugars have actually been pretty good until recently when they started elevating and then she started having some right flank pain.  She went to have that checked out she went to urgent care at [facility] and brought in a copy of the lab work that they did on February 28.  The right flank pain seem to eventually ease off.  But then around mid March she started having some right shoulder pain.  She says since I last saw her she ended up going to the urgent care about 3 weeks ago and was diagnosed with shingles.  She still has a splotchy colored rash over her left shoulder anteriorly and posteriorly her is still quite painful.  She started the valacyclovir but that ended up stopping it and wonders if maybe she stopped it a little bit too much prematurely and wonders if she should go back on it..    To me to take a look at her foot today she has noticed that the outer part of her left foot near the sole just looks dark and dusky.  No injury or trauma no rash it does not feel itchy or irritated she just noticed a little bit of discoloration there and it is not over the toes.     ROS    Objective:     BP 134/63   Pulse (!) 104   Ht 5' (1.524 m)   Wt 128 lb (58.1 kg)   SpO2 98%   BMI 25.00 kg/m    Physical Exam Vitals and nursing note reviewed.  Constitutional:      Appearance: Normal appearance.  HENT:     Head: Normocephalic and atraumatic.  Eyes:     Conjunctiva/sclera: Conjunctivae normal.  Cardiovascular:     Rate and Rhythm: Normal rate and regular rhythm.  Pulmonary:     Effort: Pulmonary effort is normal.     Breath sounds: Normal breath sounds.  Skin:    General: Skin is warm and dry.  Neurological:     Mental Status: She is alert.  Psychiatric:         Mood and Affect: Mood normal.      Results for orders placed or performed in visit on 10/26/23  POCT HgB A1C  Result Value Ref Range   Hemoglobin A1C 7.2 (A) 4.0 - 5.6 %   HbA1c POC (<> result, manual entry)     HbA1c, POC (prediabetic range)     HbA1c, POC (controlled diabetic range)        The ASCVD Risk score (Arnett DK, et al., 2019) failed to calculate for the following reasons:   The valid total cholesterol range is 130 to 320 mg/dL    Assessment & Plan:   Problem List Items Addressed This Visit       Cardiovascular and Mediastinum   Type 2 diabetes mellitus with vascular disease (HCC)   A1c went up slightly to 7.2.  We discussed options of adjusting the medication.  She would like to continue with her current regimen for now Mounjaro 12.5 mg and Synjardy.  She is gena just try to continue to work on diet and be a little bit more consistent with her medications.  You have some room to go  up on the [facility] to 15 mg but she declines today and says she will think about it next time.      Relevant Orders   POCT HgB A1C (Completed)   CMP14+EGFR   CBC with Differential/Platelet   Sedimentation rate   VITAMIN D 25 Hydroxy (Vit-D Deficiency, Fractures)   TSH   Essential hypertension - Primary   Pressure looks good today.  Continue current regimen call if any problems or concerns.      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      Relevant Orders   CMP14+EGFR   CBC with Differential/Platelet   Sedimentation rate   VITAMIN D 25 Hydroxy  (Vit-D Deficiency, Fractures)   TSH    Clear why the outer part of her left foot looks a little dusky it is a little unusual if it were her toes then I would recommend further evaluation with ABIs but it is really just the lateral part of her foot.  Encouraged her to keep it elevated I do not see any significant swelling or rash we will keep an eye on it over the next couple of weeks and see if it continues to improve.   Shingles-encouraged her to restart the valacyclovir and finish the course.  We also discussed that I could certainly put her on something a little stronger like gabapentin for pain relief if she would like.  She said she would prefer not to she is just going to stick with her anti-inflammatory as needed.   Return in about 14 weeks (around 02/01/2024) for Diabetes follow-up.    Nani Gasser, MD

## 2023-10-27 ENCOUNTER — Telehealth: Payer: Self-pay

## 2023-10-27 LAB — CBC WITH DIFFERENTIAL/PLATELET
Basophils Absolute: 0 10*3/uL (ref 0.0–0.2)
Basos: 0 %
EOS (ABSOLUTE): 0.1 10*3/uL (ref 0.0–0.4)
Eos: 1 %
Hematocrit: 42.5 % (ref 34.0–46.6)
Hemoglobin: 14.1 g/dL (ref 11.1–15.9)
Immature Grans (Abs): 0 10*3/uL (ref 0.0–0.1)
Immature Granulocytes: 0 %
Lymphocytes Absolute: 1 10*3/uL (ref 0.7–3.1)
Lymphs: 13 %
MCH: 28.9 pg (ref 26.6–33.0)
MCHC: 33.2 g/dL (ref 31.5–35.7)
MCV: 87 fL (ref 79–97)
Monocytes Absolute: 0.8 10*3/uL (ref 0.1–0.9)
Monocytes: 10 %
Neutrophils Absolute: 6 10*3/uL (ref 1.4–7.0)
Neutrophils: 76 %
Platelets: 209 10*3/uL (ref 150–450)
RBC: 4.88 x10E6/uL (ref 3.77–5.28)
RDW: 13.4 % (ref 11.7–15.4)
WBC: 7.9 10*3/uL (ref 3.4–10.8)

## 2023-10-27 LAB — CMP14+EGFR
ALT: 17 IU/L (ref 0–32)
AST: 17 IU/L (ref 0–40)
Albumin: 4.3 g/dL (ref 3.8–4.8)
Alkaline Phosphatase: 85 IU/L (ref 44–121)
BUN/Creatinine Ratio: 15 (ref 12–28)
BUN: 15 mg/dL (ref 8–27)
Bilirubin Total: 1.2 mg/dL (ref 0.0–1.2)
CO2: 22 mmol/L (ref 20–29)
Calcium: 9.8 mg/dL (ref 8.7–10.3)
Chloride: 99 mmol/L (ref 96–106)
Creatinine, Ser: 1.02 mg/dL — ABNORMAL HIGH (ref 0.57–1.00)
Globulin, Total: 1.8 g/dL (ref 1.5–4.5)
Glucose: 192 mg/dL — ABNORMAL HIGH (ref 70–99)
Potassium: 4.9 mmol/L (ref 3.5–5.2)
Sodium: 138 mmol/L (ref 134–144)
Total Protein: 6.1 g/dL (ref 6.0–8.5)
eGFR: 56 mL/min/{1.73_m2} — ABNORMAL LOW (ref >59)

## 2023-10-27 LAB — VITAMIN D 25 HYDROXY (VIT D DEFICIENCY, FRACTURES): Vit D, 25-Hydroxy: 75 ng/mL (ref 30.0–100.0)

## 2023-10-27 LAB — TSH: TSH: 1.4 u[IU]/mL (ref 0.450–4.500)

## 2023-10-27 LAB — SEDIMENTATION RATE: Sed Rate: 2 mm/h (ref 0–40)

## 2023-10-27 NOTE — Progress Notes (Signed)
 Call pt: kidney function looks better.  Liver and blood count are normal.  Inflammatory markers normal.  Vitamin D looks great.  Thyroid looks perfect.

## 2023-10-27 NOTE — Telephone Encounter (Signed)
 Copied from CRM [id number]. Topic: Clinical - Lab/Test Results >> Oct 27, 2023  3:05 PM Corin V wrote: Reason for CRM: Patient called to get lab results. Read provider note verbatim. Patient verbalized understanding and has no additional questions or concerns at this time.

## 2023-11-04 ENCOUNTER — Other Ambulatory Visit: Payer: Self-pay | Admitting: Family Medicine

## 2023-11-04 NOTE — Telephone Encounter (Signed)
 Please advise if refill request is appropriate. Thanks in advance. Currently #21.

## 2023-11-04 NOTE — Progress Notes (Signed)
 [facility] Quality Team Note  Name: Yolanda Fields Date of Birth: Aug 29, 1945 MRN: [id number] Date: 11/04/2023  [facility] Quality Team has reviewed this patient's chart, please see recommendations below:  [facility] Quality Other; (Patient needs urine microalbumin/creatinine ratio completed for gap closure. Please address at upcoming 02/01/2024 appt)

## 2023-11-08 DIAGNOSIS — M2042 Other hammer toe(s) (acquired), left foot: Secondary | ICD-10-CM | POA: Diagnosis not present

## 2023-11-08 DIAGNOSIS — L859 Epidermal thickening, unspecified: Secondary | ICD-10-CM | POA: Diagnosis not present

## 2023-11-08 DIAGNOSIS — E1151 Type 2 diabetes mellitus with diabetic peripheral angiopathy without gangrene: Secondary | ICD-10-CM | POA: Diagnosis not present

## 2023-11-08 DIAGNOSIS — Z794 Long term (current) use of insulin: Secondary | ICD-10-CM | POA: Diagnosis not present

## 2023-11-08 DIAGNOSIS — L602 Onychogryphosis: Secondary | ICD-10-CM | POA: Diagnosis not present

## 2023-11-08 DIAGNOSIS — M2041 Other hammer toe(s) (acquired), right foot: Secondary | ICD-10-CM | POA: Diagnosis not present

## 2023-11-27 ENCOUNTER — Other Ambulatory Visit: Payer: Self-pay | Admitting: Family Medicine

## 2023-11-27 DIAGNOSIS — E038 Other specified hypothyroidism: Secondary | ICD-10-CM

## 2023-12-20 DIAGNOSIS — B0229 Other postherpetic nervous system involvement: Secondary | ICD-10-CM | POA: Diagnosis not present

## 2023-12-20 DIAGNOSIS — H7201 Central perforation of tympanic membrane, right ear: Secondary | ICD-10-CM | POA: Diagnosis not present

## 2023-12-20 DIAGNOSIS — H6123 Impacted cerumen, bilateral: Secondary | ICD-10-CM | POA: Diagnosis not present

## 2023-12-21 ENCOUNTER — Other Ambulatory Visit: Payer: Self-pay | Admitting: Family Medicine

## 2023-12-21 DIAGNOSIS — I10 Essential (primary) hypertension: Secondary | ICD-10-CM

## 2024-01-04 DIAGNOSIS — Z794 Long term (current) use of insulin: Secondary | ICD-10-CM | POA: Diagnosis not present

## 2024-01-04 DIAGNOSIS — E1151 Type 2 diabetes mellitus with diabetic peripheral angiopathy without gangrene: Secondary | ICD-10-CM | POA: Diagnosis not present

## 2024-01-04 DIAGNOSIS — L600 Ingrowing nail: Secondary | ICD-10-CM | POA: Diagnosis not present

## 2024-01-07 ENCOUNTER — Other Ambulatory Visit: Payer: Self-pay | Admitting: Family Medicine

## 2024-01-07 DIAGNOSIS — I10 Essential (primary) hypertension: Secondary | ICD-10-CM

## 2024-02-01 ENCOUNTER — Ambulatory Visit (INDEPENDENT_AMBULATORY_CARE_PROVIDER_SITE_OTHER): Admitting: Family Medicine

## 2024-02-01 VITALS — BP 118/56 | HR 96 | Ht 60.0 in | Wt 126.0 lb

## 2024-02-01 DIAGNOSIS — N1831 Chronic kidney disease, stage 3a: Secondary | ICD-10-CM

## 2024-02-01 DIAGNOSIS — E1159 Type 2 diabetes mellitus with other circulatory complications: Secondary | ICD-10-CM | POA: Diagnosis not present

## 2024-02-01 DIAGNOSIS — E1129 Type 2 diabetes mellitus with other diabetic kidney complication: Secondary | ICD-10-CM | POA: Diagnosis not present

## 2024-02-01 DIAGNOSIS — I10 Essential (primary) hypertension: Secondary | ICD-10-CM

## 2024-02-01 DIAGNOSIS — R809 Proteinuria, unspecified: Secondary | ICD-10-CM | POA: Diagnosis not present

## 2024-02-01 DIAGNOSIS — Z794 Long term (current) use of insulin: Secondary | ICD-10-CM | POA: Diagnosis not present

## 2024-02-01 LAB — POCT GLYCOSYLATED HEMOGLOBIN (HGB A1C): Hemoglobin A1C: 6.7 % — AB (ref 4.0–5.6)

## 2024-02-01 MED ORDER — LOSARTAN POTASSIUM 100 MG PO TABS: 50.0000 mg | ORAL_TABLET | Freq: Every day | ORAL | Status: DC

## 2024-02-01 NOTE — Patient Instructions (Signed)
 OK to stop your insulin .  When ready to refill your losartan  I can change to 50mg  dose.

## 2024-02-01 NOTE — Progress Notes (Unsigned)
   Established Patient Office Visit  Subjective  Patient ID: Yolanda Fields, female    DOB: [birth year]  Age: 78 y.o. MRN: [id number]  No chief complaint on file.   HPI  Hypertension- Pt denies chest pain, SOB, dizziness, or heart palpitations.  Taking meds as directed w/o problems.  Denies medication side effects.    Diabetes - no hypoglycemic events. No wounds or sores that are not healing well. No increased thirst or urination. Checking glucose at home. Taking medications as prescribed without any side effects.  She has an upcoing derm appt for her feet.    Still having shingles pain on her left shoulder and now going down her left upper back.  Rash has resolved.    {History (Optional):23778}  ROS    Objective:     There were no vitals taken for this visit. {Vitals History (Optional):23777}  Physical Exam   No results found for any visits on 02/01/24.  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    Dorothyann Byars, MD

## 2024-02-01 NOTE — Assessment & Plan Note (Signed)
 Ok to continue to cut losartan  in half.

## 2024-02-03 DIAGNOSIS — M2042 Other hammer toe(s) (acquired), left foot: Secondary | ICD-10-CM | POA: Diagnosis not present

## 2024-02-03 DIAGNOSIS — E1151 Type 2 diabetes mellitus with diabetic peripheral angiopathy without gangrene: Secondary | ICD-10-CM | POA: Diagnosis not present

## 2024-02-03 DIAGNOSIS — Z794 Long term (current) use of insulin: Secondary | ICD-10-CM | POA: Diagnosis not present

## 2024-02-03 DIAGNOSIS — L602 Onychogryphosis: Secondary | ICD-10-CM | POA: Diagnosis not present

## 2024-02-03 DIAGNOSIS — M2041 Other hammer toe(s) (acquired), right foot: Secondary | ICD-10-CM | POA: Diagnosis not present

## 2024-02-04 ENCOUNTER — Encounter: Payer: Self-pay | Admitting: Family Medicine

## 2024-02-04 MED ORDER — SYNJARDY XR 25-1000 MG PO TB24: 1.0000 | ORAL_TABLET | Freq: Every day | ORAL | 1 refills | Status: DC

## 2024-02-04 MED ORDER — LOSARTAN POTASSIUM 50 MG PO TABS
50.0000 mg | ORAL_TABLET | Freq: Every day | ORAL | 0 refills | Status: AC
Start: 2024-02-04 — End: ?

## 2024-02-04 NOTE — Assessment & Plan Note (Signed)
 Daily his losartan  at 50 mg.  She has a plethora of the 100s so she has been splitting them and she really feels like she probably has enough to get into the fall.  So I did go ahead and changed her medication so it says that she is taking 50 so that when we get ready to refill it we can make sure that were not just refilling the 100s.  Can

## 2024-02-04 NOTE — Assessment & Plan Note (Signed)
 A1c looks great at 6.7.  Continue current regimen.  Okay to remove the Lantus  from her medication list.  She feels like she is up-to-date on her testing supplies.  Continue Synjardy , Mounjaro  at 12.5 mg, ARB and statin.  Lab Results  Component Value Date   HGBA1C 6.7 (A) 02/01/2024

## 2024-02-10 ENCOUNTER — Other Ambulatory Visit: Payer: Self-pay | Admitting: Family Medicine

## 2024-02-10 ENCOUNTER — Telehealth: Payer: Self-pay | Admitting: Sports Medicine

## 2024-02-10 DIAGNOSIS — R209 Unspecified disturbances of skin sensation: Secondary | ICD-10-CM

## 2024-02-10 NOTE — Progress Notes (Signed)
 No orders of the defined types were placed in this encounter.

## 2024-02-10 NOTE — Telephone Encounter (Signed)
 Okay.  Orders for ABIs placed.  I am ordering this from home so we will need to go under the orders tab and printed it and have it scheduled either through the referral team or [facility].

## 2024-02-10 NOTE — Telephone Encounter (Signed)
 Copied from CRM #[id number]. Topic: Referral - Request for Referral >> Feb 10, 2024  9:02 AM Yolanda Fields ORN wrote: Did the patient discuss referral with their provider in the last year? Yes (If No - schedule appointment) (If Yes - send message)  Appointment offered? No  Type of order/referral and detailed reason for visit: referral to see a vasular test for blood flow to legs and feet   , patient's foot specialist Yolanda Fields he is with mothershed want patient to get test done reason her  toes are cold   Preference of office, provider, location: N/A  If referral order, have you been seen by this specialty before? No  (If Yes, this issue or another issue? When? Where?  Can we respond through MyChart? No

## 2024-02-11 ENCOUNTER — Other Ambulatory Visit: Payer: Self-pay

## 2024-02-11 DIAGNOSIS — R209 Unspecified disturbances of skin sensation: Secondary | ICD-10-CM

## 2024-02-15 DIAGNOSIS — L814 Other melanin hyperpigmentation: Secondary | ICD-10-CM | POA: Diagnosis not present

## 2024-02-15 DIAGNOSIS — Z85828 Personal history of other malignant neoplasm of skin: Secondary | ICD-10-CM | POA: Diagnosis not present

## 2024-02-15 DIAGNOSIS — L2089 Other atopic dermatitis: Secondary | ICD-10-CM | POA: Diagnosis not present

## 2024-02-15 DIAGNOSIS — D1801 Hemangioma of skin and subcutaneous tissue: Secondary | ICD-10-CM | POA: Diagnosis not present

## 2024-02-17 ENCOUNTER — Ambulatory Visit (HOSPITAL_COMMUNITY)
Admission: RE | Admit: 2024-02-17 | Discharge: 2024-02-17 | Disposition: A | Discharge status: Home / Self Care | Source: Ambulatory Visit | Attending: Family Medicine | Admitting: Family Medicine

## 2024-02-17 ENCOUNTER — Ambulatory Visit: Payer: Self-pay | Admitting: Family Medicine

## 2024-02-17 DIAGNOSIS — R209 Unspecified disturbances of skin sensation: Secondary | ICD-10-CM | POA: Insufficient documentation

## 2024-02-17 LAB — VAS US ABI WITH/WO TBI
Left ABI: 1.18
Right ABI: 1.15

## 2024-02-17 NOTE — Progress Notes (Signed)
 Call patient: We got her vascular studies on her legs back.  Is a little bit of decreased blood flow towards the toes but the great news is there is no major change since 2020 when you had the test done 5 years ago.  The main treatment is just continue to stay active and walk.  This helps with blood flow.

## 2024-02-23 NOTE — Telephone Encounter (Signed)
 Patient referral was sent to [facility] specialists. Appointment was scheduled and completed on 02/17/2024.

## 2024-03-06 DIAGNOSIS — R92323 Mammographic fibroglandular density, bilateral breasts: Secondary | ICD-10-CM | POA: Diagnosis not present

## 2024-03-06 DIAGNOSIS — Z1231 Encounter for screening mammogram for malignant neoplasm of breast: Secondary | ICD-10-CM | POA: Diagnosis not present

## 2024-03-06 LAB — HM MAMMOGRAPHY

## 2024-03-09 ENCOUNTER — Encounter: Payer: Self-pay | Admitting: Family Medicine

## 2024-03-17 DIAGNOSIS — H7201 Central perforation of tympanic membrane, right ear: Secondary | ICD-10-CM | POA: Diagnosis not present

## 2024-03-17 DIAGNOSIS — T161XXA Foreign body in right ear, initial encounter: Secondary | ICD-10-CM | POA: Diagnosis not present

## 2024-03-17 DIAGNOSIS — R42 Dizziness and giddiness: Secondary | ICD-10-CM | POA: Diagnosis not present

## 2024-03-17 DIAGNOSIS — H903 Sensorineural hearing loss, bilateral: Secondary | ICD-10-CM | POA: Diagnosis not present

## 2024-03-23 ENCOUNTER — Encounter: Payer: Self-pay | Admitting: Sports Medicine

## 2024-04-01 ENCOUNTER — Other Ambulatory Visit: Payer: Self-pay | Admitting: Family Medicine

## 2024-04-01 DIAGNOSIS — E1159 Type 2 diabetes mellitus with other circulatory complications: Secondary | ICD-10-CM

## 2024-04-12 NOTE — Progress Notes (Signed)
 Yolanda Fields                                          MRN: [id number]   04/12/2024   The [facility] Quality Team Specialist reviewed this patient medical record for the purposes of chart review for care gap closure. The following were reviewed: chart review for care gap closure-kidney health evaluation for diabetes:eGFR  and uACR.    [facility] Quality Team

## 2024-04-24 ENCOUNTER — Other Ambulatory Visit: Payer: Self-pay | Admitting: Family Medicine

## 2024-04-25 DIAGNOSIS — I10 Essential (primary) hypertension: Secondary | ICD-10-CM | POA: Diagnosis not present

## 2024-04-25 DIAGNOSIS — M79661 Pain in right lower leg: Secondary | ICD-10-CM | POA: Diagnosis not present

## 2024-04-25 DIAGNOSIS — M79662 Pain in left lower leg: Secondary | ICD-10-CM | POA: Diagnosis not present

## 2024-04-25 DIAGNOSIS — R0989 Other specified symptoms and signs involving the circulatory and respiratory systems: Secondary | ICD-10-CM | POA: Diagnosis not present

## 2024-04-27 ENCOUNTER — Other Ambulatory Visit: Payer: Self-pay | Admitting: Family Medicine

## 2024-05-04 DIAGNOSIS — Z794 Long term (current) use of insulin: Secondary | ICD-10-CM | POA: Diagnosis not present

## 2024-05-04 DIAGNOSIS — M2042 Other hammer toe(s) (acquired), left foot: Secondary | ICD-10-CM | POA: Diagnosis not present

## 2024-05-04 DIAGNOSIS — M2041 Other hammer toe(s) (acquired), right foot: Secondary | ICD-10-CM | POA: Diagnosis not present

## 2024-05-04 DIAGNOSIS — E1151 Type 2 diabetes mellitus with diabetic peripheral angiopathy without gangrene: Secondary | ICD-10-CM | POA: Diagnosis not present

## 2024-05-04 DIAGNOSIS — L602 Onychogryphosis: Secondary | ICD-10-CM | POA: Diagnosis not present

## 2024-05-12 NOTE — Progress Notes (Unsigned)
   05/12/2024  Patient ID: Yolanda Fields, female   DOB: 03/08/1946, 78 y.o.   MRN: [id number]  This patient is appearing on a report for being at risk of failing the adherence measure for hypertension (ACEi/ARB) medications this calendar year.   Medication: Losartan  100 mg tablets Last fill date: 04/27/24 for 90 day supply  Insurance report was not up to date. No action needed at this time.   Kristina Bertone C. Aashi Derrington [facility] PharmD Candidate Class of [phone]

## 2024-06-05 ENCOUNTER — Ambulatory Visit: Admitting: Family Medicine

## 2024-06-05 ENCOUNTER — Encounter: Payer: Self-pay | Admitting: Family Medicine

## 2024-06-05 ENCOUNTER — Ambulatory Visit: Payer: Self-pay | Admitting: Family Medicine

## 2024-06-05 VITALS — BP 125/65 | HR 98 | Ht 60.0 in | Wt 122.1 lb

## 2024-06-05 DIAGNOSIS — L03114 Cellulitis of left upper limb: Secondary | ICD-10-CM

## 2024-06-05 DIAGNOSIS — E1159 Type 2 diabetes mellitus with other circulatory complications: Secondary | ICD-10-CM

## 2024-06-05 DIAGNOSIS — N1831 Chronic kidney disease, stage 3a: Secondary | ICD-10-CM

## 2024-06-05 DIAGNOSIS — I10 Essential (primary) hypertension: Secondary | ICD-10-CM

## 2024-06-05 DIAGNOSIS — M545 Low back pain, unspecified: Secondary | ICD-10-CM

## 2024-06-05 DIAGNOSIS — R8290 Unspecified abnormal findings in urine: Secondary | ICD-10-CM

## 2024-06-05 DIAGNOSIS — E038 Other specified hypothyroidism: Secondary | ICD-10-CM

## 2024-06-05 DIAGNOSIS — I739 Peripheral vascular disease, unspecified: Secondary | ICD-10-CM | POA: Insufficient documentation

## 2024-06-05 DIAGNOSIS — Z7984 Long term (current) use of oral hypoglycemic drugs: Secondary | ICD-10-CM

## 2024-06-05 LAB — POCT GLYCOSYLATED HEMOGLOBIN (HGB A1C): Hemoglobin A1C: 6.9 % — AB (ref 4.0–5.6)

## 2024-06-05 LAB — POCT URINALYSIS DIP (CLINITEK)
Bilirubin, UA: NEGATIVE
Blood, UA: NEGATIVE
Glucose, UA: 500 mg/dL — AB
Ketones, POC UA: NEGATIVE mg/dL
Leukocytes, UA: NEGATIVE
Nitrite, UA: NEGATIVE
POC PROTEIN,UA: NEGATIVE
Spec Grav, UA: <=1.005 — AB (ref 1.010–1.025)
Urobilinogen, UA: 0.2 U/dL
pH, UA: 5.5 (ref 5.0–8.0)

## 2024-06-05 LAB — POCT UA - MICROALBUMIN
Creatinine, POC: 50 mg/dL
Microalbumin Ur, POC: 10 mg/L

## 2024-06-05 MED ORDER — DOXYCYCLINE HYCLATE 100 MG PO TABS: 100.0000 mg | ORAL_TABLET | Freq: Two times a day (BID) | ORAL | 0 refills | Status: AC

## 2024-06-05 NOTE — Progress Notes (Signed)
 Established Patient Office Visit  Patient ID: Yolanda Fields, female    DOB: 1945-10-09  Age: 78 y.o. MRN: [id number] PCP: Alvan Dorothyann BIRCH, MD  Chief Complaint  Patient presents with   Diabetes    Subjective:     HPI  Discussed the use of AI scribe software for clinical note transcription with the patient, who gave verbal consent to proceed.  History of Present Illness Yolanda Fields is an 78 year old female with diabetes and hypertension who presents with weight loss and concerns about blood sugar management.  Unintentional weight loss and appetite changes - Weight decreased from 126 pounds in July to 121 pounds currently - Attributes weight loss to decreased appetite from Mounjaro  - Does not eat much after 4 PM  Glycemic control - Closely monitors blood glucose levels - Blood glucose was 135 mg/dL at 10 PM, dropped to 887 mg/dL overnight - Hemoglobin A1c increased from 6.7 to 6.9 - Expresses concern about blood sugar management  Hypertension and orthostatic symptoms - Takes losartan  50 mg for blood pressure control - Monitors blood pressure at home - Occasional dizziness and low blood pressure - Sometimes takes half a tablet of losartan  if blood pressure feels high - No significant falls  Postherpetic neuralgia and recurrent herpes zoster - History of shingles with residual scarring on foot - Occasional stinging pain in left shoulder and arm - Multiple recurrences of shingles despite three doses of shingles vaccine  Dermatologic injury - Crack between fourth and fifth digits of left hand from kitten scratch over a week ago from cat scratch.  - Area remains slightly sore and red - Uses peroxide for wound care  Urinary symptoms - Occasional upper back pain and malodorous urine when fluid intake is low - No current urinary pain or odor     ROS    Objective:     BP 125/65   Pulse 98   Ht 5' (1.524 m)   Wt 122 lb 1.9 oz (55.4 kg)   SpO2 98%   BMI 23.85  kg/m    Physical Exam Vitals and nursing note reviewed.  Constitutional:      Appearance: Normal appearance.  HENT:     Head: Normocephalic and atraumatic.  Eyes:     Conjunctiva/sclera: Conjunctivae normal.  Cardiovascular:     Rate and Rhythm: Normal rate and regular rhythm.  Pulmonary:     Effort: Pulmonary effort is normal.     Breath sounds: Normal breath sounds.  Skin:    General: Skin is warm and dry.  Neurological:     Mental Status: She is alert.  Psychiatric:        Mood and Affect: Mood normal.      Results for orders placed or performed in visit on 06/05/24  POCT HgB A1C  Result Value Ref Range   Hemoglobin A1C 6.9 (A) 4.0 - 5.6 %   HbA1c POC (<> result, manual entry)     HbA1c, POC (prediabetic range)     HbA1c, POC (controlled diabetic range)    POCT URINALYSIS DIP (CLINITEK)  Result Value Ref Range   Color, UA yellow yellow   Clarity, UA clear clear   Glucose, UA =500 (A) negative mg/dL   Bilirubin, UA negative negative   Ketones, POC UA negative negative mg/dL   Spec Grav, UA <=8.994 (A) 1.010 - 1.025   Blood, UA negative negative   pH, UA 5.5 5.0 - 8.0   POC PROTEIN,UA negative negative, trace  Urobilinogen, UA 0.2 0.2 or 1.0 E.U./dL   Nitrite, UA Negative Negative   Leukocytes, UA Negative Negative  POCT UA - Microalbumin  Result Value Ref Range   Microalbumin Ur, POC 10 mg/L   Creatinine, POC 50 mg/dL   Albumin/Creatinine Ratio, Urine, POC 30-300       The ASCVD Risk score (Arnett DK, et al., 2019) failed to calculate for the following reasons:   The 2019 ASCVD risk score is only valid for ages 71 to 84    Assessment & Plan:   Problem List Items Addressed This Visit       Cardiovascular and Mediastinum   Type 2 diabetes mellitus with vascular disease (HCC) - Primary   Type 2 diabetes mellitus Weight loss from 126 lbs to 121 lbs. A1c improved to 6.9. Mounjaro  may reduce appetite. - Continue Mounjaro . - Encouraged small,  frequent protein-rich meals. - Ordered blood work for electrolytes and thyroid .      Relevant Orders   POCT HgB A1C (Completed)   POCT URINALYSIS DIP (CLINITEK) (Completed)   POCT UA - Microalbumin (Completed)   CMP14+EGFR   Lipid panel   TSH   PVD (peripheral vascular disease)   Relevant Orders   CMP14+EGFR   Lipid panel   TSH   Essential hypertension   Essential hypertension Occasional dizziness, low blood pressure. Currently taking losartan  50 mg, considering 25 mg if continues but for now we will stop amlodipine  instead. - Ordered blood work for electrolytes and thyroid . - Stop amlodipine  5 mg daily - Consider reducing losartan  to 25 mg if low BP persists.      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 Swelling present, no exudate. - Prescribe doxycycline . - Advised against peroxide or alcohol use. - Instructed to wash with soap and water, apply Vaseline.  Peripheral vascular disease Stable blood flow to toes compared to 2020.  Postherpetic neuralgia, left upper limb Residual stinging and itching, no active shingles. - Recommended Shingrix vaccine one year post-outbreak.  Abnormal urine odor-urinalysis looked good today no sign of UTI no dysuria.  She has been getting some intermittent mid back pain as well but it seems to have  resolved at the moment.  Consider further workup for her back if pain returns.  General Health Maintenance Discussion on diet and weight management. - Encouraged balanced diet with adequate protein. - Recommended Shingrix vaccine for shingles prevention.    Return in about 4 months (around 10/03/2024) for Diabetes follow-up, Hypertension.    Dorothyann Byars, MD [facility] at [facility]

## 2024-06-05 NOTE — Patient Instructions (Signed)
 Please stop your amlodipine  for BP completely.   If stil getting blood pressure under 118 then let me know.

## 2024-06-05 NOTE — Assessment & Plan Note (Addendum)
 Essential hypertension Occasional dizziness, low blood pressure. Currently taking losartan  50 mg, considering 25 mg if continues but for now we will stop amlodipine  instead. - Ordered blood work for electrolytes and thyroid . - Stop amlodipine  5 mg daily - Consider reducing losartan  to 25 mg if low BP persists.

## 2024-06-05 NOTE — Assessment & Plan Note (Signed)
 Recheck TSH today with weight loss ( though could be from the Mounjaro  as well)

## 2024-06-05 NOTE — Assessment & Plan Note (Signed)
 Type 2 diabetes mellitus Weight loss from 126 lbs to 121 lbs. A1c improved to 6.9. Mounjaro  may reduce appetite. - Continue Mounjaro . - Encouraged small, frequent protein-rich meals. - Ordered blood work for electrolytes and thyroid .

## 2024-06-06 ENCOUNTER — Other Ambulatory Visit: Payer: Self-pay | Admitting: Family Medicine

## 2024-06-06 DIAGNOSIS — E038 Other specified hypothyroidism: Secondary | ICD-10-CM

## 2024-06-06 LAB — LIPID PANEL
Chol/HDL Ratio: 3 ratio (ref 0.0–4.4)
Cholesterol, Total: 125 mg/dL (ref 100–199)
HDL: 42 mg/dL (ref >39)
LDL Chol Calc (NIH): 55 mg/dL (ref 0–99)
Triglycerides: 165 mg/dL — ABNORMAL HIGH (ref 0–149)
VLDL Cholesterol Cal: 28 mg/dL (ref 5–40)

## 2024-06-06 LAB — CMP14+EGFR
ALT: 12 IU/L (ref 0–32)
AST: 16 IU/L (ref 0–40)
Albumin: 4.3 g/dL (ref 3.8–4.8)
Alkaline Phosphatase: 73 IU/L (ref 49–135)
BUN/Creatinine Ratio: 22 (ref 12–28)
BUN: 26 mg/dL (ref 8–27)
Bilirubin Total: 1.4 mg/dL — ABNORMAL HIGH (ref 0.0–1.2)
CO2: 23 mmol/L (ref 20–29)
Calcium: 10.5 mg/dL — ABNORMAL HIGH (ref 8.7–10.3)
Chloride: 99 mmol/L (ref 96–106)
Creatinine, Ser: 1.2 mg/dL — ABNORMAL HIGH (ref 0.57–1.00)
Globulin, Total: 1.7 g/dL (ref 1.5–4.5)
Glucose: 220 mg/dL — ABNORMAL HIGH (ref 70–99)
Potassium: 4.5 mmol/L (ref 3.5–5.2)
Sodium: 138 mmol/L (ref 134–144)
Total Protein: 6 g/dL (ref 6.0–8.5)
eGFR: 46 mL/min/1.73 — ABNORMAL LOW (ref >59)

## 2024-06-06 LAB — TSH: TSH: 0.456 u[IU]/mL (ref 0.450–4.500)

## 2024-06-06 NOTE — Progress Notes (Signed)
 Call patient: Kidney function is around 1.2 she tends to bounce between 1.0 and 1.2.  Just make sure you are hydrating well.  Calcium  level slightly elevated but if she is taking a supplement then that would explain it.  Liver enzymes are normal.  Triglycerides are still up slightly but LDL looks good.  Thyroid  level also has shifted slightly.  I would like for her to take a half a tab on Sundays and a whole tab the other 6 days of the week and then repeat this pattern.  Plan to recheck TSH in 6 to 8 weeks.

## 2024-06-06 NOTE — Progress Notes (Signed)
 Please let her know that in looking back over the last couple of years her creatinine tends to bounce between 1.0 and at the most 1.2 it had actually jumped to 1.2 in January but then went back down so we can always plan to recheck it again sooner in about 8 weeks and we can recheck the calcium  in the thyroid  at that time as well.  We can also put a B12 on there just to make sure that that is adequate as well.

## 2024-07-16 ENCOUNTER — Other Ambulatory Visit: Payer: Self-pay | Admitting: Family Medicine

## 2024-07-30 ENCOUNTER — Other Ambulatory Visit: Payer: Self-pay | Admitting: Family Medicine

## 2024-07-30 DIAGNOSIS — E1129 Type 2 diabetes mellitus with other diabetic kidney complication: Secondary | ICD-10-CM

## 2024-08-15 ENCOUNTER — Ambulatory Visit

## 2024-08-19 LAB — BMP8+EGFR
BUN/Creatinine Ratio: 20 (ref 12–28)
BUN: 25 mg/dL (ref 8–27)
CO2: 25 mmol/L (ref 20–29)
Calcium: 11.4 mg/dL — ABNORMAL HIGH (ref 8.7–10.3)
Chloride: 99 mmol/L (ref 96–106)
Creatinine, Ser: 1.28 mg/dL — ABNORMAL HIGH (ref 0.57–1.00)
Glucose: 173 mg/dL — ABNORMAL HIGH (ref 70–99)
Potassium: 4.4 mmol/L (ref 3.5–5.2)
Sodium: 140 mmol/L (ref 134–144)
eGFR: 42 mL/min/{1.73_m2} — ABNORMAL LOW

## 2024-08-19 LAB — TSH: TSH: 5.47 u[IU]/mL — ABNORMAL HIGH (ref 0.450–4.500)

## 2024-08-21 MED ORDER — LEVOTHYROXINE SODIUM 100 MCG PO TABS: 100.0000 ug | ORAL_TABLET | ORAL | 1 refills | Status: AC

## 2024-08-21 NOTE — Addendum Note (Signed)
 Addended by: Nevena Rozenberg D on: 08/21/2024 10:48 AM   Modules accepted: Orders

## 2024-08-21 NOTE — Progress Notes (Signed)
 HI Yolanda Fields, your kidney function is stable from 2 months ago but still up a little. Are you hydrating well?  You still look a little dry on labs. Your thyroid  is no longer low but now is high.  Instead of half a tab on Sunday I am going to send over a rx for 100mcg on Sundays and can still take the 125 Mon through Saturdays and then we can recheck TSH In 8 weeks.

## 2024-08-23 NOTE — Addendum Note (Signed)
 Addended by: Yusuke Beza P on: 08/23/2024 11:13 AM   Modules accepted: Orders

## 2024-08-24 ENCOUNTER — Encounter: Payer: Self-pay | Admitting: Family Medicine

## 2024-08-24 NOTE — Telephone Encounter (Signed)
 Rollator has been ordered via parachute.

## 2024-08-24 NOTE — Telephone Encounter (Signed)
 Ok for rx for Rollator

## 2024-08-29 ENCOUNTER — Ambulatory Visit

## 2024-09-19 ENCOUNTER — Ambulatory Visit

## 2024-10-03 ENCOUNTER — Ambulatory Visit: Admitting: Family Medicine
